# Patient Record
Sex: Male | Born: 1950 | Race: White | Hispanic: No | Marital: Married | Smoking: Former smoker
Health system: Southern US, Community
[De-identification: ages and names within clinical notes are randomized; demographics above are authoritative.]

## PROBLEM LIST (undated history)

## (undated) DIAGNOSIS — J4520 Mild intermittent asthma, uncomplicated: Secondary | ICD-10-CM

## (undated) DIAGNOSIS — Z87891 Personal history of nicotine dependence: Secondary | ICD-10-CM

## (undated) DIAGNOSIS — R0600 Dyspnea, unspecified: Secondary | ICD-10-CM

## (undated) DIAGNOSIS — M1990 Unspecified osteoarthritis, unspecified site: Secondary | ICD-10-CM

## (undated) DIAGNOSIS — R0609 Other forms of dyspnea: Secondary | ICD-10-CM

## (undated) DIAGNOSIS — E559 Vitamin D deficiency, unspecified: Secondary | ICD-10-CM

## (undated) DIAGNOSIS — I2510 Atherosclerotic heart disease of native coronary artery without angina pectoris: Secondary | ICD-10-CM

## (undated) DIAGNOSIS — E785 Hyperlipidemia, unspecified: Secondary | ICD-10-CM

## (undated) DIAGNOSIS — N138 Other obstructive and reflux uropathy: Secondary | ICD-10-CM

## (undated) DIAGNOSIS — N401 Enlarged prostate with lower urinary tract symptoms: Secondary | ICD-10-CM

## (undated) DIAGNOSIS — Z8679 Personal history of other diseases of the circulatory system: Secondary | ICD-10-CM

## (undated) DIAGNOSIS — N529 Male erectile dysfunction, unspecified: Secondary | ICD-10-CM

## (undated) DIAGNOSIS — I739 Peripheral vascular disease, unspecified: Secondary | ICD-10-CM

## (undated) DIAGNOSIS — I493 Ventricular premature depolarization: Secondary | ICD-10-CM

## (undated) DIAGNOSIS — I10 Essential (primary) hypertension: Secondary | ICD-10-CM

## (undated) DIAGNOSIS — K219 Gastro-esophageal reflux disease without esophagitis: Secondary | ICD-10-CM

## (undated) DIAGNOSIS — I255 Ischemic cardiomyopathy: Secondary | ICD-10-CM

## (undated) DIAGNOSIS — I700 Atherosclerosis of aorta: Secondary | ICD-10-CM

## (undated) DIAGNOSIS — F1011 Alcohol abuse, in remission: Secondary | ICD-10-CM

## (undated) HISTORY — DX: Essential (primary) hypertension: I10

## (undated) HISTORY — PX: TONSILLECTOMY: SUR1361

---

## 2010-04-15 DIAGNOSIS — Z7189 Other specified counseling: Secondary | ICD-10-CM | POA: Insufficient documentation

## 2012-10-04 ENCOUNTER — Ambulatory Visit: Payer: Self-pay | Admitting: Gastroenterology

## 2013-09-27 DIAGNOSIS — N529 Male erectile dysfunction, unspecified: Secondary | ICD-10-CM | POA: Insufficient documentation

## 2013-09-30 DIAGNOSIS — E559 Vitamin D deficiency, unspecified: Secondary | ICD-10-CM | POA: Insufficient documentation

## 2013-09-30 DIAGNOSIS — I10 Essential (primary) hypertension: Secondary | ICD-10-CM | POA: Insufficient documentation

## 2013-09-30 DIAGNOSIS — F1010 Alcohol abuse, uncomplicated: Secondary | ICD-10-CM | POA: Insufficient documentation

## 2013-09-30 DIAGNOSIS — E785 Hyperlipidemia, unspecified: Secondary | ICD-10-CM | POA: Insufficient documentation

## 2013-09-30 DIAGNOSIS — Z720 Tobacco use: Secondary | ICD-10-CM | POA: Insufficient documentation

## 2019-07-05 ENCOUNTER — Ambulatory Visit: Payer: Medicare HMO | Admitting: Urology

## 2019-08-02 ENCOUNTER — Encounter: Payer: Self-pay | Admitting: Urology

## 2019-08-02 ENCOUNTER — Ambulatory Visit: Payer: Medicare HMO | Admitting: Urology

## 2019-08-02 ENCOUNTER — Other Ambulatory Visit: Payer: Self-pay

## 2019-08-02 VITALS — BP 136/68 | HR 94 | Ht 72.0 in | Wt 217.0 lb

## 2019-08-02 DIAGNOSIS — R3912 Poor urinary stream: Secondary | ICD-10-CM

## 2019-08-02 DIAGNOSIS — N400 Enlarged prostate without lower urinary tract symptoms: Secondary | ICD-10-CM | POA: Diagnosis not present

## 2019-08-02 LAB — URINALYSIS, COMPLETE
Bilirubin, UA: NEGATIVE
Leukocytes,UA: NEGATIVE
Nitrite, UA: NEGATIVE
RBC, UA: NEGATIVE
Specific Gravity, UA: >1.03 — ABNORMAL HIGH (ref 1.005–1.030)
Urobilinogen, Ur: 1 mg/dL (ref 0.2–1.0)
pH, UA: 6 (ref 5.0–7.5)

## 2019-08-02 LAB — MICROSCOPIC EXAMINATION
Bacteria, UA: NONE SEEN
RBC, Urine: NONE SEEN /hpf (ref 0–2)

## 2019-08-02 LAB — BLADDER SCAN AMB NON-IMAGING: Scan Result: 0

## 2019-08-02 MED ORDER — TAMSULOSIN HCL 0.4 MG PO CAPS: 0.4000 mg | ORAL_CAPSULE | Freq: Every day | ORAL | 11 refills | Status: DC

## 2019-08-02 NOTE — Progress Notes (Signed)
08/02/2019 3:39 PM   Christian Pena [birth year] [id number]  Referring provider: Sallee Lange, NP [address] [place],  [place] [address]  Chief Complaint  Patient presents with  . Urinary Frequency    HPI: 69 year old male who presents today for further evaluation of voiding symptoms.  He reports over the past several years, he is having increasing difficulty with weak stream, slight straining with urination.  Gets up twice at night to void.  He does feel like he empties his bladder for the most part despite having to strain with weak flow.  No urinary tract infections.  No dysuria or gross hematuria.  He has discussed this with his primary care physician but never has been started on pharmacotherapy.  No history of urinary retention.  Most recent PSA 0.96 on 07/06/2018.  No PSAs in the past 12 months.  Urinalysis today is unremarkable.   IPSS    Row Name 08/02/19 1300         International Prostate Symptom Score   How often have you had the sensation of not emptying your bladder?  Less than 1 in 5     How often have you had to urinate less than every two hours?  Less than half the time     How often have you found you stopped and started again several times when you urinated?  Less than 1 in 5 times     How often have you found it difficult to postpone urination?  Not at All     How often have you had a weak urinary stream?  Almost always     How often have you had to strain to start urination?  Not at All     How many times did you typically get up at night to urinate?  2 Times     Total IPSS Score  11       Quality of Life due to urinary symptoms   If you were to spend the rest of your life with your urinary condition just the way it is now how would you feel about that?  Mostly Satisfied        Score:  1-7 Mild 8-19 Moderate 20-35 Severe   PMH: Past Medical History:  Diagnosis Date  . Hypertension     Surgical History: Past Surgical History:   Procedure Laterality Date  . NO PAST SURGERIES      Home Medications:  Allergies as of 08/02/2019   No Known Allergies     Medication List       Accurate as of August 02, 2019  3:39 PM. If you have any questions, ask your nurse or doctor.        aspirin 81 MG EC tablet Take by mouth.   lisinopril 20 MG tablet Commonly known as: ZESTRIL Take by mouth.   omeprazole 20 MG tablet Commonly known as: PRILOSEC OTC Take by mouth.   sildenafil 20 MG tablet Commonly known as: REVATIO Take by mouth.   tamsulosin 0.4 MG Caps capsule Commonly known as: Flomax Take 1 capsule (0.4 mg total) by mouth daily. Started by: Hollice Espy, MD       Allergies: No Known Allergies  Family History: History reviewed. No pertinent family history.  Social History:  reports that he has been smoking cigarettes. He has never used smokeless tobacco. He reports current alcohol use. No history on file for drug.  ROS: UROLOGY Frequent Urination?: No Hard to postpone urination?: No  Burning/pain with urination?: No Get up at night to urinate?: No Leakage of urine?: No Urine stream starts and stops?: No Trouble starting stream?: No Do you have to strain to urinate?: No Blood in urine?: No Urinary tract infection?: No Sexually transmitted disease?: No Injury to kidneys or bladder?: No Painful intercourse?: No Weak stream?: Yes Erection problems?: No Penile pain?: No  Gastrointestinal Nausea?: No Vomiting?: No Indigestion/heartburn?: No Diarrhea?: No Constipation?: No  Constitutional Fever: No Night sweats?: No Weight loss?: No Fatigue?: No  Skin Skin rash/lesions?: No Itching?: No  Eyes Blurred vision?: No Double vision?: No  Ears/Nose/Throat Sore throat?: No Sinus problems?: No  Hematologic/Lymphatic Swollen glands?: No Easy bruising?: No  Cardiovascular Leg swelling?: No Chest pain?: No  Respiratory Cough?: No Shortness of breath?:  No  Endocrine Excessive thirst?: No  Musculoskeletal Back pain?: No Joint pain?: No  Neurological Headaches?: No Dizziness?: No  Psychologic Depression?: No Anxiety?: No  Physical Exam: BP 136/68   Pulse 94   Ht 6' (1.829 m)   Wt 217 lb (98.4 kg)   BMI 29.43 kg/m   Constitutional:  Alert and oriented, No acute distress. HEENT: [place] AT, moist mucus membranes.  Trachea midline, no masses. Cardiovascular: No clubbing, cyanosis, or edema. Respiratory: Normal respiratory effort, no increased work of breathing. Rectal: Normal sphincter tone.  Relatively short prostate with large lateral lobes, 50+ cc gland.  No nodules.  No tenderness. Skin: No rashes, bruises or suspicious lesions. Neurologic: Grossly intact, no focal deficits, moving all 4 extremities. Psychiatric: Normal mood and affect.  Laboratory Data: PSA as above.  Creatinine 0.9 on 1/21.   Urinalysis UA today reviewed, unremarkable.  See epic.  Pertinent Imaging: Results for orders placed or performed in visit on 08/02/19  Bladder Scan (Post Void Residual) in office  Result Value Ref Range   Scan Result 0     Assessment & Plan:    1. Weak urinary stream AUA prostate cancer screening guidelines and recommendations reviewed, updated today with repeat PSA is pending.  Patient is adequately bothered by his weakening urinary stream consistent with bladder outlet obstruction, chronic and worsening  We discussed options including continued conservative management, pharmacotherapy including medication such as Flomax and finasteride along with outlet procedures at length.  Risk and benefits of each were discussed.  He is leaning toward surgical intervention but is hesitant to want to pursue this at this time.  He was given literature today on both TURP, UroLift, and holmium laser enucleation of the prostate is representative examples of outlet procedures today.  He will start thinking about this and received  literature.  If he wants to pursue any sort of outlet procedure, would recommend cystoscopy and transrectal sizing for more accurate counseling.  It is my time, he is interested in pursuing pharmacotherapy as a trial.  He like to try Flomax initially.  Possible side effects including orthostatic hypotension retrograde ejaculation amongst others were discussed.  Medication was sent to pharmacy.  We will plan to reassess his symptoms in 3 months.  He is agreeable this plan.  - Urinalysis, Complete - Bladder Scan (Post Void Residual) in office   2. 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  Hollice Espy, MD  [facility] [address] [place], [place] [address] [phone]

## 2019-08-02 NOTE — Patient Instructions (Signed)
Holmium Laser Enucleation of the Prostate (HoLEP)  HoLEP is a treatment for men with benign prostatic hyperplasia (BPH). The laser surgery removed blockages of urine flow, and is done without any incisions on the body.     What is HoLEP?  HoLEP is a type of laser surgery used to treat obstruction (blockage) of urine flow as a result of benign prostatic hyperplasia (BPH). In men with BPH, the prostate gland is not cancerous, but has become enlarged. An enlarged prostate can result in a number of urinary tract symptoms such as weak urinary stream, difficulty in starting urination, inability to urinate, frequent urination, or getting up at night to urinate.  HoLEP was developed in the 1990's as a more effective and less expensive surgical option for BPH, compared to other surgical options such as laser vaporization(PVP/greenlight laser), transurethral resection of the prostate(TURP), and open simple prostatectomy.   What happens during a HoLEP?  HoLEP requires general anesthesia ("asleep" throughout the procedure).   An antibiotic is given to reduce the risk of infection  A surgical instrument called a resectoscope is inserted through the urethra (the tube that carries urine from the bladder). The resectoscope has a camera that allows the surgeon to view the internal structure of the prostate gland, and to see where the incisions are being made during surgery.  The laser is inserted into the resectoscope and is used to enucleate (free up) the enlarged prostate tissue from the capsule (outer shell) and then to seal up any blood vessels. The tissue that has been removed is pushed back into the bladder.  A morcellator is placed through the resectoscope, and is used to suction out the prostate tissue that has been pushed into the bladder.  When the prostate tissue has been removed, the resectoscope is removed, and a foley catheter is placed to allow healing and drain the urine from the  bladder.     What happens after a HoLEP?  More than 90% of patients go home the same day a few hours after surgery. Less than 10% will be admitted to the hospital overnight for observation to monitor the urine, or if they have other medical problems.  Fluid is flushed through the catheter for about 1 hour after surgery to clear any blood from the urine. It is normal to have some blood in the urine after surgery. The need for blood transfusion is extremely rare.  Eating and drinking are permitted after the procedure once the patient has fully awakened from anesthesia.  The catheter is usually removed 2-3 days after surgery- the patient will come to clinic to have the catheter removed and make sure they can urinate on their own.  It is very important to drink lots of fluids after surgery for one week to keep the bladder flushed.  At first, there may be some burning with urination, but this typically improved within a few hours to days. Most patients do not have a significant amount of pain, and narcotic pain medications are rarely needed.  Symptoms of urinary frequency, urgency, and even leakage are NORMAL for the first few weeks after surgery as the bladder adjusts after having to work hard against blockage from the prostate for many years. This will improve, but can sometimes take several months.  The use of pelvic floor exercises (Kegel exercises) can help improve problems with urinary incontinence.   After catheter removal, patients will be seen at 6 weeks and 6 months for symptom check  No heavy lifting for   at least 2-3 weeks after surgery, however patients can walk and do light activities the first day after surgery. Return to work time depends on occupation.    What are the advantages of HoLEP?  HoLEP has been studied in many different parts of the world and has been shown to be a safe and effective procedure. Although there are many types of BPH surgeries available, HoLEP offers a  unique advantage in being able to remove a large amount of tissue without any incisions on the body, even in very large prostates, while decreasing the risk of bleeding and providing tissue for pathology (to look for cancer). This decreases the need for blood transfusions during surgery, minimizes hospital stay, and reduces the risk of needing repeat treatment.  What are the side effects of HoLEP?  Temporary burning and bleeding during urination. Some blood may be seen in the urine for weeks after surgery and is part of the healing process.  Urinary incontinence (inability to control urine flow) is expected in all patients immediately after surgery and they should wear pads for the first few days/weeks. This typically improves over the course of several weeks. Performing Kegel exercises can help decrease leakage from stress maneuvers such as coughing, sneezing, or lifting. The rate of long term leakage is very low. Patients may also have leakage with urgency and this may be treated with medication. The risk of urge incontinence can be dependent on several factors including age, prostate size, symptoms, and other medical problems.  Retrograde ejaculation or "backwards ejaculation." In 75% of cases, the patient will not see any fluid during ejaculation after surgery.  Erectile function is generally not significantly affected.   What are the risks of HoLEP?  Injury to the urethra or development of scar tissue at a later date  Injury to the capsule of the prostate (typically treated with longer catheterization).  Injury to the bladder or ureteral orifices (where the urine from the kidney drains out)  Infection of the bladder, testes, or kidneys  Return of urinary obstruction at a later date requiring another operation (<2%)  Need for blood transfusion or re-operation due to bleeding  Failure to relieve all symptoms and/or need for prolonged catheterization after surgery  5-15% of patients are  found to have previously undiagnosed prostate cancer in their specimen. Prostate cancer can be treated after HoLEP.  Standard risks of anesthesia including blood clots, heart attacks, etc  When should I call my doctor?  Fever over 101.3 degrees  Inability to urinate, or large blood clots in the urine      Transurethral Resection of the Prostate Transurethral resection of the prostate (TURP) is the removal (resection) of part of the gland that produces semen (prostate gland). This procedure is done to treat benign prostatic hyperplasia (BPH). BPH is an abnormal, noncancerous (benign) increase in the number of cells that make up the prostate tissue. BPH causes the prostate to get bigger. The enlarged prostate can push against or block the tube that drains urine from the bladder out of the body (urethra). BPH can affect normal urine flow by causing bladder infections, difficulty controlling bladder function, and difficulty emptying the bladder.  The goal of TURP is to remove enough prostate tissue to allow for a normal flow of urine. The procedure will allow you to empty your bladder more completely when you urinate so that you can urinate less often. In a transurethral resection, a thin telescope with a light, a tiny camera, and an electric cutting   edge (resectoscope) is passed through the urethra and into the prostate. The opening of the urethra is at the end of the penis. Tell a health care provider about:  Any allergies you have.  All medicines you are taking, including vitamins, herbs, eye drops, creams, and over-the-counter medicines.  Any problems you or family members have had with anesthetic medicines.  Any blood disorders you have.  Any surgeries you have had.  Any medical conditions you have.  Any prostate infections you have had. What are the risks? Generally, this is a safe procedure. However, problems may occur, including:  Infection.  Bleeding.  Allergic reactions  to medicines.  Damage to other structures or organs, such as: ? The urethra. ? The bladder. ? Muscles that surround the prostate.  Difficulty getting an erection.  Inability to control when you urinate (incontinence).  Scarring, which may cause problems with urine flow. What happens before the procedure? Medicines Ask your health care provider about:  Changing or stopping your regular medicines. This is especially important if you are taking diabetes medicines or blood thinners.  Taking medicines such as aspirin and ibuprofen. These medicines can thin your blood. Do not take these medicines unless your health care provider tells you to take them.  Taking over-the-counter medicines, vitamins, herbs, and supplements. Eating and drinking Follow instructions from your health care provider about eating and drinking, which may include:  8 hours before the procedure - stop eating heavy meals or foods, such as meat, fried foods, or fatty foods.  6 hours before the procedure - stop eating light meals or foods, such as toast or cereal.  6 hours before the procedure - stop drinking milk or drinks that contain milk.  2 hours before the procedure - stop drinking clear liquids. Staying hydrated Follow instructions from your health care provider about hydration, which may include:  Up to 2 hours before the procedure - you may continue to drink clear liquids, such as water, clear fruit juice, black coffee, and plain tea. General instructions  You may have a physical exam.  You may have a blood or urine sample taken.  Ask your health care provider what steps will be taken to help prevent infection. These may include: ? Washing skin with a germ-killing soap. ? Taking antibiotic medicine.  Plan to have someone take you home from the hospital or clinic. You may not be able to drive for up to 10 days after your procedure.  Plan to have a responsible adult care for you for at least 24 hours  after you leave the hospital or clinic. This is important. What happens during the procedure?   An IV will be inserted into one of your veins.  You will be given one or more of the following: ? A medicine to help you relax (sedative). ? A medicine to make you fall asleep (general anesthetic). ? A medicine that is injected into your spine to numb the area below and slightly above the injection site (spinal anesthetic).  Your legs will be placed in foot rests (stirrups) so that your legs are apart and your knees are bent.  The resectoscope will be passed through your urethra to your prostate.  Parts of your prostate will be resected using the cutting edge of the resectoscope.  The resectoscope will be removed.  A small, thin tube (catheter) will be passed through your urethra and into your bladder. The catheter will drain urine into a bag outside of your body. ? Fluid   may be passed through the catheter to keep the catheter open. The procedure may vary among health care providers and hospitals. What happens after the procedure?  Your blood pressure, heart rate, breathing rate, and blood oxygen level will be monitored until you leave the hospital or clinic.  You may continue to receive fluids and medicines through an IV.  You may have some pain. Pain medicine will be available to help you.  You will have a catheter draining your urine. ? You may have blood in your urine. Your catheter may be kept in until your urine is clear. ? Your urinary drainage will be monitored. If necessary, your bladder may be rinsed out (irrigated) through your catheter.  You will be encouraged to walk around as soon as possible.  You may have to wear compression stockings. These stockings help prevent blood clots and reduce swelling in your legs.  Do not drive for 24 hours if you were given a sedative during your procedure. Summary  Transurethral resection of the prostate (TURP) is the removal  (resection) of part of the gland that produces semen (prostate gland).  The goal of this procedure is to remove enough prostate tissue to allow for a normal flow of urine.  Follow instructions from your health care provider about taking medicines and about eating and drinking before the procedure. This information is not intended to replace advice given to you by your health care provider. Make sure you discuss any questions you have with your health care provider. Document Revised: 10/20/2018 Document Reviewed: 03/31/2018 Elsevier Patient Education  2020 [employer]  

## 2019-08-03 LAB — PSA: Prostate Specific Ag, Serum: 1.1 ng/mL (ref 0.0–4.0)

## 2019-08-05 ENCOUNTER — Telehealth: Payer: Self-pay | Admitting: *Deleted

## 2019-08-05 NOTE — Telephone Encounter (Addendum)
Patient notified-voiced understanding.   ----- Message from Hollice Espy, MD sent at 08/05/2019 12:20 PM EST ----- PSA 1.1 which is excellent.

## 2019-08-18 DIAGNOSIS — I714 Abdominal aortic aneurysm, without rupture, unspecified: Secondary | ICD-10-CM

## 2019-08-18 HISTORY — DX: Abdominal aortic aneurysm, without rupture, unspecified: I71.40

## 2019-10-31 NOTE — Progress Notes (Signed)
11/01/19 10:17 AM   Christian Pena 1951/03/22 [id number]  Referring provider: Sallee Lange, NP [address] [place],  [place] [address]  Chief Complaint  Patient presents with  . Follow-up    HPI: Christian Pena is a 69 y.o. who returns today for cysto/TRUS for the evaluation and management of BPH w/o urinary obstruction however has opted out of cysto.   On last visit, he reported of having increased difficulty with weak stream, slight straining with urination and nocturia x2. He was started on Flomax at that time.   Today, he reports of doing well symptomatically on Flomax however reports of dizziness when standing up for prolonged periods. He has been taking Flomax during the day.   He would like to defer surgical intervention till later since he has another major surgery coming up.   No hx of urinary retention.   IPSS    Row Name 11/01/19 1100         International Prostate Symptom Score   How often have you had the sensation of not emptying your bladder?  Not at All     How often have you had to urinate less than every two hours?  Less than 1 in 5 times     How often have you found you stopped and started again several times when you urinated?  Less than 1 in 5 times     How often have you found it difficult to postpone urination?  Not at All     How often have you had a weak urinary stream?  Less than 1 in 5 times     How often have you had to strain to start urination?  Not at All     How many times did you typically get up at night to urinate?  None     Total IPSS Score  3       Quality of Life due to urinary symptoms   If you were to spend the rest of your life with your urinary condition just the way it is now how would you feel about that?  Mixed        Score:  1-7 Mild 8-19 Moderate 20-35 Severe  PMH: Past Medical History:  Diagnosis Date  . Hypertension     Surgical History: Past Surgical History:  Procedure Laterality Date  . NO PAST  SURGERIES      Home Medications:  Allergies as of 11/01/2019   No Known Allergies     Medication List       Accurate as of November 01, 2019 11:59 PM. If you have any questions, ask your nurse or doctor.        aspirin 81 MG EC tablet Take by mouth.   lisinopril 20 MG tablet Commonly known as: ZESTRIL Take by mouth.   omeprazole 20 MG tablet Commonly known as: PRILOSEC OTC Take by mouth.   sildenafil 20 MG tablet Commonly known as: REVATIO Take by mouth.   tamsulosin 0.4 MG Caps capsule Commonly known as: Flomax Take 1 capsule (0.4 mg total) by mouth daily.       Allergies: No Known Allergies  Family History: No family history on file.  Social History:  reports that he has been smoking cigarettes. He has never used smokeless tobacco. He reports current alcohol use. No history on file for drug.   Physical Exam: BP 124/70   Pulse 90   Ht 6' (1.829 m)   Wt 217  lb (98.4 kg)   BMI 29.43 kg/m   Constitutional:  Alert and oriented, No acute distress. HEENT: [place] AT, moist mucus membranes.  Trachea midline, no masses. Cardiovascular: No clubbing, cyanosis, or edema. Respiratory: Normal respiratory effort, no increased work of breathing. Skin: No rashes, bruises or suspicious lesions. Neurologic: Grossly intact, no focal deficits, moving all 4 extremities. Psychiatric: Normal mood and affect.  Assessment & Plan:    1. Weak urinary stream  Symptoms well controlled on Flomax Continue Flomax. Possible side effects discussed including orthostatic hypotension retrograde ejaculation amongst others were discussed. Advised to take medication at night. He deferred outlet procedure given another major surgery. Would like to consider UroLift in future.  Return for cysto/TRUS during winter.   2. BPH w/o urinary obstruction  As above   Return for Cysto/trus.  [facility] [address], [place], [place] [address] [phone]  I,  Christian Pena, am acting as a scribe for Dr. Hollice Espy,  I have reviewed the above documentation for accuracy and completeness, and I agree with the above.   Hollice Espy, MD

## 2019-11-01 ENCOUNTER — Encounter: Payer: Self-pay | Admitting: Urology

## 2019-11-01 ENCOUNTER — Other Ambulatory Visit: Payer: Self-pay

## 2019-11-01 ENCOUNTER — Ambulatory Visit (INDEPENDENT_AMBULATORY_CARE_PROVIDER_SITE_OTHER): Payer: Medicare HMO | Admitting: Urology

## 2019-11-01 VITALS — BP 124/70 | HR 90 | Ht 72.0 in | Wt 217.0 lb

## 2019-11-01 DIAGNOSIS — N400 Enlarged prostate without lower urinary tract symptoms: Secondary | ICD-10-CM | POA: Diagnosis not present

## 2020-02-02 ENCOUNTER — Encounter: Payer: Self-pay | Admitting: Emergency Medicine

## 2020-02-02 ENCOUNTER — Emergency Department
Admission: EM | Admit: 2020-02-02 | Discharge: 2020-02-02 | Disposition: A | Discharge status: Home / Self Care | Payer: Medicare HMO | Attending: Emergency Medicine | Admitting: Emergency Medicine

## 2020-02-02 ENCOUNTER — Other Ambulatory Visit: Payer: Self-pay

## 2020-02-02 DIAGNOSIS — W312XXA Contact with powered woodworking and forming machines, initial encounter: Secondary | ICD-10-CM | POA: Diagnosis not present

## 2020-02-02 DIAGNOSIS — Z79899 Other long term (current) drug therapy: Secondary | ICD-10-CM | POA: Insufficient documentation

## 2020-02-02 DIAGNOSIS — F17210 Nicotine dependence, cigarettes, uncomplicated: Secondary | ICD-10-CM | POA: Insufficient documentation

## 2020-02-02 DIAGNOSIS — Y929 Unspecified place or not applicable: Secondary | ICD-10-CM | POA: Diagnosis not present

## 2020-02-02 DIAGNOSIS — Y939 Activity, unspecified: Secondary | ICD-10-CM | POA: Insufficient documentation

## 2020-02-02 DIAGNOSIS — S61315A Laceration without foreign body of left ring finger with damage to nail, initial encounter: Secondary | ICD-10-CM

## 2020-02-02 DIAGNOSIS — Y999 Unspecified external cause status: Secondary | ICD-10-CM | POA: Insufficient documentation

## 2020-02-02 DIAGNOSIS — S61313A Laceration without foreign body of left middle finger with damage to nail, initial encounter: Secondary | ICD-10-CM

## 2020-02-02 DIAGNOSIS — S61213A Laceration without foreign body of left middle finger without damage to nail, initial encounter: Secondary | ICD-10-CM | POA: Diagnosis present

## 2020-02-02 DIAGNOSIS — S61215A Laceration without foreign body of left ring finger without damage to nail, initial encounter: Secondary | ICD-10-CM | POA: Diagnosis not present

## 2020-02-02 DIAGNOSIS — I10 Essential (primary) hypertension: Secondary | ICD-10-CM | POA: Diagnosis not present

## 2020-02-02 MED ORDER — OXYCODONE-ACETAMINOPHEN 5-325 MG PO TABS
1.0000 | ORAL_TABLET | Freq: Once | ORAL | Status: AC
  Administered 2020-02-02: 1 via ORAL
  Filled 2020-02-02: qty 1

## 2020-02-02 MED ORDER — BACITRACIN-NEOMYCIN-POLYMYXIN 400-5-5000 EX OINT
TOPICAL_OINTMENT | Freq: Once | CUTANEOUS | Status: AC
  Administered 2020-02-02: 1 via TOPICAL
  Filled 2020-02-02: qty 1

## 2020-02-02 MED ORDER — LIDOCAINE HCL (PF) 1 % IJ SOLN
10.0000 mL | Freq: Once | INTRAMUSCULAR | Status: AC
  Administered 2020-02-02: 10 mL
  Filled 2020-02-02: qty 10

## 2020-02-02 NOTE — ED Provider Notes (Signed)
[facility] Emergency Department Provider Note   ____________________________________________   First MD Initiated Contact with Patient 02/02/20 1222     (approximate)  I have reviewed the triage vital signs and the nursing notes.   HISTORY  Chief Complaint Laceration    HPI Christian Pena is a 69 y.o. 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  Nail involvement of the fourth digit.  Patient denies loss sensation or loss of function.  Patient tetanus shot is up-to-date.  Patient rates pain as a 5/10.  Patient described the pain as "sore".  Pressure dressing applied in triage.         Past Medical History:  Diagnosis Date  . Hypertension     There are no problems to display for this patient.   Past Surgical History:  Procedure Laterality Date  . NO PAST SURGERIES      Prior to Admission medications   Medication Sig Start Date End Date Taking? Authorizing Provider  aspirin 81 MG EC tablet Take by mouth.    [provider]  lisinopril (ZESTRIL) 20 MG tablet Take by mouth. 02/07/19   [provider]  omeprazole (PRILOSEC OTC) 20 MG tablet Take by mouth.    [provider]  sildenafil (REVATIO) 20 MG tablet Take by mouth. 10/21/18   [provider]  tamsulosin (FLOMAX) 0.4 MG CAPS capsule Take 1 capsule (0.4 mg total) by mouth daily. 08/02/19   Hollice Espy, MD    Allergies Patient has no known allergies.  No family history on file.  Social History Social History   Tobacco Use  . Smoking status: Current Some Day Smoker    Types: Cigarettes  . Smokeless tobacco: Never Used  Substance Use Topics  . Alcohol use: Yes  . Drug use: Not on file    Review of Systems Constitutional: No fever/chills Eyes: No visual changes. ENT: No sore throat. Cardiovascular: Denies chest pain. Respiratory: Denies shortness of breath. Gastrointestinal: No  abdominal pain.  No nausea, no vomiting.  No diarrhea.  No constipation. Genitourinary: Negative for dysuria. Musculoskeletal: Negative for back pain. Skin: Negative for rash.  Laceration distal third and fourth digit left hand. Neurological: Negative for headaches, focal weakness or numbness. Endocrine:  Hypertension Hematological/Lymphatic:  Allergic/Immunilogical: **}  ____________________________________________   PHYSICAL EXAM:  VITAL SIGNS: ED Triage Vitals  Enc Vitals Group     BP 02/02/20 1210 (!) 150/93     Pulse Rate 02/02/20 1210 95     Resp 02/02/20 1210 19     Temp 02/02/20 1210 98.5 F (36.9 C)     Temp Source 02/02/20 1210 Oral     SpO2 02/02/20 1210 97 %     Weight 02/02/20 1206 (!) 216 lb 14.9 oz (98.4 kg)     Height 02/02/20 1206 6' (1.829 m)     Head Circumference --      Peak Flow --      Pain Score 02/02/20 1205 5     Pain Loc --      Pain Edu? --      Excl. in [place]? --    Constitutional: Alert and oriented. Well appearing and in no acute distress. Cardiovascular: Normal rate, regular rhythm. Grossly normal heart sounds.  Good peripheral circulation.  Elevated blood pressure. Respiratory: Normal respiratory effort.  No retractions. Lungs CTAB. Skin: Laceration to the distal third and fourth digit left hand.   Psychiatric: Mood and  affect are normal. Speech and behavior are normal.  ____________________________________________   LABS (all labs ordered are listed, but only abnormal results are displayed)  Labs Reviewed - No data to display ____________________________________________  EKG   ____________________________________________  RADIOLOGY  ED MD interpretation:    Official radiology report(s): No results found.  ____________________________________________   PROCEDURES  Procedure(s) performed (including Critical Care):  Marland KitchenMarland KitchenLaceration Repair  Date/Time: 02/02/2020 1:30 PM Performed by: Sable Feil, PA-C Authorized by:  Sable Feil, PA-C   Consent:    Consent obtained:  Verbal   Consent given by:  Patient   Risks discussed:  Infection, pain, poor cosmetic result and need for additional repair Anesthesia (see MAR for exact dosages):    Anesthesia method:  Nerve block   Block needle gauge:  25 G   Block anesthetic:  Lidocaine 1% w/o epi   Block injection procedure:  Anatomic landmarks identified, introduced needle and incremental injection   Block outcome:  Anesthesia achieved Laceration details:    Location:  Finger   Finger location:  L long finger   Length (cm):  0.3   Depth (mm):  2 Repair type:    Repair type:  Simple Pre-procedure details:    Preparation:  Patient was prepped and draped in usual sterile fashion Exploration:    Contaminated: no   Treatment:    Area cleansed with:  Betadine and saline   Amount of cleaning:  Standard Skin repair:    Repair method:  Sutures   Suture size:  4-0   Suture material:  Prolene   Suture technique:  Simple interrupted Approximation:    Approximation:  Close Post-procedure details:    Dressing:  Antibiotic ointment, splint for protection and sterile dressing   Patient tolerance of procedure:  Tolerated well, no immediate complications .Marland KitchenLaceration Repair  Date/Time: 02/02/2020 1:33 PM Performed by: Sable Feil, PA-C Authorized by: Sable Feil, PA-C   Consent:    Consent obtained:  Verbal   Consent given by:  Patient   Risks discussed:  Infection, pain and poor cosmetic result Anesthesia (see MAR for exact dosages):    Anesthesia method:  Nerve block   Block needle gauge:  25 G   Block anesthetic:  Lidocaine 1% w/o epi   Block injection procedure:  Anatomic landmarks identified and incremental injection Laceration details:    Location:  Finger   Finger location:  L ring finger   Length (cm):  0.3   Depth (mm):  2 Repair type:    Repair type:  Simple Pre-procedure details:    Preparation:  Patient was prepped and draped in  usual sterile fashion Treatment:    Area cleansed with:  Betadine and saline   Amount of cleaning:  Standard Skin repair:    Repair method:  Sutures   Suture size:  4-0   Suture technique:  Simple interrupted   Number of sutures:  4 Approximation:    Approximation:  Close Post-procedure details:    Dressing:  Antibiotic ointment, splint for protection and sterile dressing   Patient tolerance of procedure:  Tolerated well, no immediate complications     ____________________________________________   INITIAL IMPRESSION / ASSESSMENT AND PLAN / ED COURSE  As part of my medical decision making, I reviewed the following data within the [place]     Patient presents with laceration to the third and fourth digit left hand.  See procedure note for wound closure.  Patient given discharge care instruction advised  return back in 10 days for suture removal.    Christian Pena was evaluated in Emergency Department on 02/02/2020 for the symptoms described in the history of present illness. He was evaluated in the context of the global COVID-19 pandemic, which necessitated consideration that the patient might be at risk for infection with the SARS-CoV-2 virus that causes COVID-19. Institutional protocols and algorithms that pertain to the evaluation of patients at risk for COVID-19 are in a state of rapid change based on information released by regulatory bodies including the CDC and federal and state organizations. These policies and algorithms were followed during the patient's care in the ED.       ____________________________________________   FINAL CLINICAL IMPRESSION(S) / ED DIAGNOSES  Final diagnoses:  Laceration of left middle finger without foreign body with damage to nail, initial encounter  Laceration of left ring finger without foreign body with damage to nail, initial encounter     ED Discharge Orders    None       Note:  This document was prepared  using Dragon voice recognition software and may include unintentional dictation errors.    Sable Feil, PA-C 02/02/20 1339    Carrie Mew, MD 02/02/20 [phone]

## 2020-02-02 NOTE — ED Notes (Signed)
Wounds dressed with non adhesive bandage and wrap with finger splint to protect wounds.

## 2020-02-02 NOTE — ED Notes (Signed)
Pt states he accidentally cut the tips of his left 3rd and 4th fingers with a table saw today. Bleeding is controlled. Pt arrives to the room with clean bandages intact.

## 2020-02-02 NOTE — Discharge Instructions (Addendum)
Follow discharge care instruction and have sutures removed in 10 days.

## 2020-02-02 NOTE — ED Triage Notes (Addendum)
Left hand laceration.  Cut middle and fourth with table saw.  Bleeding controlled.

## 2020-02-13 ENCOUNTER — Encounter: Payer: Self-pay | Admitting: Emergency Medicine

## 2020-02-13 ENCOUNTER — Other Ambulatory Visit: Payer: Self-pay

## 2020-02-13 ENCOUNTER — Emergency Department
Admission: EM | Admit: 2020-02-13 | Discharge: 2020-02-13 | Disposition: A | Discharge status: Home / Self Care | Payer: Medicare HMO | Attending: Emergency Medicine | Admitting: Emergency Medicine

## 2020-02-13 DIAGNOSIS — Z7982 Long term (current) use of aspirin: Secondary | ICD-10-CM | POA: Diagnosis not present

## 2020-02-13 DIAGNOSIS — I10 Essential (primary) hypertension: Secondary | ICD-10-CM | POA: Diagnosis not present

## 2020-02-13 DIAGNOSIS — Z4802 Encounter for removal of sutures: Secondary | ICD-10-CM | POA: Diagnosis not present

## 2020-02-13 DIAGNOSIS — Z79899 Other long term (current) drug therapy: Secondary | ICD-10-CM | POA: Diagnosis not present

## 2020-02-13 DIAGNOSIS — F17210 Nicotine dependence, cigarettes, uncomplicated: Secondary | ICD-10-CM | POA: Insufficient documentation

## 2020-02-13 MED ORDER — BACITRACIN-NEOMYCIN-POLYMYXIN 400-5-5000 EX OINT: TOPICAL_OINTMENT | Freq: Once | CUTANEOUS | Status: DC

## 2020-02-13 NOTE — ED Triage Notes (Signed)
Pt reports here to have sutures removed. No concern for infection.

## 2020-02-13 NOTE — ED Notes (Signed)
See triage note  Presents for suture removal to fingers on left

## 2020-02-13 NOTE — ED Provider Notes (Signed)
[facility] Emergency Department Provider Note   ____________________________________________   First MD Initiated Contact with Patient 02/13/20 [phone]     (approximate)  I have reviewed the triage vital signs and the nursing notes.   HISTORY  Chief Complaint Suture / Staple Removal    HPI Christian Pena is a 69 y.o. male patient presents for suture removal status post laceration to finger 10 days ago.  Voices no complaints.         Past Medical History:  Diagnosis Date  . Hypertension     There are no problems to display for this patient.   Past Surgical History:  Procedure Laterality Date  . NO PAST SURGERIES      Prior to Admission medications   Medication Sig Start Date End Date Taking? Authorizing Provider  aspirin 81 MG EC tablet Take by mouth.    [provider]  lisinopril (ZESTRIL) 20 MG tablet Take by mouth. 02/07/19   [provider]  omeprazole (PRILOSEC OTC) 20 MG tablet Take by mouth.    [provider]  sildenafil (REVATIO) 20 MG tablet Take by mouth. 10/21/18   [provider]  tamsulosin (FLOMAX) 0.4 MG CAPS capsule Take 1 capsule (0.4 mg total) by mouth daily. 08/02/19   Hollice Espy, MD    Allergies Patient has no known allergies.  No family history on file.  Social History Social History   Tobacco Use  . Smoking status: Current Some Day Smoker    Types: Cigarettes  . Smokeless tobacco: Never Used  Substance Use Topics  . Alcohol use: Yes  . Drug use: Not on file    Review of Systems Constitutional: No fever/chills Eyes: No visual changes. ENT: No sore throat. Cardiovascular: Denies chest pain. Respiratory: Denies shortness of breath. Gastrointestinal: No abdominal pain.  No nausea, no vomiting.  No diarrhea.  No constipation. Genitourinary: Negative for dysuria. Musculoskeletal: Negative for back pain. Skin: Laceration to the second third digit left  hand. Neurological: Negative for headaches, focal weakness or numbness. Endocrine:  Hypertension  ____________________________________________   PHYSICAL EXAM:  VITAL SIGNS: ED Triage Vitals  Enc Vitals Group     BP 02/13/20 0739 (!) 171/102     Pulse Rate 02/13/20 0739 73     Resp 02/13/20 0739 20     Temp 02/13/20 0739 97.9 F (36.6 C)     Temp Source 02/13/20 0739 Oral     SpO2 02/13/20 0739 98 %     Weight 02/13/20 0734 (!) 215 lb (97.5 kg)     Height 02/13/20 0734 6' (1.829 m)     Head Circumference --      Peak Flow --      Pain Score 02/13/20 0734 0     Pain Loc --      Pain Edu? --      Excl. in [place]? --     Constitutional: Alert and oriented. Well appearing and in no acute distress. Cardiovascular: Normal rate, regular rhythm. Grossly normal heart sounds.  Good peripheral circulation.  Elevated blood pressure. Respiratory: Normal respiratory effort.  No retractions. Lungs CTAB. Skin:  Skin is warm, dry and intact. No rash noted. Psychiatric: Mood and affect are normal. Speech and behavior are normal.  ____________________________________________   LABS (all labs ordered are listed, but only abnormal results are displayed)  Labs Reviewed - No data to display ____________________________________________  EKG   ____________________________________________  RADIOLOGY  ED MD interpretation:    Official  radiology report(s): No results found.  ____________________________________________   PROCEDURES  Procedure(s) performed (including Critical Care):  .Suture Removal  Date/Time: 02/13/2020 8:16 AM Performed by: Sable Feil, PA-C Authorized by: Sable Feil, PA-C   Consent:    Consent obtained:  Verbal   Consent given by:  Patient   Risks discussed:  Bleeding, pain and wound separation Location:    Location:  Upper extremity   Upper extremity location:  Hand   Hand location:  L index finger and L long finger Procedure details:    Wound  appearance:  No signs of infection, good wound healing and clean   Number of sutures removed:  7 Post-procedure details:    Post-removal:  Antibiotic ointment applied and dressing applied   Patient tolerance of procedure:  Tolerated well, no immediate complications     ____________________________________________   INITIAL IMPRESSION / ASSESSMENT AND PLAN / ED COURSE  As part of my medical decision making, I reviewed the following data within the [place]     Patient presents for suture removal second and third digit left hand status post 10 days.  Patient was no concerns.  See procedure note.  Patient given discharge care instruction advised follow-up as needed.    Christian Pena was evaluated in Emergency Department on 02/13/2020 for the symptoms described in the history of present illness. He was evaluated in the context of the global COVID-19 pandemic, which necessitated consideration that the patient might be at risk for infection with the SARS-CoV-2 virus that causes COVID-19. Institutional protocols and algorithms that pertain to the evaluation of patients at risk for COVID-19 are in a state of rapid change based on information released by regulatory bodies including the CDC and federal and state organizations. These policies and algorithms were followed during the patient's care in the ED.       ____________________________________________   FINAL CLINICAL IMPRESSION(S) / ED DIAGNOSES  Final diagnoses:  Visit for suture removal     ED Discharge Orders    None       Note:  This document was prepared using Dragon voice recognition software and may include unintentional dictation errors.    Sable Feil, PA-C 02/13/20 0818    Lucrezia Starch, MD 02/13/20 [phone]

## 2020-04-05 ENCOUNTER — Encounter: Payer: Self-pay | Admitting: Urology

## 2020-06-12 ENCOUNTER — Other Ambulatory Visit: Payer: Self-pay

## 2020-06-12 ENCOUNTER — Encounter: Payer: Self-pay | Admitting: Urology

## 2020-06-12 ENCOUNTER — Ambulatory Visit (INDEPENDENT_AMBULATORY_CARE_PROVIDER_SITE_OTHER): Payer: Medicare HMO | Admitting: Urology

## 2020-06-12 VITALS — BP 89/52 | HR 97 | Ht 72.0 in | Wt 215.0 lb

## 2020-06-12 DIAGNOSIS — D494 Neoplasm of unspecified behavior of bladder: Secondary | ICD-10-CM

## 2020-06-12 DIAGNOSIS — N400 Enlarged prostate without lower urinary tract symptoms: Secondary | ICD-10-CM

## 2020-06-12 NOTE — Progress Notes (Signed)
06/12/20  Chief Complaint  Patient presents with  . Cysto    TRUS     HPI: 69 year old male with refractory urinary symptoms related to BPH who presents today to the office for cystoscopy and prostate sizing.  He is currently on Flomax with improvement in his urinary symptoms although he occasionally has orthostatic hypotension and dizziness of Flomax and prefer to be off of this medication.   Please see previous notes for details.     There were no vitals taken for this visit. NED. A&Ox3.   No respiratory distress   Abd soft, NT, ND Normal phallus with bilateral descended testicles    Cystoscopy Procedure Note  Patient identification was confirmed, informed consent was obtained, and patient was prepped using Betadine solution.  Lidocaine jelly was administered per urethral meatus.    Preoperative abx where received prior to procedure.     Pre-Procedure: - Inspection reveals a normal caliber ureteral meatus.  Procedure: The flexible cystoscope was introduced without difficulty - No urethral strictures/lesions are present. - Enlarged prostate with speckled calcification along the length of mucosa -Irregular bladder neck which is elevated at the 7 o'clock position with a pedunculated mass initially felt to be a median lobe but then on retroflexion, appears to be 1 cm pedunculated bladder tumor either arising from the bladder neck itself or mucosal overlying the prostate with ball-valve effect into the bladder - Bilateral ureteral orifices identified - Bladder mucosa normal other than bladder tumor as above - No bladder stones - No trabeculation   Post-Procedure: - Patient tolerated the procedure well    Prostate transrectal ultrasound sizing   Informed consent was obtained after discussing risks/benefits of the procedure.  A time out was performed to ensure correct patient identity.   Pre-Procedure: -Transrectal probe was placed without difficulty -Transrectal  Ultrasound performed revealing a 54.98 gm prostate measuring 5.61 x 3.93 x 4.76 cm (length) -No significant hypoechoic or median lobe noted, intravesical bladder neck mass appreciated on TRUS     Assessment/ Plan:  1. BPH with urinary obstruction Symptoms fairly reasonably well controlled on Flomax although has occasional breakthrough symptoms based on discussion today  He still interested in UroLift but ball-valve effect from bladder mass may be source of his urinary symptoms.  Will hold off on outlet procedure until we have adjusted below. 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  Recommend TURBT with bilateral retrograde given lack of upper tract imaging and instillation of intravesical gemcitabine.  Risk of bleeding infection damage surrounding structures amongst others were discussed.  We will also plan for renal ultrasound for upper tract imaging.    Preoperative UA/urine culture.  All questions answered. - US RENAL; Future   Hollice Espy, MD

## 2020-06-13 DIAGNOSIS — C675 Malignant neoplasm of bladder neck: Secondary | ICD-10-CM | POA: Insufficient documentation

## 2020-06-15 LAB — URINALYSIS, COMPLETE
Bilirubin, UA: NEGATIVE
Glucose, UA: NEGATIVE
Leukocytes,UA: NEGATIVE
Nitrite, UA: NEGATIVE
RBC, UA: NEGATIVE
Specific Gravity, UA: 1.025 (ref 1.005–1.030)
Urobilinogen, Ur: 1 mg/dL (ref 0.2–1.0)
pH, UA: 6 (ref 5.0–7.5)

## 2020-06-15 LAB — CULTURE, URINE COMPREHENSIVE

## 2020-06-15 LAB — MICROSCOPIC EXAMINATION: Bacteria, UA: NONE SEEN

## 2020-06-25 ENCOUNTER — Other Ambulatory Visit: Payer: Self-pay | Admitting: Radiology

## 2020-06-26 ENCOUNTER — Other Ambulatory Visit: Payer: Self-pay | Admitting: Radiology

## 2020-06-26 DIAGNOSIS — D494 Neoplasm of unspecified behavior of bladder: Secondary | ICD-10-CM

## 2020-07-02 ENCOUNTER — Other Ambulatory Visit: Payer: Self-pay

## 2020-07-02 ENCOUNTER — Ambulatory Visit
Admission: RE | Admit: 2020-07-02 | Discharge: 2020-07-02 | Disposition: A | Discharge status: Home / Self Care | Payer: Medicare HMO | Source: Ambulatory Visit | Attending: Urology | Admitting: Urology

## 2020-07-02 DIAGNOSIS — N400 Enlarged prostate without lower urinary tract symptoms: Secondary | ICD-10-CM | POA: Diagnosis not present

## 2020-07-02 DIAGNOSIS — D494 Neoplasm of unspecified behavior of bladder: Secondary | ICD-10-CM | POA: Diagnosis present

## 2020-07-02 DIAGNOSIS — R161 Splenomegaly, not elsewhere classified: Secondary | ICD-10-CM | POA: Diagnosis not present

## 2020-07-09 ENCOUNTER — Other Ambulatory Visit: Payer: Self-pay | Admitting: Family Medicine

## 2020-07-09 DIAGNOSIS — D494 Neoplasm of unspecified behavior of bladder: Secondary | ICD-10-CM

## 2020-07-10 ENCOUNTER — Encounter
Admission: RE | Admit: 2020-07-10 | Discharge: 2020-07-10 | Disposition: A | Discharge status: Home / Self Care | Payer: Medicare HMO | Source: Ambulatory Visit | Attending: Urology | Admitting: Urology

## 2020-07-10 ENCOUNTER — Other Ambulatory Visit: Payer: Self-pay

## 2020-07-10 HISTORY — DX: Gastro-esophageal reflux disease without esophagitis: K21.9

## 2020-07-10 NOTE — Patient Instructions (Signed)
Your procedure is scheduled on: Thursday 07/19/20.  Report to THE FIRST FLOOR REGISTRATION DESK IN THE MEDICAL MALL ON THE MORNING OF SURGERY FIRST, THEN YOU WILL CHECK IN AT THE SURGERY INFORMATION DESK LOCATED OUTSIDE THE SAME DAY SURGERY DEPARTMENT LOCATED ON 2ND FLOOR MEDICAL MALL ENTRANCE.  To find out your arrival time please call [phone] between 1PM - 3PM on Wednesday 07/18/20.   Remember: Instructions that are not followed completely may result in serious medical risk, up to and including death, or upon the discretion of your surgeon and anesthesiologist your surgery may need to be rescheduled.     __X__ 1. Do not eat or drink anything after midnight the night before your procedure except for a sip of water with your morning medications 2 hours before your arrival time.                  __X__2.  On the morning of surgery brush your teeth with toothpaste and water, you may rinse your mouth with mouthwash if you wish.  Do not swallow any toothpaste or mouthwash.    __X__ 3.  No Alcohol for 24 hours before or after surgery.  __X__ 4.  Do Not Smoke or use e-cigarettes For 24 Hours Prior to Your Surgery.                 Do not use any chewable tobacco products for at least 6 hours prior to                 surgery.  __X__5.  Notify your doctor if there is any change in your medical condition      (cold, fever, infections).      Do NOT wear jewelry, make-up, hairpins, clips or nail polish. Do NOT wear lotions, powders, or perfumes.  Do NOT shave 48 hours prior to surgery. Men may shave face and neck. Do NOT bring valuables to the hospital.     [facility] is not responsible for any belongings or valuables.   Contacts, dentures/partials or body piercings may not be worn into surgery. Bring a case for your contacts, glasses or hearing aids, a denture cup will be supplied.    Patients discharged the day of surgery will not be allowed to drive home.     __X__ Take these  medicines the morning of surgery with A SIP OF WATER:     1. omeprazole (PRILOSEC OTC)     __X__ Stop Blood Thinners: Aspirin. You were directed to stop this medication on 07/12/20.  __X__ Stop Anti-inflammatories 7 days before surgery such as Advil, Ibuprofen, Motrin, BC or Goodies Powder, Naprosyn, Naproxen, Aleve, Aspirin, Meloxicam. May take Tylenol if needed for pain or discomfort.   __X__Do not start taking any new herbal supplements or vitamins prior to your procedure.     Wear comfortable clothing (specific to your surgery type) to the hospital.  Plan for stool softeners for home use; pain medications have a tendency to cause constipation. You can also help prevent constipation by eating foods high in fiber such as fruits and vegetables and drinking plenty of fluids as your diet allows.  After surgery, you can prevent lung complications by doing breathing exercises.Take deep breaths and cough every 1-2 hours. Your doctor may order a device called an Incentive Spirometer to help you take deep breaths.  Please call the Pre-Admissions Testing Department at [phone] if you have any questions about these instructions.

## 2020-07-11 ENCOUNTER — Other Ambulatory Visit: Payer: Medicare HMO

## 2020-07-11 ENCOUNTER — Encounter
Admission: RE | Admit: 2020-07-11 | Discharge: 2020-07-11 | Disposition: A | Discharge status: Home / Self Care | Payer: Medicare HMO | Source: Ambulatory Visit | Attending: Urology | Admitting: Urology

## 2020-07-11 DIAGNOSIS — D494 Neoplasm of unspecified behavior of bladder: Secondary | ICD-10-CM

## 2020-07-11 DIAGNOSIS — I10 Essential (primary) hypertension: Secondary | ICD-10-CM | POA: Insufficient documentation

## 2020-07-11 DIAGNOSIS — Z01818 Encounter for other preprocedural examination: Secondary | ICD-10-CM | POA: Insufficient documentation

## 2020-07-11 LAB — BASIC METABOLIC PANEL
Anion gap: 9 (ref 5–15)
BUN: 15 mg/dL (ref 8–23)
CO2: 25 mmol/L (ref 22–32)
Calcium: 8.8 mg/dL — ABNORMAL LOW (ref 8.9–10.3)
Chloride: 105 mmol/L (ref 98–111)
Creatinine, Ser: 0.82 mg/dL (ref 0.61–1.24)
GFR, Estimated: >60 mL/min (ref >60)
Glucose, Bld: 113 mg/dL — ABNORMAL HIGH (ref 70–99)
Potassium: 3.6 mmol/L (ref 3.5–5.1)
Sodium: 139 mmol/L (ref 135–145)

## 2020-07-11 LAB — URINALYSIS, COMPLETE
Bilirubin, UA: NEGATIVE
Glucose, UA: NEGATIVE
Leukocytes,UA: NEGATIVE
Nitrite, UA: NEGATIVE
Protein,UA: NEGATIVE
RBC, UA: NEGATIVE
Specific Gravity, UA: 1.02 (ref 1.005–1.030)
Urobilinogen, Ur: 1 mg/dL (ref 0.2–1.0)
pH, UA: 6 (ref 5.0–7.5)

## 2020-07-11 LAB — MICROSCOPIC EXAMINATION: Bacteria, UA: NONE SEEN

## 2020-07-17 ENCOUNTER — Other Ambulatory Visit: Payer: Self-pay

## 2020-07-17 ENCOUNTER — Other Ambulatory Visit
Admission: RE | Admit: 2020-07-17 | Discharge: 2020-07-17 | Disposition: A | Discharge status: Home / Self Care | Payer: Medicare HMO | Source: Ambulatory Visit | Attending: Urology | Admitting: Urology

## 2020-07-17 DIAGNOSIS — Z20822 Contact with and (suspected) exposure to covid-19: Secondary | ICD-10-CM | POA: Diagnosis not present

## 2020-07-17 DIAGNOSIS — Z01812 Encounter for preprocedural laboratory examination: Secondary | ICD-10-CM | POA: Insufficient documentation

## 2020-07-17 LAB — CULTURE, URINE COMPREHENSIVE

## 2020-07-18 LAB — SARS CORONAVIRUS 2 (TAT 6-24 HRS): SARS Coronavirus 2: NEGATIVE

## 2020-07-19 ENCOUNTER — Encounter: Payer: Self-pay | Admitting: Urology

## 2020-07-19 ENCOUNTER — Ambulatory Visit: Payer: Medicare HMO

## 2020-07-19 ENCOUNTER — Ambulatory Visit
Admission: RE | Admit: 2020-07-19 | Discharge: 2020-07-19 | Disposition: A | Discharge status: Home / Self Care | Payer: Medicare HMO | Attending: Urology | Admitting: Urology

## 2020-07-19 ENCOUNTER — Ambulatory Visit: Payer: Medicare HMO | Admitting: Registered Nurse

## 2020-07-19 ENCOUNTER — Encounter
Admission: RE | Disposition: A | Discharge status: Home / Self Care | Payer: Self-pay | Source: Home / Self Care | Attending: Urology

## 2020-07-19 ENCOUNTER — Telehealth: Payer: Self-pay | Admitting: *Deleted

## 2020-07-19 ENCOUNTER — Other Ambulatory Visit: Payer: Self-pay

## 2020-07-19 DIAGNOSIS — C675 Malignant neoplasm of bladder neck: Secondary | ICD-10-CM | POA: Diagnosis not present

## 2020-07-19 DIAGNOSIS — C679 Malignant neoplasm of bladder, unspecified: Secondary | ICD-10-CM

## 2020-07-19 DIAGNOSIS — N401 Enlarged prostate with lower urinary tract symptoms: Secondary | ICD-10-CM | POA: Insufficient documentation

## 2020-07-19 DIAGNOSIS — F17210 Nicotine dependence, cigarettes, uncomplicated: Secondary | ICD-10-CM | POA: Diagnosis not present

## 2020-07-19 DIAGNOSIS — N138 Other obstructive and reflux uropathy: Secondary | ICD-10-CM | POA: Diagnosis not present

## 2020-07-19 DIAGNOSIS — Z79899 Other long term (current) drug therapy: Secondary | ICD-10-CM | POA: Insufficient documentation

## 2020-07-19 DIAGNOSIS — D494 Neoplasm of unspecified behavior of bladder: Secondary | ICD-10-CM

## 2020-07-19 HISTORY — PX: TRANSURETHRAL RESECTION OF BLADDER TUMOR WITH MITOMYCIN-C: SHX6459

## 2020-07-19 HISTORY — DX: Malignant neoplasm of bladder, unspecified: C67.9

## 2020-07-19 HISTORY — PX: CYSTOSCOPY W/ RETROGRADES: SHX1426

## 2020-07-19 SURGERY — TRANSURETHRAL RESECTION OF BLADDER TUMOR WITH MITOMYCIN-C
Anesthesia: General

## 2020-07-19 MED ORDER — LIDOCAINE HCL (PF) 2 % IJ SOLN
INTRAMUSCULAR | Status: AC
  Filled 2020-07-19: qty 5

## 2020-07-19 MED ORDER — CEFAZOLIN SODIUM-DEXTROSE 2-4 GM/100ML-% IV SOLN
2.0000 g | INTRAVENOUS | Status: AC
  Administered 2020-07-19: 2 g via INTRAVENOUS

## 2020-07-19 MED ORDER — PROPOFOL 500 MG/50ML IV EMUL
INTRAVENOUS | Status: AC
  Filled 2020-07-19: qty 50

## 2020-07-19 MED ORDER — DEXAMETHASONE SODIUM PHOSPHATE 10 MG/ML IJ SOLN
INTRAMUSCULAR | Status: AC
  Filled 2020-07-19: qty 1

## 2020-07-19 MED ORDER — PHENYLEPHRINE HCL (PRESSORS) 10 MG/ML IV SOLN
INTRAVENOUS | Status: DC | PRN
  Administered 2020-07-19 (×2): 100 ug via INTRAVENOUS

## 2020-07-19 MED ORDER — DEXAMETHASONE SODIUM PHOSPHATE 10 MG/ML IJ SOLN
INTRAMUSCULAR | Status: DC | PRN
  Administered 2020-07-19: 6 mg via INTRAVENOUS

## 2020-07-19 MED ORDER — ACETAMINOPHEN 325 MG PO TABS: 325.0000 mg | ORAL_TABLET | ORAL | Status: DC | PRN

## 2020-07-19 MED ORDER — PROMETHAZINE HCL 25 MG/ML IJ SOLN: 6.2500 mg | INTRAMUSCULAR | Status: DC | PRN

## 2020-07-19 MED ORDER — ORAL CARE MOUTH RINSE: 15.0000 mL | Freq: Once | OROMUCOSAL | Status: AC

## 2020-07-19 MED ORDER — IOHEXOL 180 MG/ML  SOLN
INTRAMUSCULAR | Status: DC | PRN
  Administered 2020-07-19: 20 mL

## 2020-07-19 MED ORDER — ONDANSETRON HCL 4 MG/2ML IJ SOLN
INTRAMUSCULAR | Status: AC
  Filled 2020-07-19: qty 2

## 2020-07-19 MED ORDER — SEVOFLURANE IN SOLN
RESPIRATORY_TRACT | Status: AC
  Filled 2020-07-19: qty 250

## 2020-07-19 MED ORDER — KETAMINE HCL 50 MG/5ML IJ SOSY
PREFILLED_SYRINGE | INTRAMUSCULAR | Status: AC
  Filled 2020-07-19: qty 5

## 2020-07-19 MED ORDER — ACETAMINOPHEN 160 MG/5ML PO SOLN
325.0000 mg | ORAL | Status: DC | PRN
  Filled 2020-07-19: qty 20.3

## 2020-07-19 MED ORDER — ROCURONIUM BROMIDE 10 MG/ML (PF) SYRINGE
PREFILLED_SYRINGE | INTRAVENOUS | Status: AC
  Filled 2020-07-19: qty 10

## 2020-07-19 MED ORDER — LIDOCAINE HCL (CARDIAC) PF 100 MG/5ML IV SOSY
PREFILLED_SYRINGE | INTRAVENOUS | Status: DC | PRN
  Administered 2020-07-19: 100 mg via INTRAVENOUS

## 2020-07-19 MED ORDER — HYDROCODONE-ACETAMINOPHEN 7.5-325 MG PO TABS
1.0000 | ORAL_TABLET | Freq: Once | ORAL | Status: DC | PRN
  Filled 2020-07-19: qty 1

## 2020-07-19 MED ORDER — CHLORHEXIDINE GLUCONATE 0.12 % MT SOLN
OROMUCOSAL | Status: AC
  Administered 2020-07-19: 15 mL via OROMUCOSAL
  Filled 2020-07-19: qty 15

## 2020-07-19 MED ORDER — PROPOFOL 10 MG/ML IV BOLUS
INTRAVENOUS | Status: DC | PRN
  Administered 2020-07-19: 150 mg via INTRAVENOUS

## 2020-07-19 MED ORDER — LACTATED RINGERS IV SOLN: INTRAVENOUS | Status: DC

## 2020-07-19 MED ORDER — ONDANSETRON HCL 4 MG/2ML IJ SOLN
INTRAMUSCULAR | Status: DC | PRN
  Administered 2020-07-19: 4 mg via INTRAVENOUS

## 2020-07-19 MED ORDER — FENTANYL CITRATE (PF) 100 MCG/2ML IJ SOLN: 25.0000 ug | INTRAMUSCULAR | Status: DC | PRN

## 2020-07-19 MED ORDER — HYDROCODONE-ACETAMINOPHEN 5-325 MG PO TABS: 1.0000 | ORAL_TABLET | Freq: Four times a day (QID) | ORAL | 0 refills | Status: DC | PRN

## 2020-07-19 MED ORDER — CEFAZOLIN SODIUM-DEXTROSE 2-4 GM/100ML-% IV SOLN
INTRAVENOUS | Status: AC
  Filled 2020-07-19: qty 100

## 2020-07-19 MED ORDER — FENTANYL CITRATE (PF) 100 MCG/2ML IJ SOLN
INTRAMUSCULAR | Status: DC | PRN
  Administered 2020-07-19 (×3): 25 ug via INTRAVENOUS

## 2020-07-19 MED ORDER — OXYBUTYNIN CHLORIDE 5 MG PO TABS: 5.0000 mg | ORAL_TABLET | Freq: Three times a day (TID) | ORAL | 0 refills | Status: DC | PRN

## 2020-07-19 MED ORDER — CHLORHEXIDINE GLUCONATE 0.12 % MT SOLN: 15.0000 mL | Freq: Once | OROMUCOSAL | Status: AC

## 2020-07-19 MED ORDER — KETAMINE HCL 10 MG/ML IJ SOLN
INTRAMUSCULAR | Status: DC | PRN
  Administered 2020-07-19: 20 mg via INTRAVENOUS

## 2020-07-19 MED ORDER — FENTANYL CITRATE (PF) 100 MCG/2ML IJ SOLN
INTRAMUSCULAR | Status: AC
  Filled 2020-07-19: qty 2

## 2020-07-19 SURGICAL SUPPLY — 38 items
BAG DRAIN CYSTO-URO LG1000N (MISCELLANEOUS) ×3 IMPLANT
BAG DRN RND TRDRP ANRFLXCHMBR (UROLOGICAL SUPPLIES)
BAG URINE DRAIN 2000ML AR STRL (UROLOGICAL SUPPLIES) IMPLANT
BRUSH SCRUB EZ  4% CHG (MISCELLANEOUS) ×1
BRUSH SCRUB EZ 1% IODOPHOR (MISCELLANEOUS) IMPLANT
BRUSH SCRUB EZ 4% CHG (MISCELLANEOUS) ×2 IMPLANT
CATH FOLEY 2WAY  5CC 16FR (CATHETERS)
CATH FOLEY 2WAY 5CC 16FR (CATHETERS)
CATH URETL 5X70 OPEN END (CATHETERS) ×3 IMPLANT
CATH URTH 16FR FL 2W BLN LF (CATHETERS) IMPLANT
DRAPE UTILITY 15X26 TOWEL STRL (DRAPES) ×3 IMPLANT
DRSG TELFA 4X3 1S NADH ST (GAUZE/BANDAGES/DRESSINGS) ×3 IMPLANT
ELECT LOOP 22F BIPOLAR SML (ELECTROSURGICAL)
ELECT REM PT RETURN 9FT ADLT (ELECTROSURGICAL)
ELECTRODE LOOP 22F BIPOLAR SML (ELECTROSURGICAL) IMPLANT
ELECTRODE REM PT RTRN 9FT ADLT (ELECTROSURGICAL) IMPLANT
GLOVE BIO SURGEON STRL SZ 6.5 (GLOVE) ×6 IMPLANT
GOWN STRL REUS W/ TWL LRG LVL3 (GOWN DISPOSABLE) ×4 IMPLANT
GOWN STRL REUS W/TWL LRG LVL3 (GOWN DISPOSABLE) ×6
GUIDEWIRE STR DUAL SENSOR (WIRE) ×3 IMPLANT
IV NS IRRIG 3000ML ARTHROMATIC (IV SOLUTION) ×6 IMPLANT
KIT TURNOVER CYSTO (KITS) ×3 IMPLANT
LOOP CUT BIPOLAR 24F LRG (ELECTROSURGICAL) IMPLANT
MANIFOLD NEPTUNE II (INSTRUMENTS) IMPLANT
NDL SAFETY ECLIPSE 18X1.5 (NEEDLE) ×2 IMPLANT
NEEDLE HYPO 18GX1.5 SHARP (NEEDLE) ×3
PACK CYSTO AR (MISCELLANEOUS) ×3 IMPLANT
PAD ARMBOARD 7.5X6 YLW CONV (MISCELLANEOUS) ×3 IMPLANT
SET CYSTO W/LG BORE CLAMP LF (SET/KITS/TRAYS/PACK) ×3 IMPLANT
SET IRRIG Y TYPE TUR BLADDER L (SET/KITS/TRAYS/PACK) ×3 IMPLANT
SOL .9 NS 3000ML IRR  AL (IV SOLUTION)
SOL .9 NS 3000ML IRR AL (IV SOLUTION)
SOL .9 NS 3000ML IRR UROMATIC (IV SOLUTION) IMPLANT
SURGILUBE 2OZ TUBE FLIPTOP (MISCELLANEOUS) ×3 IMPLANT
SYR TOOMEY IRRIG 70ML (MISCELLANEOUS)
SYRINGE TOOMEY IRRIG 70ML (MISCELLANEOUS) IMPLANT
WATER STERILE IRR 1000ML POUR (IV SOLUTION) ×3 IMPLANT
WATER STERILE IRR 3000ML UROMA (IV SOLUTION) IMPLANT

## 2020-07-19 NOTE — Anesthesia Procedure Notes (Signed)
Procedure Name: LMA Insertion Date/Time: 07/19/2020 8:02 AM Performed by: Lynden Oxford, CRNA Pre-anesthesia Checklist: Patient identified, Emergency Drugs available, Suction available and Patient being monitored Patient Re-evaluated:Patient Re-evaluated prior to induction Oxygen Delivery Method: Circle system utilized Preoxygenation: Pre-oxygenation with 100% oxygen Induction Type: IV induction Ventilation: Mask ventilation without difficulty LMA: LMA flexible inserted LMA Size: 4.5 Tube type: Oral Number of attempts: 1 Airway Equipment and Method: Oral airway and Video-laryngoscopy Placement Confirmation: ETT inserted through vocal cords under direct vision,  positive ETCO2 and breath sounds checked- equal and bilateral Tube secured with: Tape Dental Injury: Teeth and Oropharynx as per pre-operative assessment

## 2020-07-19 NOTE — Anesthesia Postprocedure Evaluation (Signed)
Anesthesia Post Note  Patient: Christian Pena  Procedure(s) Performed: TRANSURETHRAL RESECTION OF BLADDER TUMOR WITH Gemcitabine (N/A ) CYSTOSCOPY WITH RETROGRADE PYELOGRAM (Bilateral )  Patient location during evaluation: PACU Anesthesia Type: General Level of consciousness: awake and alert Pain management: pain level controlled Vital Signs Assessment: post-procedure vital signs reviewed and stable Respiratory status: spontaneous breathing, nonlabored ventilation and respiratory function stable Cardiovascular status: blood pressure returned to baseline and stable Postop Assessment: no apparent nausea or vomiting Anesthetic complications: no   No complications documented.   Last Vitals:  Vitals:   07/19/20 1007 07/19/20 1027  BP: (!) 168/93 (!) 148/91  Pulse: 65 66  Resp: 12 12  Temp: (!) 36.4 C   SpO2: 98% 97%    Last Pain:  Vitals:   07/19/20 1027  TempSrc:   PainSc: 0-No pain                 [name]

## 2020-07-19 NOTE — Discharge Instructions (Signed)
Transurethral Resection of Bladder Tumor (TURBT) or Bladder Biopsy ° ° °Definition: ° Transurethral Resection of the Bladder Tumor is a surgical procedure used to diagnose and remove tumors within the bladder. TURBT is the most common treatment for early stage bladder cancer. ° °General instructions: °   ° Your recent bladder surgery requires very little post hospital care but some definite precautions. ° °Despite the fact that no skin incisions were used, the area around the bladder incisions are raw and covered with scabs to promote healing and prevent bleeding. Certain precautions are needed to insure that the scabs are not disturbed over the next 2-4 weeks while the healing proceeds. ° °Because the raw surface inside your bladder and the irritating effects of urine you may expect frequency of urination and/or urgency (a stronger desire to urinate) and perhaps even getting up at night more often. This will usually resolve or improve slowly over the healing period. You may see some blood in your urine over the first 6 weeks. Do not be alarmed, even if the urine was clear for a while. Get off your feet and drink lots of fluids until clearing occurs. If you start to pass clots or don't improve call us. ° °Diet: ° °You may return to your normal diet immediately. Because of the raw surface of your bladder, alcohol, spicy foods, foods high in acid and drinks with caffeine may cause irritation or frequency and should be used in moderation. To keep your urine flowing freely and avoid constipation, drink plenty of fluids during the day (8-10 glasses). Tip: Avoid cranberry juice because it is very acidic. ° °Activity: ° °Your physical activity doesn't need to be restricted. However, if you are very active, you may see some blood in the urine. We suggest that you reduce your activity under the circumstances until the bleeding has stopped. ° °Bowels: ° °It is important to keep your bowels regular during the postoperative  period. Straining with bowel movements can cause bleeding. A bowel movement every other day is reasonable. Use a mild laxative if needed, such as milk of magnesia 2-3 tablespoons, or 2 Dulcolax tablets. Call if you continue to have problems. If you had been taking narcotics for pain, before, during or after your surgery, you may be constipated. Take a laxative if necessary. ° ° ° °Medication: ° °You should resume your pre-surgery medications unless told not to. In addition you may be given an antibiotic to prevent or treat infection. Antibiotics are not always necessary. All medication should be taken as prescribed until the bottles are finished unless you are having an unusual reaction to one of the drugs. ° ° °[facility] °[place], [place] [id number] °[phone] ° ° ° °AMBULATORY SURGERY  °DISCHARGE INSTRUCTIONS ° ° °1) The drugs that you were given will stay in your system until tomorrow so for the next 24 hours you should not: ° °A) Drive an automobile °B) Make any legal decisions °C) Drink any alcoholic beverage ° ° °2) You may resume regular meals tomorrow.  Today it is better to start with liquids and gradually work up to solid foods. ° °You may eat anything you prefer, but it is better to start with liquids, then soup and crackers, and gradually work up to solid foods. ° ° °3) Please notify your doctor immediately if you have any unusual bleeding, trouble breathing, redness and pain at the surgery site, drainage, fever, or pain not relieved by medication. ° ° ° °4) Additional Instructions: ° ° ° ° ° ° ° °  Please contact your physician with any problems or Same Day Surgery at [phone], Monday through Friday 6 am to 4 pm, or [facility] Main number at [phone]. ° °

## 2020-07-19 NOTE — Op Note (Signed)
Date of procedure: 07/19/20  Preoperative diagnosis:  1.  Bladder neck tumor  Postoperative diagnosis:  1. same   Procedure: 1. TURBT, small 2. Bilateral retrograde pyelogram 3. Instillation of intravesical chemotherapy  Surgeon: Vanna Scotland, MD  Anesthesia: General  Complications: None  Intraoperative findings: 1 cm spherical bladder tumor at midline bladder neck ball valving effect.  Trabeculated bladder, moderate.  Bilateral retrogrades unremarkable.  EBL: Minimal  Specimens: Bladder neck tumor, deep base  Drains: 16 French Foley catheter  Indication: Christian Pena is a 70 y.o. patient with obstructive urinary symptoms found to have an incidental bladder neck tumor mass bowel evolving which may be the source of obstruction.  After reviewing the management options for treatment, he elected to proceed with the above surgical procedure(s). We have discussed the potential benefits and risks of the procedure, side effects of the proposed treatment, the likelihood of the patient achieving the goals of the procedure, and any potential problems that might occur during the procedure or recuperation. Informed consent has been obtained.  Description of procedure:  The patient was taken to the operating room and general anesthesia was induced.  The patient was placed in the dorsal lithotomy position, prepped and draped in the usual sterile fashion, and preoperative antibiotics were administered. A preoperative time-out was performed.   A 26 French resectoscope was advanced per urethra into the bladder using a blunt obturator.  The visual obturator was brought in and using the laser bridge, bilateral retrograde pyelograms were performed.  These were unremarkable without hydroureteronephrosis or reflux bilaterally.  The bladder is carefully inspected.  Was noted to be moderately trabeculated throughout.  There was a tumor, spherical on a relatively small stalk at the bladder neck just at  the opening we had a ball-valve effect on the bladder neck.  A bipolar resectoscope was then brought in using saline as the medium, the tumor was resected at the base en bloc.  A deeper swipe was taken of the base and sent as a separate specimen.  The bladder chips were then evacuated from the bladder.  Careful and adequate hemostasis was achieved.  No additional tumors were identified.  The scope was then removed and a 16 French Foley catheter was placed easily within the bladder.  The bladder was drained and the balloon was filled with 10 cc of sterile water.  2000 mg of intravesical gemcitabine was instilled into the Foley which was clamped off.  This was allowed to dwell in the PACU for an hour.  After an hour, was drained, the catheter was removed which was well-tolerated and the patient was ultimately discharged home.  Plan: We will tentatively plan to have him come back in 3 months for cystoscopy as I suspect this likely will be low-grade superficial tumor.  I will call him with his pathology results.  If he needs any additional treatment, we will have him come to the office next week to discuss further.  All questions answered.  Vanna Scotland, M.D.

## 2020-07-19 NOTE — Telephone Encounter (Addendum)
Patient aware, voiced understanding   ----- Message from Vanna Scotland, MD sent at 07/17/2020 12:33 PM EST ----- Other than known enlarged prostate and bladder mass, there is no other issues including normal-looking kidneys.  You do have a mildly enlarged spleen.  Vanna Scotland, MD

## 2020-07-19 NOTE — Anesthesia Preprocedure Evaluation (Addendum)
Anesthesia Evaluation  Patient identified by MRN, date of birth, ID band Patient awake    Reviewed: Allergy & Precautions, H&P , NPO status , reviewed documented beta blocker date and time   Airway Mallampati: II  TM Distance: >3 FB Neck ROM: full    Dental no notable dental hx. (+) Caps, Teeth Intact   Pulmonary Current Smoker and Patient abstained from smoking.,           Cardiovascular hypertension, Normal cardiovascular exam     Neuro/Psych    GI/Hepatic GERD  Medicated and Controlled,  Endo/Other    Renal/GU      Musculoskeletal   Abdominal   Peds  Hematology   Anesthesia Other Findings Past Medical History: No date: GERD (gastroesophageal reflux disease) No date: Hypertension Past Surgical History: No date: TONSILLECTOMY   Reproductive/Obstetrics                            Anesthesia Physical Anesthesia Plan  ASA: II  Anesthesia Plan: General   Post-op Pain Management:    Induction: Intravenous  PONV Risk Score and Plan: Ondansetron, Treatment may vary due to age or medical condition and Midazolam  Airway Management Planned: LMA  Additional Equipment:   Intra-op Plan:   Post-operative Plan: Extubation in OR  Informed Consent: I have reviewed the patients History and Physical, chart, labs and discussed the procedure including the risks, benefits and alternatives for the proposed anesthesia with the patient or authorized representative who has indicated his/her understanding and acceptance.     Dental Advisory Given  Plan Discussed with: CRNA  Anesthesia Plan Comments:         Anesthesia Quick Evaluation

## 2020-07-19 NOTE — Transfer of Care (Signed)
Immediate Anesthesia Transfer of Care Note  Patient: Christian Pena  Procedure(s) Performed: TRANSURETHRAL RESECTION OF BLADDER TUMOR WITH Gemcitabine (N/A ) CYSTOSCOPY WITH RETROGRADE PYELOGRAM (Bilateral )  Patient Location: PACU  Anesthesia Type:General  Level of Consciousness: drowsy  Airway & Oxygen Therapy: Patient Spontanous Breathing  Post-op Assessment: Report given to RN and Post -op Vital signs reviewed and stable  Post vital signs: Reviewed and stable  Last Vitals:  Vitals Value Taken Time  BP 112/78 07/19/20 0848  Temp 36.1 C 07/19/20 0848  Pulse 69 07/19/20 0851  Resp 18 07/19/20 0851  SpO2 95 % 07/19/20 0851  Vitals shown include unvalidated device data.  Last Pain:  Vitals:   07/19/20 0729  TempSrc: Oral  PainSc: 0-No pain         Complications: No complications documented.

## 2020-07-19 NOTE — H&P (Signed)
HPI: 70 year old male with refractory urinary symptoms related to BPH who presents today to the office for cystoscopy and prostate sizing.  He is currently on Flomax with improvement in his urinary symptoms although he occasionally has orthostatic hypotension and dizziness of Flomax and prefer to be off of this medication.   Cystoscopy Procedure Note  Patient identification was confirmed, informed consent was obtained, and patient was prepped using Betadine solution.  Lidocaine jelly was administered per urethral meatus.    Preoperative abx where received prior to procedure.     Pre-Procedure: - Inspection reveals a normal caliber ureteral meatus.  Procedure: The flexible cystoscope was introduced without difficulty - No urethral strictures/lesions are present. - Enlarged prostate with speckled calcification along the length of mucosa -Irregular bladder neck which is elevated at the 7 o'clock position with a pedunculated mass initially felt to be a median lobe but then on retroflexion, appears to be 1 cm pedunculated bladder tumor either arising from the bladder neck itself or mucosal overlying the prostate with ball-valve effect into the bladder - Bilateral ureteral orifices identified - Bladder mucosa normal other than bladder tumor as above - No bladder stones - No trabeculation   Post-Procedure: - Patient tolerated the procedure well    Prostatetransrectalultrasoundsizing  Informed consent was obtained after discussing risks/benefits of the procedure. A time out was performed to ensure correct patient identity.  Pre-Procedure: -Transrectal probe was placed without difficulty -Transrectal Ultrasound performed revealing a54.98gm prostatemeasuring 5.61 x 3.93 x 4.76 cm(length) -No significant hypoechoic or median lobe noted, intravesical bladder neck mass appreciated on TRUS    Past Medical History:  Diagnosis Date  . GERD (gastroesophageal  reflux disease)   . Hypertension     Past Surgical History:  Procedure Laterality Date  . TONSILLECTOMY      Home Medications:  Current Meds  Medication Sig  . lisinopril (ZESTRIL) 10 MG tablet Take 10 mg by mouth daily.  . Multiple Vitamins-Minerals (MULTIVITAMIN WITH MINERALS) tablet Take 1 tablet by mouth daily.  Marland Kitchen omeprazole (PRILOSEC OTC) 20 MG tablet Take 20 mg by mouth daily.  . rosuvastatin (CRESTOR) 5 MG tablet Take 5 mg by mouth 3 (three) times a week.  . sildenafil (REVATIO) 20 MG tablet Take 20 mg by mouth daily as needed (ED).  . tamsulosin (FLOMAX) 0.4 MG CAPS capsule Take 1 capsule (0.4 mg total) by mouth daily.    Allergies: No Known Allergies  History reviewed. No pertinent family history.  Social History:  reports that he has been smoking cigarettes. He has never used smokeless tobacco. He reports current alcohol use. He reports that he does not use drugs.  ROS: A complete review of systems was performed.  All systems are negative except for pertinent findings as noted.  Physical Exam:  Vital signs in last 24 hours: Temp:  [97.9 F (36.6 C)] 97.9 F (36.6 C) (01/06 0729) Pulse Rate:  [75] 75 (01/06 0729) Resp:  [16] 16 (01/06 0729) BP: (148)/(84) 148/84 (01/06 0729) SpO2:  [99 %] 99 % (01/06 0729) Weight:  [98 kg] 98 kg (01/06 0729) Constitutional:  Alert and oriented, No acute distress HEENT: [place] AT, moist mucus membranes.  Trachea midline, no masses Cardiovascular: Regular rate and rhythm, no clubbing, cyanosis, or edema. Respiratory: Normal respiratory effort, lungs clear bilaterally Neurologic: Grossly intact, no focal deficits, moving all 4 extremities Psychiatric: Normal mood and affect   Impression/ Plan:   1. BPH with urinary obstruction Symptoms fairly reasonably well controlled on  Flomax although has occasional breakthrough symptoms based on discussion today  He still interested in UroLift but ball-valve effect from bladder mass may be  source of his urinary symptoms.  Will hold off on outlet procedure until we have adjusted below. 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  Recommend TURBT with bilateral retrograde given lack of upper tract imaging and instillation of intravesical gemcitabine.  Risk of bleeding infection damage surrounding structures amongst others were discussed.  We will also plan for renal ultrasound for upper tract imaging.    Preoperative UA/urine culture.  All questions answered. - US RENAL; Future   07/19/2020, 7:46 AM  Hollice Espy,  MD

## 2020-07-20 LAB — SURGICAL PATHOLOGY

## 2020-07-26 ENCOUNTER — Encounter: Payer: Self-pay | Admitting: Urology

## 2020-07-27 NOTE — Telephone Encounter (Signed)
-----   Message from Hollice Espy, MD sent at 07/27/2020 11:09 AM EST ----- Surgical pathology reviewed.  I would like to discuss these with the patient sooner than his 58-month cystoscopy to discuss observation versus treatment with BCG.  Please arrange for in person or virtual visit.  Hollice Espy, MD

## 2020-07-27 NOTE — Telephone Encounter (Signed)
Called patient to discuss further and answer questions. Patient would like clarification on his biopsy results. Offered to schedule appointment, declined. Voiced understanding on follow up cysto.

## 2020-07-27 NOTE — Telephone Encounter (Signed)
Scheduled follow up next week to discuss further. Patient voiced understanding.

## 2020-07-31 ENCOUNTER — Encounter: Payer: Self-pay | Admitting: Urology

## 2020-07-31 ENCOUNTER — Ambulatory Visit: Payer: Medicare HMO | Admitting: Urology

## 2020-07-31 ENCOUNTER — Other Ambulatory Visit: Payer: Self-pay

## 2020-07-31 VITALS — BP 149/79 | HR 86

## 2020-07-31 DIAGNOSIS — N138 Other obstructive and reflux uropathy: Secondary | ICD-10-CM

## 2020-07-31 DIAGNOSIS — C675 Malignant neoplasm of bladder neck: Secondary | ICD-10-CM

## 2020-07-31 DIAGNOSIS — N401 Enlarged prostate with lower urinary tract symptoms: Secondary | ICD-10-CM

## 2020-07-31 NOTE — Patient Instructions (Signed)

## 2020-07-31 NOTE — Progress Notes (Signed)
07/31/2020 12:58 PM   ELAINE MIDDLETON [birth year] [id number]  Referring provider: Sallee Lange, NP [address] [place],  [place] [address]  Chief Complaint  Patient presents with  . Results    HPI: 70 year old male with new diagnosis of bladder cancer presents today to discuss options.  He initially presented with 6 symptoms of BPH and urinary obstruction.  As part of this investigation, he underwent cystoscopy which abated with a spherical pedunculated tumor on his bladder neck which was ball valving  He underwent TURBT on 07/19/2020 which revealed high-grade noninvasive urothelial carcinoma.  Muscularis propria was present and not involved.  No other bladder lesions were identified.  No personal history of gross or microscopic hematuria prior to this incidental finding.  He does report some mild improvement in his obstructive urinary symptoms including improved stream.  He is still taking Flomax would like to try to get off this medication.  He wonders how much of his urinary symptoms are related to this tumor and how much are prostate related.  He continues to have some bladder irritation related to surgery.    PMH: Past Medical History:  Diagnosis Date  . GERD (gastroesophageal reflux disease)   . Hypertension     Surgical History: Past Surgical History:  Procedure Laterality Date  . CYSTOSCOPY W/ RETROGRADES Bilateral 07/19/2020   Procedure: CYSTOSCOPY WITH RETROGRADE PYELOGRAM;  Surgeon: Hollice Espy, MD;  Location: [facility] ORS;  Service: Urology;  Laterality: Bilateral;  . TONSILLECTOMY    . TRANSURETHRAL RESECTION OF BLADDER TUMOR WITH MITOMYCIN-C N/A 07/19/2020   Procedure: TRANSURETHRAL RESECTION OF BLADDER TUMOR WITH Gemcitabine;  Surgeon: Hollice Espy, MD;  Location: [facility] ORS;  Service: Urology;  Laterality: N/A;    Home Medications:  Allergies as of 07/31/2020   No Known Allergies     Medication List       Accurate as of July 31, 2020 12:58  PM. If you have any questions, ask your nurse or doctor.        STOP taking these medications   HYDROcodone-acetaminophen 5-325 MG tablet Commonly known as: NORCO/VICODIN Stopped by: Hollice Espy, MD   oxybutynin 5 MG tablet Commonly known as: DITROPAN Stopped by: Hollice Espy, MD     TAKE these medications   aspirin 81 MG EC tablet Take 81 mg by mouth daily.   lisinopril 10 MG tablet Commonly known as: ZESTRIL Take 10 mg by mouth daily.   multivitamin with minerals tablet Take 1 tablet by mouth daily.   omeprazole 20 MG capsule Commonly known as: PRILOSEC Take by mouth.   omeprazole 20 MG tablet Commonly known as: PRILOSEC OTC Take 20 mg by mouth daily.   rosuvastatin 5 MG tablet Commonly known as: CRESTOR Take 5 mg by mouth 3 (three) times a week.   sildenafil 20 MG tablet Commonly known as: REVATIO Take 20 mg by mouth daily as needed (ED).   sildenafil 25 MG tablet Commonly known as: VIAGRA Take by mouth.   tamsulosin 0.4 MG Caps capsule Commonly known as: Flomax Take 1 capsule (0.4 mg total) by mouth daily.       Allergies: No Known Allergies  Family History: No family history on file.  Social History:  reports that he has been smoking cigarettes. He has never used smokeless tobacco. He reports current alcohol use. He reports that he does not use drugs.   Physical Exam: BP (!) 149/79   Pulse 86   Constitutional:  Alert and oriented, No acute distress.  Wife present on speaker phone today HEENT: [place] AT, moist mucus membranes.  Trachea midline, no masses. Cardiovascular: No clubbing, cyanosis, or edema. Respiratory: Normal respiratory effort, no increased work of breathing. Skin: No rashes, bruises or suspicious lesions. Neurologic: Grossly intact, no focal deficits, moving all 4 extremities. Psychiatric: Normal mood and affect.  US RENAL  Narrative CLINICAL DATA:  Urinary bladder mass  EXAM: RENAL / URINARY TRACT ULTRASOUND  COMPLETE  COMPARISON:  None.  FINDINGS: Right Kidney:  Renal measurements: 11.1 x 5.3 x 5.8 cm = volume: 178.2 mL. Echogenicity and renal cortical thickness are within normal limits. No mass, perinephric fluid, or hydronephrosis visualized. No sonographically demonstrable calculus or ureterectasis.  Left Kidney:  Renal measurements: 12.3 x 5.4 x 5.0 cm = volume: 192.4 mL. Echogenicity and renal cortical thickness are within normal limits. No mass, perinephric fluid, or hydronephrosis visualized. No sonographically demonstrable calculus or ureterectasis.  Bladder:  There is an apparent mass arising along the posterior aspect of the bladder measuring 1.1 x 1.0 x 1.2 cm. No appreciable urinary bladder wall thickening.  Other:  Prostate measures 4.7 x 4.2 x 5.6 cm. Echotexture mildly inhomogeneous.  Spleen measures 14.5 x 6.4 x 13.2 cm with a measured splenic volume of 612 mL. No splenic lesions evident.  IMPRESSION: 1. Apparent mass arising from the posterior aspect of the bladder measuring 1.1 x 1.0 x 1.2 cm. Urinary bladder otherwise appears unremarkable.  2.  Prostate prominent and mildly inhomogeneous in echotexture.  3.  Normal appearing kidneys bilaterally.  4.  Enlarged spleen.   Electronically Signed By: Lowella Grip III M.D. On: 07/03/2020 09:02   Assessment & Plan:    1. Malignant neoplasm of urinary bladder neck [facility]) Surgical pathology reviewed, consistent with high-grade TA TCC  We discussed surveillance protocol based on restratification guidelines  In addition to this, we did discuss the option of BCG at length today.  Risk and benefits were reviewed in detail.  Specifically, risk of UTI, bleeding, urinary issues, BCG sepsis along with no constitutional side effects were discussed.  Ultimately, he elected to pursue an induction course.  We will likely forego maintenance given that this is his first tumor recurrence and the tumor is relatively  small.  He is agreeable this plan.  Will plan for surveillance cystoscopy 3 months after the completion of induction BCG.  2. BPH with urinary obstruction Obstructive urinary symptoms improved somewhat following resection of this tumor obstructing the bladder neck  Continues to have underlying BPH which may also be contributing to his symptoms.  He like to try to wean off Flomax.  I like him to continue this medication for at least another week postop and then see how his symptoms are after he discontinues.  We will reassess at next visit.  He originally was being evaluated for UroLift candidacy.  May consider this in the future if his symptoms fail to improve or worsen.  BCG x6 starting in about 4 weeks then cystoscopy in 3 months thereafter  Hollice Espy, MD  [facility] [address], [place], [place] [address] [phone]  I spent 30 total minutes on the day of the encounter including pre-visit review of the medical record, face-to-face time with the patient, and post visit ordering of labs/imaging/tests.

## 2020-08-10 ENCOUNTER — Telehealth: Payer: Self-pay

## 2020-08-10 NOTE — Telephone Encounter (Signed)
Patient notified of detailed BCG instructions and was scheduled for treatments. Printed instructions were given to patient at his recent follow up. Patient verbalized understanding.

## 2020-09-04 ENCOUNTER — Ambulatory Visit (INDEPENDENT_AMBULATORY_CARE_PROVIDER_SITE_OTHER): Payer: Medicare HMO | Admitting: Physician Assistant

## 2020-09-04 ENCOUNTER — Other Ambulatory Visit: Payer: Self-pay

## 2020-09-04 DIAGNOSIS — C675 Malignant neoplasm of bladder neck: Secondary | ICD-10-CM | POA: Diagnosis not present

## 2020-09-04 LAB — MICROSCOPIC EXAMINATION
Bacteria, UA: NONE SEEN
Epithelial Cells (non renal): NONE SEEN /hpf (ref 0–10)

## 2020-09-04 LAB — URINALYSIS, COMPLETE
Bilirubin, UA: NEGATIVE
Glucose, UA: NEGATIVE
Ketones, UA: NEGATIVE
Leukocytes,UA: NEGATIVE
Nitrite, UA: NEGATIVE
Protein,UA: NEGATIVE
Specific Gravity, UA: 1.025 (ref 1.005–1.030)
Urobilinogen, Ur: 0.2 mg/dL (ref 0.2–1.0)
pH, UA: 5.5 (ref 5.0–7.5)

## 2020-09-04 MED ORDER — BCG LIVE 50 MG IS SUSR
3.2400 mL | Freq: Once | INTRAVESICAL | Status: AC
  Administered 2020-09-04: 81 mg via INTRAVESICAL

## 2020-09-04 NOTE — Patient Instructions (Signed)
Right now through 11:25am: Hold your urine and do your quarter turns every 15 minutes. 11:25am-5:25pm today: Every time you urinate, pour 1/2 cup of bleach into the toilet and let it sit for 15 minutes prior to flushing. 5:25pm onward: Resume your normal routine.  Patient Education: (BCG) Into the Bladder (Intravesical Chemotherapy)  BCG is a vaccine which is used to prevent tuberculosis (TB).  But it's also a helpful treatment for some early bladder cancers.  When BCG goes directly into the bladder the treatment is described as intravesical.  BCG is a type of immunotherapy.  Immunotherapy stimulates the body's immune system to destroy cancer cells.  How it's given BCG treatment is given to you in an outpatient setting.  It takes a few minutes to administer and you can go home as soon as it's finished.  It might be a good idea to ask someone to bring you, particularly the fist time.  Unlike chemotherapy into the bladder, BCG treatment is never given immediately after surgery to remove bladder tumors.  There needs to be a delay usually of at least two weeks after surgery, before you can have it.  You won't be given treatment with BCG if you are unwell or have an infection in your urine.  You're usually asked to limit the amount you drink before your treatment.  This will help to increase the concentration of BCG in your bladder.  Drinking too much before your treatment may make your bladder feel uncomfortably full.  If you normally take water tablets (diuretics) take them later in the day after your treatment.  Your nurse or doctor will give you more advise about preparing for your treatment.  You will have a small tube (catheter) placed into your bladder.  Your doctor will then put the liquid vaccine directly into your bladder through the catheter and remove the catheter.  You will need to hold your urine for two hours afterwards.  This can be difficult but it's to give the treatment time to work.   You can walk around during this time.  When the treatment is over you can go to the toilet.  After your treatment there are some precautions you'll need to take.  This is because BCG is a live vaccine and other people shouldn't be exposed to it.  For the next six hours, you'll need to avoid your urine splashing on the toilet seat and getting any urine on your hands.  It might be easer for men to sit down when they're using an ordinary toilet although using a stand up urinal should be alright.  The main this is to avoid splashing urine and spreading the vaccine.  You will also be asked to put 1/2 cup undiluted bleach into the toilet to destroy any live vaccine and leave it for 15 minutes until you flush.  Side Effects Because BCG goes directly into the bladder most of the side effects are linked with the bladder.  They usually go away within one to two days after your treatment.  The most common ones are: -needing to pass urine often -pain when you pass urine -blood in urine -flu-like symptoms (tiredness, general aching and raised temperature)  Theses side effects should settle down within a day or two.  If they don't get better contact your doctor.  Drinking lots of fluids can help flush the drug out of your bladder and reduce some of these effects.  Taking Ibuprofen or Aleve is encouraged unless you have a condition  that would make these medications unsafe to take (renal failure, diabetes, gerd)  Rare side effects can include a continuing high temperature (fever), pain in your joints and a cough.  If you have any of these symptoms, or if you feel generally unwell, contact your doctor.  These symptoms could be a sign of a more serious infection (due to BCG) that needs to be treated immediately.  If this happens you'll be treated with the same drugs (antibiotics) that are used to treat TB.  Contraception Men should use a condom during sex for the first 48 hours after their treatment.  If you are a  women who has had BCG treatment then your partner should use a condom.  Using a condom will protect your partner from any vaccine present in your semen or vaginal fluid.  We don't know how BCG may affect a developing fetus so it's not advisable to become pregnant or father a child while having it.  It is important to use effective contraception during your treatment and for six weeks afterwards.  You can discuss this with your doctor or specialist nurse.

## 2020-09-04 NOTE — Progress Notes (Signed)
BCG Bladder Instillation  BCG # 1 of 6  Due to Bladder Cancer patient is present today for a BCG treatment. Patient was cleaned and prepped in a sterile fashion with betadine. A 14FR coude catheter was inserted, urine return was noted 24ml, urine was yellow in color.  33ml of reconstituted BCG was instilled into the bladder. The catheter was then removed. Patient tolerated well, no complications were noted  Performed by: Debroah Loop, PA-C   Additional notes: Patient remained in clinic for 30 minutes following instillation today for monitoring of hypersensitivity reaction; none noted.  We reviewed post-instillation instructions including holding the urine for 2 hours with quarter turns every 15 minutes and pouring bleach into the toilet with subsequent voids for 6 hours. Written instructions also provided today. He expressed understanding.  Follow up: 1 week for BCG #2 of 6

## 2020-09-11 ENCOUNTER — Other Ambulatory Visit: Payer: Self-pay

## 2020-09-11 ENCOUNTER — Ambulatory Visit (INDEPENDENT_AMBULATORY_CARE_PROVIDER_SITE_OTHER): Payer: Medicare HMO | Admitting: Physician Assistant

## 2020-09-11 ENCOUNTER — Encounter: Payer: Self-pay | Admitting: Physician Assistant

## 2020-09-11 VITALS — BP 107/64 | HR 89 | Ht 73.0 in | Wt 217.0 lb

## 2020-09-11 DIAGNOSIS — D494 Neoplasm of unspecified behavior of bladder: Secondary | ICD-10-CM | POA: Diagnosis not present

## 2020-09-11 DIAGNOSIS — J4520 Mild intermittent asthma, uncomplicated: Secondary | ICD-10-CM | POA: Insufficient documentation

## 2020-09-11 DIAGNOSIS — J302 Other seasonal allergic rhinitis: Secondary | ICD-10-CM | POA: Insufficient documentation

## 2020-09-11 LAB — MICROSCOPIC EXAMINATION
Bacteria, UA: NONE SEEN
Cast Type: NONE SEEN
Casts: NONE SEEN /lpf
Crystal Type: NONE SEEN
Crystals: NONE SEEN
Epithelial Cells (non renal): NONE SEEN /hpf (ref 0–10)
Mucus, UA: NONE SEEN
Renal Epithel, UA: NONE SEEN /hpf
Trichomonas, UA: NONE SEEN
Yeast, UA: NONE SEEN

## 2020-09-11 LAB — URINALYSIS, COMPLETE
Bilirubin, UA: NEGATIVE
Glucose, UA: NEGATIVE
Leukocytes,UA: NEGATIVE
Nitrite, UA: NEGATIVE
Protein,UA: NEGATIVE
Specific Gravity, UA: 1.02 (ref 1.005–1.030)
Urobilinogen, Ur: 1 mg/dL (ref 0.2–1.0)
pH, UA: 5.5 (ref 5.0–7.5)

## 2020-09-11 MED ORDER — BCG LIVE 50 MG IS SUSR
3.2400 mL | Freq: Once | INTRAVESICAL | Status: AC
  Administered 2020-09-11: 81 mg via INTRAVESICAL

## 2020-09-11 NOTE — Progress Notes (Signed)
BCG Bladder Instillation  BCG # 2 of 6  Due to Bladder Cancer patient is present today for a BCG treatment. Patient was cleaned and prepped in a sterile fashion with betadine. A 14FR catheter was inserted, urine return was noted 52ml, urine was yellow in color.  74ml of reconstituted BCG was instilled into the bladder. The catheter was then removed. Patient tolerated well, no complications were noted  Performed by: Debroah Loop, PA-C and Gordy Clement, [place]  Follow up/ Additional notes: 1 week for BCG #3 of 6

## 2020-09-18 ENCOUNTER — Ambulatory Visit (INDEPENDENT_AMBULATORY_CARE_PROVIDER_SITE_OTHER): Payer: Medicare HMO | Admitting: Physician Assistant

## 2020-09-18 ENCOUNTER — Other Ambulatory Visit: Payer: Self-pay

## 2020-09-18 DIAGNOSIS — D494 Neoplasm of unspecified behavior of bladder: Secondary | ICD-10-CM | POA: Diagnosis not present

## 2020-09-18 MED ORDER — BCG LIVE 50 MG IS SUSR
3.2400 mL | Freq: Once | INTRAVESICAL | Status: AC
  Administered 2020-09-18: 81 mg via INTRAVESICAL

## 2020-09-18 NOTE — Progress Notes (Signed)
BCG Bladder Instillation ° °BCG # 3 of 6 ° °Due to Bladder Cancer patient is present today for a BCG treatment. Patient was cleaned and prepped in a sterile fashion with betadine. A 14FR catheter was inserted, urine return was noted 25ml, urine was yellow in color.  50ml of reconstituted BCG was instilled into the bladder. The catheter was then removed. Patient tolerated well, no complications were noted ° °Performed by: Korion Cuevas, PA-C and Crysta Albright, CMA ° °Follow up/ Additional notes: 1 week for BCG #4 of 6 °  °

## 2020-09-19 ENCOUNTER — Other Ambulatory Visit: Payer: Self-pay | Admitting: Physician Assistant

## 2020-09-19 DIAGNOSIS — C675 Malignant neoplasm of bladder neck: Secondary | ICD-10-CM

## 2020-09-19 LAB — URINALYSIS, COMPLETE
Bilirubin, UA: NEGATIVE
Glucose, UA: NEGATIVE
Leukocytes,UA: NEGATIVE
Nitrite, UA: NEGATIVE
Protein,UA: NEGATIVE
Specific Gravity, UA: 1.02 (ref 1.005–1.030)
Urobilinogen, Ur: 1 mg/dL (ref 0.2–1.0)
pH, UA: 7 (ref 5.0–7.5)

## 2020-09-19 LAB — MICROSCOPIC EXAMINATION: Bacteria, UA: NONE SEEN

## 2020-09-19 MED ORDER — OXYBUTYNIN CHLORIDE 5 MG PO TABS: 5.0000 mg | ORAL_TABLET | Freq: Three times a day (TID) | ORAL | 1 refills | Status: DC | PRN

## 2020-09-25 ENCOUNTER — Other Ambulatory Visit: Payer: Self-pay

## 2020-09-25 ENCOUNTER — Ambulatory Visit (INDEPENDENT_AMBULATORY_CARE_PROVIDER_SITE_OTHER): Payer: Medicare HMO

## 2020-09-25 DIAGNOSIS — D494 Neoplasm of unspecified behavior of bladder: Secondary | ICD-10-CM

## 2020-09-25 LAB — URINALYSIS, COMPLETE
Bilirubin, UA: NEGATIVE
Glucose, UA: NEGATIVE
Ketones, UA: NEGATIVE
Leukocytes,UA: NEGATIVE
Nitrite, UA: NEGATIVE
Protein,UA: NEGATIVE
RBC, UA: NEGATIVE
Specific Gravity, UA: 1.015 (ref 1.005–1.030)
Urobilinogen, Ur: 0.2 mg/dL (ref 0.2–1.0)
pH, UA: 5.5 (ref 5.0–7.5)

## 2020-09-25 LAB — MICROSCOPIC EXAMINATION
Bacteria, UA: NONE SEEN
Epithelial Cells (non renal): NONE SEEN /hpf (ref 0–10)

## 2020-09-25 MED ORDER — BCG LIVE 50 MG IS SUSR
3.2400 mL | Freq: Once | INTRAVESICAL | Status: AC
  Administered 2020-09-25: 81 mg via INTRAVESICAL

## 2020-09-25 NOTE — Progress Notes (Signed)
BCG Bladder Instillation  BCG # 4 of 6  Due to Bladder Cancer patient is present today for a BCG treatment. Patient was cleaned and prepped in a sterile fashion with betadine. A 14FR catheter was inserted, urine return was noted 27ml, urine was dark yellow in color. 66ml of reconstituted BCG was instilled into the bladder. The catheter was then removed. Patient tolerated well, no complications were noted.  Preformed by: Gordy Clement, [place], CMA  Follow up/ Additional notes: RTC in 1 week for BCG

## 2020-10-02 ENCOUNTER — Other Ambulatory Visit: Payer: Self-pay

## 2020-10-02 ENCOUNTER — Ambulatory Visit (INDEPENDENT_AMBULATORY_CARE_PROVIDER_SITE_OTHER): Payer: Medicare HMO | Admitting: Physician Assistant

## 2020-10-02 DIAGNOSIS — D494 Neoplasm of unspecified behavior of bladder: Secondary | ICD-10-CM | POA: Diagnosis not present

## 2020-10-02 LAB — URINALYSIS, COMPLETE
Bilirubin, UA: NEGATIVE
Glucose, UA: NEGATIVE
Leukocytes,UA: NEGATIVE
Nitrite, UA: NEGATIVE
Protein,UA: NEGATIVE
Specific Gravity, UA: 1.02 (ref 1.005–1.030)
Urobilinogen, Ur: 1 mg/dL (ref 0.2–1.0)
pH, UA: 5.5 (ref 5.0–7.5)

## 2020-10-02 LAB — MICROSCOPIC EXAMINATION
Bacteria, UA: NONE SEEN
Epithelial Cells (non renal): NONE SEEN /hpf (ref 0–10)

## 2020-10-02 MED ORDER — BCG LIVE 50 MG IS SUSR
3.2400 mL | Freq: Once | INTRAVESICAL | Status: AC
  Administered 2020-10-02: 81 mg via INTRAVESICAL

## 2020-10-02 NOTE — Progress Notes (Signed)
BCG Bladder Instillation ° °BCG # 5 of 6 ° °Due to Bladder Cancer patient is present today for a BCG treatment. Patient was cleaned and prepped in a sterile fashion with betadine. A 14FR catheter was inserted, urine return was noted 25ml, urine was yellow in color.  50ml of reconstituted BCG was instilled into the bladder. The catheter was then removed. Patient tolerated well, no complications were noted ° °Performed by: Esmirna Ravan, PA-C and Crysta Albright, CMA ° °Follow up/ Additional notes: 1 week for BCG #6 of 6 °  °

## 2020-10-09 ENCOUNTER — Other Ambulatory Visit: Payer: Self-pay

## 2020-10-09 ENCOUNTER — Ambulatory Visit (INDEPENDENT_AMBULATORY_CARE_PROVIDER_SITE_OTHER): Payer: Medicare HMO | Admitting: Physician Assistant

## 2020-10-09 DIAGNOSIS — D494 Neoplasm of unspecified behavior of bladder: Secondary | ICD-10-CM | POA: Diagnosis not present

## 2020-10-09 LAB — URINALYSIS, COMPLETE
Bilirubin, UA: NEGATIVE
Glucose, UA: NEGATIVE
Ketones, UA: NEGATIVE
Leukocytes,UA: NEGATIVE
Nitrite, UA: NEGATIVE
Protein,UA: NEGATIVE
RBC, UA: NEGATIVE
Specific Gravity, UA: 1.015 (ref 1.005–1.030)
Urobilinogen, Ur: 0.2 mg/dL (ref 0.2–1.0)
pH, UA: 5.5 (ref 5.0–7.5)

## 2020-10-09 LAB — MICROSCOPIC EXAMINATION
Bacteria, UA: NONE SEEN
Epithelial Cells (non renal): NONE SEEN /hpf (ref 0–10)

## 2020-10-09 MED ORDER — BCG LIVE 50 MG IS SUSR
3.2400 mL | Freq: Once | INTRAVESICAL | Status: AC
  Administered 2020-10-09: 81 mg via INTRAVESICAL

## 2020-10-09 NOTE — Progress Notes (Signed)
BCG Bladder Instillation  BCG # 6 of 6  Due to Bladder Cancer patient is present today for a BCG treatment. Patient was cleaned and prepped in a sterile fashion with betadine. A 14FR catheter was inserted, urine return was noted 158ml, urine was yellow in color.  55ml of reconstituted BCG was instilled into the bladder. The catheter was then removed. Patient tolerated well, no complications were noted  Performed by: Debroah Loop, PA-C and Bradly Bienenstock, CMA  Follow up/ Additional notes: 3 months for cysto

## 2020-10-16 ENCOUNTER — Other Ambulatory Visit: Payer: Medicare HMO | Admitting: Urology

## 2020-12-04 ENCOUNTER — Other Ambulatory Visit: Payer: Medicare HMO | Admitting: Urology

## 2021-01-02 ENCOUNTER — Encounter: Payer: Self-pay | Admitting: Urology

## 2021-01-02 ENCOUNTER — Ambulatory Visit (INDEPENDENT_AMBULATORY_CARE_PROVIDER_SITE_OTHER): Payer: Medicare HMO | Admitting: Urology

## 2021-01-02 ENCOUNTER — Other Ambulatory Visit: Payer: Self-pay

## 2021-01-02 VITALS — BP 146/85 | HR 74 | Ht 72.0 in | Wt 215.0 lb

## 2021-01-02 DIAGNOSIS — Z8551 Personal history of malignant neoplasm of bladder: Secondary | ICD-10-CM

## 2021-01-02 DIAGNOSIS — N138 Other obstructive and reflux uropathy: Secondary | ICD-10-CM

## 2021-01-02 DIAGNOSIS — N401 Enlarged prostate with lower urinary tract symptoms: Secondary | ICD-10-CM

## 2021-01-02 MED ORDER — TAMSULOSIN HCL 0.4 MG PO CAPS: 0.4000 mg | ORAL_CAPSULE | Freq: Every day | ORAL | 11 refills | Status: DC

## 2021-01-02 NOTE — Progress Notes (Signed)
   01/02/21  CC:  Chief Complaint  Patient presents with   Cysto    HPI: 70 year old male with a personal history of bladder cancer presents today for surveillance cystoscopy  He underwent TURBT on 07/19/2020 which revealed high-grade noninvasive urothelial carcinoma (1 cm).  Muscularis propria was present and not involved.  No other bladder lesions were identified.  Upper tract imaging was performed in the form of renal ultrasound with bilateral retrogrades which were unremarkable at the time of surgery.  Underwent induction BCG x 6 completed in March/2022.  He mentions today after completing BCG, he he had some leftover Flomax and start taking this again.  He thinks he might need a prescription for this.    Today's Vitals   01/02/21 0857  BP: (!) 146/85  Pulse: 74  Weight: 215 lb (97.5 kg)  Height: 6' (1.829 m)   Body mass index is 29.16 kg/m.  NED. A&Ox3.   No respiratory distress   Abd soft, NT, ND Normal phallus with bilateral descended testicles  Cystoscopy Procedure Note  Patient identification was confirmed, informed consent was obtained, and patient was prepped using Betadine solution.  Lidocaine jelly was administered per urethral meatus.     Pre-Procedure: - Inspection reveals a normal caliber ureteral meatus.  Procedure: The flexible cystoscope was introduced without difficulty - No urethral strictures/lesions are present. - Enlarged prostate bilobar coaptation, greater than 5 cm prostatic length - Elevated bladder neck - Bilateral ureteral orifices identified - Bladder mucosa  reveals no ulcers, tumors, or lesions - No bladder stones - Mild trabeculation  Retroflexion shows intravesical prostate without any papillary lesions   Post-Procedure: - Patient tolerated the procedure well  Assessment/ Plan:   1. History of bladder cancer NED today  He falls into the intermediate risk category and thus surveillance should include cystoscopy +/-  cytology every 3-6 months for 2 years, then 6-12 months for years 3 and 4, and then annually thereafter  Post induction BCG, will hold off on maintenance BCG unless he has recurrence, he is agreeable this plan. - Urinalysis, Complete  2. BPH with urinary obstruction Aggravation of BPH symptoms following BCG, will resume Flomax and continue to monitor symptoms   Cysto in 3 months  Hollice Espy, MD

## 2021-01-02 NOTE — Patient Instructions (Signed)
A 

## 2021-01-03 LAB — URINALYSIS, COMPLETE
Bilirubin, UA: NEGATIVE
Glucose, UA: NEGATIVE
Ketones, UA: NEGATIVE
Leukocytes,UA: NEGATIVE
Nitrite, UA: NEGATIVE
Protein,UA: NEGATIVE
RBC, UA: NEGATIVE
Specific Gravity, UA: 1.015 (ref 1.005–1.030)
Urobilinogen, Ur: 0.2 mg/dL (ref 0.2–1.0)
pH, UA: 5.5 (ref 5.0–7.5)

## 2021-03-14 DIAGNOSIS — Z8616 Personal history of COVID-19: Secondary | ICD-10-CM

## 2021-03-14 HISTORY — DX: Personal history of COVID-19: Z86.16

## 2021-04-01 ENCOUNTER — Encounter: Payer: Self-pay | Admitting: Urology

## 2021-04-01 NOTE — Telephone Encounter (Signed)
Spoke with patient-rescheduled cysto appt

## 2021-04-03 ENCOUNTER — Other Ambulatory Visit: Payer: Self-pay | Admitting: Urology

## 2021-04-23 NOTE — Progress Notes (Addendum)
   04/24/2021 CC:  Chief Complaint  Patient presents with   Cysto    HPI: Christian Pena is a 70 y.o. male with a personal history of bladder cancer, who presents today for routine cystoscopy.   He is s/p TURBT on 07/19/2020 that revealed high-grade noninvasive urothelial carcinoma; 1 cm. There were no other bladder lesion identified and the muscularis propria was appreciated and not involved.   RUS at the time of TURBT was unremarkable.   In March 2022 he completed BCG x6.    Vitals:   04/24/21 0829  BP: (!) 147/87  Pulse: 88   NED. A&Ox3.   No respiratory distress   Abd soft, NT, ND Normal phallus with bilateral descended testicles  Cystoscopy Procedure Note  Patient identification was confirmed, informed consent was obtained, and patient was prepped using Betadine solution.  Lidocaine jelly was administered per urethral meatus.     Pre-Procedure: - Inspection reveals a normal caliber ureteral meatus.  Procedure: The flexible cystoscope was introduced without difficulty - No urethral strictures/lesions are present. - Enlarged prostate  - Elevated bladder neck - Bilateral ureteral orifices identified - Bladder mucosa  reveals no ulcers, tumors, or lesions - No bladder stones - No trabeculation  Retroflexion shows unremarkable    Post-Procedure: - Patient tolerated the procedure well  Assessment/ Plan:  History of bladder cancer  - NED  - Cystoscopy +/- cytology every 3 months for 2 years, then 6-12 months for years 3 and 4, and then annually considering he falls into the intermediate risk category - He was given the option for maintenance BCG; will hold off unless he has recurrence, he is agreeable with this plan - Urinalysis; complete   BPH with urinary obstruction  - Managed on Flomax   Follow-up in 3 months for cystoscopy   I,Kailey Littlejohn,acting as a scribe for Hollice Espy, MD.,have documented all relevant documentation on the behalf of  Hollice Espy, MD,as directed by  Hollice Espy, MD while in the presence of Hollice Espy, MD.  I have reviewed the above documentation for accuracy and completeness, and I agree with the above.   Hollice Espy, MD

## 2021-04-24 ENCOUNTER — Encounter: Payer: Self-pay | Admitting: Urology

## 2021-04-24 ENCOUNTER — Other Ambulatory Visit: Payer: Self-pay

## 2021-04-24 ENCOUNTER — Ambulatory Visit: Payer: Medicare HMO | Admitting: Urology

## 2021-04-24 VITALS — BP 147/87 | HR 88 | Ht 72.0 in | Wt 215.0 lb

## 2021-04-24 DIAGNOSIS — Z8551 Personal history of malignant neoplasm of bladder: Secondary | ICD-10-CM

## 2021-05-01 LAB — URINALYSIS, COMPLETE
Bilirubin, UA: NEGATIVE
Glucose, UA: NEGATIVE
Ketones, UA: NEGATIVE
Leukocytes,UA: NEGATIVE
Nitrite, UA: NEGATIVE
Protein,UA: NEGATIVE
RBC, UA: NEGATIVE
Specific Gravity, UA: 1.015 (ref 1.005–1.030)
Urobilinogen, Ur: 1 mg/dL (ref 0.2–1.0)
pH, UA: 6.5 (ref 5.0–7.5)

## 2021-05-01 LAB — MICROSCOPIC EXAMINATION
Bacteria, UA: NONE SEEN
Epithelial Cells (non renal): NONE SEEN /hpf (ref 0–10)

## 2021-05-07 DIAGNOSIS — I209 Angina pectoris, unspecified: Secondary | ICD-10-CM | POA: Diagnosis present

## 2021-05-08 ENCOUNTER — Ambulatory Visit
Admission: RE | Admit: 2021-05-08 | Discharge: 2021-05-08 | Disposition: A | Discharge status: Home / Self Care | Payer: Medicare HMO | Attending: Internal Medicine | Admitting: Internal Medicine

## 2021-05-08 ENCOUNTER — Encounter: Payer: Self-pay | Admitting: Internal Medicine

## 2021-05-08 ENCOUNTER — Other Ambulatory Visit: Payer: Self-pay

## 2021-05-08 ENCOUNTER — Encounter
Admission: RE | Disposition: A | Discharge status: Home / Self Care | Payer: Self-pay | Source: Home / Self Care | Attending: Internal Medicine

## 2021-05-08 DIAGNOSIS — R9439 Abnormal result of other cardiovascular function study: Secondary | ICD-10-CM

## 2021-05-08 DIAGNOSIS — R0602 Shortness of breath: Secondary | ICD-10-CM | POA: Insufficient documentation

## 2021-05-08 DIAGNOSIS — I252 Old myocardial infarction: Secondary | ICD-10-CM | POA: Insufficient documentation

## 2021-05-08 DIAGNOSIS — I2510 Atherosclerotic heart disease of native coronary artery without angina pectoris: Secondary | ICD-10-CM

## 2021-05-08 DIAGNOSIS — I2582 Chronic total occlusion of coronary artery: Secondary | ICD-10-CM | POA: Diagnosis not present

## 2021-05-08 DIAGNOSIS — I10 Essential (primary) hypertension: Secondary | ICD-10-CM | POA: Diagnosis not present

## 2021-05-08 DIAGNOSIS — E785 Hyperlipidemia, unspecified: Secondary | ICD-10-CM | POA: Insufficient documentation

## 2021-05-08 DIAGNOSIS — I209 Angina pectoris, unspecified: Secondary | ICD-10-CM | POA: Diagnosis present

## 2021-05-08 HISTORY — PX: LEFT HEART CATH AND CORONARY ANGIOGRAPHY: CATH118249

## 2021-05-08 HISTORY — DX: Atherosclerotic heart disease of native coronary artery without angina pectoris: I25.10

## 2021-05-08 SURGERY — LEFT HEART CATH AND CORONARY ANGIOGRAPHY
Anesthesia: Moderate Sedation | Laterality: Left

## 2021-05-08 MED ORDER — LIDOCAINE HCL 1 % IJ SOLN
INTRAMUSCULAR | Status: AC
  Filled 2021-05-08: qty 20

## 2021-05-08 MED ORDER — SODIUM CHLORIDE 0.9 % IV SOLN: 250.0000 mL | INTRAVENOUS | Status: DC | PRN

## 2021-05-08 MED ORDER — VERAPAMIL HCL 2.5 MG/ML IV SOLN
INTRAVENOUS | Status: AC
  Filled 2021-05-08: qty 2

## 2021-05-08 MED ORDER — SODIUM CHLORIDE 0.9 % WEIGHT BASED INFUSION: 1.0000 mL/kg/h | INTRAVENOUS | Status: DC

## 2021-05-08 MED ORDER — SODIUM CHLORIDE 0.9 % WEIGHT BASED INFUSION
3.0000 mL/kg/h | INTRAVENOUS | Status: AC
  Administered 2021-05-08: 3 mL/kg/h via INTRAVENOUS

## 2021-05-08 MED ORDER — ASPIRIN 81 MG PO CHEW: 81.0000 mg | CHEWABLE_TABLET | ORAL | Status: DC

## 2021-05-08 MED ORDER — HEPARIN SODIUM (PORCINE) 1000 UNIT/ML IJ SOLN
INTRAMUSCULAR | Status: AC
  Filled 2021-05-08: qty 1

## 2021-05-08 MED ORDER — HEPARIN SODIUM (PORCINE) 1000 UNIT/ML IJ SOLN
INTRAMUSCULAR | Status: DC | PRN
  Administered 2021-05-08: 5000 [IU] via INTRAVENOUS

## 2021-05-08 MED ORDER — ACETAMINOPHEN 325 MG PO TABS: 650.0000 mg | ORAL_TABLET | ORAL | Status: DC | PRN

## 2021-05-08 MED ORDER — ONDANSETRON HCL 4 MG/2ML IJ SOLN: 4.0000 mg | Freq: Four times a day (QID) | INTRAMUSCULAR | Status: DC | PRN

## 2021-05-08 MED ORDER — HEPARIN (PORCINE) IN NACL 1000-0.9 UT/500ML-% IV SOLN
INTRAVENOUS | Status: AC
  Filled 2021-05-08: qty 1000

## 2021-05-08 MED ORDER — FENTANYL CITRATE (PF) 100 MCG/2ML IJ SOLN
INTRAMUSCULAR | Status: AC
  Filled 2021-05-08: qty 2

## 2021-05-08 MED ORDER — SODIUM CHLORIDE 0.9% FLUSH: 3.0000 mL | INTRAVENOUS | Status: DC | PRN

## 2021-05-08 MED ORDER — LIDOCAINE HCL (PF) 1 % IJ SOLN
INTRAMUSCULAR | Status: DC | PRN
  Administered 2021-05-08: 2 mL

## 2021-05-08 MED ORDER — MIDAZOLAM HCL 2 MG/2ML IJ SOLN
INTRAMUSCULAR | Status: AC
  Filled 2021-05-08: qty 2

## 2021-05-08 MED ORDER — VERAPAMIL HCL 2.5 MG/ML IV SOLN
INTRAVENOUS | Status: DC | PRN
  Administered 2021-05-08: 2.5 mg via INTRAVENOUS

## 2021-05-08 MED ORDER — SODIUM CHLORIDE 0.9% FLUSH: 3.0000 mL | Freq: Two times a day (BID) | INTRAVENOUS | Status: DC

## 2021-05-08 MED ORDER — SODIUM CHLORIDE 0.9 % WEIGHT BASED INFUSION
1.0000 mL/kg/h | INTRAVENOUS | Status: DC
  Administered 2021-05-08: 1 mL/kg/h via INTRAVENOUS

## 2021-05-08 MED ORDER — HEPARIN (PORCINE) IN NACL 1000-0.9 UT/500ML-% IV SOLN
INTRAVENOUS | Status: DC | PRN
  Administered 2021-05-08 (×2): 500 mL

## 2021-05-08 MED ORDER — IOHEXOL 350 MG/ML SOLN
INTRAVENOUS | Status: DC | PRN
  Administered 2021-05-08: 57 mL

## 2021-05-08 MED ORDER — MIDAZOLAM HCL 2 MG/2ML IJ SOLN
INTRAMUSCULAR | Status: DC | PRN
  Administered 2021-05-08: 1 mg via INTRAVENOUS

## 2021-05-08 MED ORDER — FENTANYL CITRATE (PF) 100 MCG/2ML IJ SOLN
INTRAMUSCULAR | Status: DC | PRN
  Administered 2021-05-08: 25 ug via INTRAVENOUS

## 2021-05-08 MED ORDER — HYDRALAZINE HCL 20 MG/ML IJ SOLN: 10.0000 mg | INTRAMUSCULAR | Status: DC | PRN

## 2021-05-08 MED ORDER — LABETALOL HCL 5 MG/ML IV SOLN: 10.0000 mg | INTRAVENOUS | Status: DC | PRN

## 2021-05-08 SURGICAL SUPPLY — 10 items
CATH INFINITI 5 FR JL3.5 (CATHETERS) ×2 IMPLANT
CATH INFINITI JR4 5F (CATHETERS) ×2 IMPLANT
DEVICE RAD TR BAND REGULAR (VASCULAR PRODUCTS) ×2 IMPLANT
DRAPE BRACHIAL (DRAPES) ×2 IMPLANT
GLIDESHEATH SLEND SS 6F .021 (SHEATH) ×2 IMPLANT
PACK CARDIAC CATH (CUSTOM PROCEDURE TRAY) ×2 IMPLANT
PROTECTION STATION PRESSURIZED (MISCELLANEOUS) ×2
SET ATX SIMPLICITY (MISCELLANEOUS) ×2 IMPLANT
STATION PROTECTION PRESSURIZED (MISCELLANEOUS) ×1 IMPLANT
WIRE HI TORQ VERSACORE J 260CM (WIRE) ×2 IMPLANT

## 2021-05-09 ENCOUNTER — Encounter: Payer: Self-pay | Admitting: Internal Medicine

## 2021-05-14 HISTORY — PX: CORONARY ARTERY BYPASS GRAFT: SHX141

## 2021-05-29 DIAGNOSIS — Z951 Presence of aortocoronary bypass graft: Secondary | ICD-10-CM

## 2021-05-29 HISTORY — DX: Presence of aortocoronary bypass graft: Z95.1

## 2021-05-29 HISTORY — PX: CORONARY ARTERY BYPASS GRAFT: SHX141

## 2021-05-30 DIAGNOSIS — Z951 Presence of aortocoronary bypass graft: Secondary | ICD-10-CM | POA: Insufficient documentation

## 2021-05-30 DIAGNOSIS — I4891 Unspecified atrial fibrillation: Secondary | ICD-10-CM | POA: Insufficient documentation

## 2021-05-31 DIAGNOSIS — Z8679 Personal history of other diseases of the circulatory system: Secondary | ICD-10-CM | POA: Insufficient documentation

## 2021-06-19 ENCOUNTER — Other Ambulatory Visit: Payer: Self-pay

## 2021-06-19 ENCOUNTER — Encounter: Payer: Medicare HMO | Attending: Internal Medicine | Admitting: *Deleted

## 2021-06-19 DIAGNOSIS — Z951 Presence of aortocoronary bypass graft: Secondary | ICD-10-CM

## 2021-06-19 DIAGNOSIS — Z48812 Encounter for surgical aftercare following surgery on the circulatory system: Secondary | ICD-10-CM | POA: Insufficient documentation

## 2021-06-19 NOTE — Progress Notes (Signed)
Initial telephone orientation completed. Diagnosis can be found in [facility] 11/15. EP orientation scheduled for Wednesday 12/28 at 8am.

## 2021-07-10 ENCOUNTER — Other Ambulatory Visit: Payer: Self-pay

## 2021-07-10 VITALS — Ht 72.0 in | Wt 216.0 lb

## 2021-07-10 DIAGNOSIS — Z951 Presence of aortocoronary bypass graft: Secondary | ICD-10-CM

## 2021-07-10 DIAGNOSIS — Z48812 Encounter for surgical aftercare following surgery on the circulatory system: Secondary | ICD-10-CM | POA: Diagnosis present

## 2021-07-10 NOTE — Patient Instructions (Signed)
Patient Instructions  Patient Details  Name: Christian Pena MRN: [id number] Date of Birth: Feb 20, 1951 Referring Provider:  Corey Skains, MD  Below are your personal goals for exercise, nutrition, and risk factors. Our goal is to help you stay on track towards obtaining and maintaining these goals. We will be discussing your progress on these goals with you throughout the program.  Initial Exercise Prescription:  Initial Exercise Prescription - 07/10/21 0900       Date of Initial Exercise RX and Referring Provider   Date 07/10/21    Referring Provider [name]      Oxygen   Maintain Oxygen Saturation 88% or higher      Treadmill   MPH 2.3    Grade 0.5    Minutes 15    METs 2.9      Recumbant Bike   Level 3    RPM 60    Minutes 15    METs 2.9      REL-XR   Level 3    Speed 50    Minutes 15    METs 2.9      T5 Nustep   Level 2    SPM 80    Minutes 15    METs 2.9      Prescription Details   Frequency (times per week) 3    Duration Progress to 30 minutes of continuous aerobic without signs/symptoms of physical distress      Intensity   THRR 40-80% of Max Heartrate 96-133    Ratings of Perceived Exertion 11-13    Perceived Dyspnea 0-4      Resistance Training   Training Prescription Yes    Weight 3 lb    Reps 10-15             Exercise Goals: Frequency: Be able to perform aerobic exercise two to three times per week in program working toward 2-5 days per week of home exercise.  Intensity: Work with a perceived exertion of 11 (fairly light) - 15 (hard) while following your exercise prescription.  We will make changes to your prescription with you as you progress through the program.   Duration: Be able to do 30 to 45 minutes of continuous aerobic exercise in addition to a 5 minute warm-up and a 5 minute cool-down routine.   Nutrition Goals: Your personal nutrition goals will be established when you do your nutrition analysis with the  dietician.  The following are general nutrition guidelines to follow: Cholesterol < 200mg /day Sodium < 1500mg /day Fiber: Men over 50 yrs - 30 grams per day  Personal Goals:  Personal Goals and Risk Factors at Admission - 07/10/21 0941       Core Components/Risk Factors/Patient Goals on Admission   Number of packs per day 0   quit 05/29/21 - vaping quit cigarettes 6/22   Intervention Assist the participant in steps to quit. Provide individualized education and counseling about committing to Tobacco Cessation, relapse prevention, and pharmacological support that can be provided by physician.;Advice worker, assist with locating and accessing local/national Quit Smoking programs, and support quit date choice.    Expected Outcomes Short Term: Will quit all tobacco product use, adhering to prevention of relapse plan.;Long Term: Complete abstinence from all tobacco products for at least 12 months from quit date.    Hypertension Yes    Intervention Provide education on lifestyle modifcations including regular physical activity/exercise, weight management, moderate sodium restriction and increased consumption of fresh fruit, vegetables, and low fat  dairy, alcohol moderation, and smoking cessation.;Monitor prescription use compliance.    Expected Outcomes Short Term: Continued assessment and intervention until BP is < 140/27mm HG in hypertensive participants. < 130/42mm HG in hypertensive participants with diabetes, heart failure or chronic kidney disease.;Long Term: Maintenance of blood pressure at goal levels.    Lipids Yes    Intervention Provide education and support for participant on nutrition & aerobic/resistive exercise along with prescribed medications to achieve LDL 70mg , HDL >40mg .    Expected Outcomes Short Term: Participant states understanding of desired cholesterol values and is compliant with medications prescribed. Participant is following exercise prescription and  nutrition guidelines.;Long Term: Cholesterol controlled with medications as prescribed, with individualized exercise RX and with personalized nutrition plan. Value goals: LDL < 70mg , HDL > 40 mg.             Tobacco Use Initial Evaluation: Social History   Tobacco Use  Smoking Status Former   Types: Cigarettes  Smokeless Tobacco Never    Exercise Goals and Review:  Exercise Goals     Row Name 07/10/21 0930             Exercise Goals   Increase Physical Activity Yes       Intervention Provide advice, education, support and counseling about physical activity/exercise needs.;Develop an individualized exercise prescription for aerobic and resistive training based on initial evaluation findings, risk stratification, comorbidities and participant's personal goals.       Expected Outcomes Short Term: Attend rehab on a regular basis to increase amount of physical activity.;Long Term: Add in home exercise to make exercise part of routine and to increase amount of physical activity.;Long Term: Exercising regularly at least 3-5 days a week.       Increase Strength and Stamina Yes       Intervention Provide advice, education, support and counseling about physical activity/exercise needs.;Develop an individualized exercise prescription for aerobic and resistive training based on initial evaluation findings, risk stratification, comorbidities and participant's personal goals.       Expected Outcomes Short Term: Increase workloads from initial exercise prescription for resistance, speed, and METs.;Short Term: Perform resistance training exercises routinely during rehab and add in resistance training at home;Long Term: Improve cardiorespiratory fitness, muscular endurance and strength as measured by increased METs and functional capacity (6MWT)       Able to understand and use rate of perceived exertion (RPE) scale Yes       Intervention Provide education and explanation on how to use RPE scale        Expected Outcomes Short Term: Able to use RPE daily in rehab to express subjective intensity level;Long Term:  Able to use RPE to guide intensity level when exercising independently       Able to understand and use Dyspnea scale Yes       Intervention Provide education and explanation on how to use Dyspnea scale       Expected Outcomes Short Term: Able to use Dyspnea scale daily in rehab to express subjective sense of shortness of breath during exertion;Long Term: Able to use Dyspnea scale to guide intensity level when exercising independently       Knowledge and understanding of Target Heart Rate Range (THRR) Yes       Intervention Provide education and explanation of THRR including how the numbers were predicted and where they are located for reference       Expected Outcomes Short Term: Able to state/look up THRR;Short Term: Able to use  daily as guideline for intensity in rehab;Long Term: Able to use THRR to govern intensity when exercising independently       Able to check pulse independently Yes       Intervention Provide education and demonstration on how to check pulse in carotid and radial arteries.;Review the importance of being able to check your own pulse for safety during independent exercise       Expected Outcomes Short Term: Able to explain why pulse checking is important during independent exercise;Long Term: Able to check pulse independently and accurately       Understanding of Exercise Prescription Yes       Intervention Provide education, explanation, and written materials on patient's individual exercise prescription       Expected Outcomes Short Term: Able to explain program exercise prescription;Long Term: Able to explain home exercise prescription to exercise independently                Copy of goals given to participant.

## 2021-07-10 NOTE — Progress Notes (Signed)
Cardiac Individual Treatment Plan  Patient Details  Name: Christian Pena MRN: [id number] Date of Birth: Apr 05, 1951 Referring Provider:   Flowsheet Row Cardiac Rehab from 07/10/2021 in [facility] Cardiac and Pulmonary Rehab  Referring Provider [name]       Initial Encounter Date:  Flowsheet Row Cardiac Rehab from 07/10/2021 in [facility] Cardiac and Pulmonary Rehab  Date 07/10/21       Visit Diagnosis: S/P CABG x 3  Patient's Home Medications on Admission:  Current Outpatient Medications:    aspirin 81 MG EC tablet, Take 81 mg by mouth daily., Disp: , Rfl:    gabapentin (NEURONTIN) 100 MG capsule, Take 1 capsule by mouth 3 (three) times daily., Disp: , Rfl:    isosorbide mononitrate (IMDUR) 30 MG 24 hr tablet, Take 30 mg by mouth daily. (Patient not taking: Reported on 06/19/2021), Disp: , Rfl:    lisinopril (ZESTRIL) 10 MG tablet, Take 10 mg by mouth daily., Disp: , Rfl:    metoprolol tartrate (LOPRESSOR) 25 MG tablet, Take 12.5 mg by mouth 2 (two) times daily., Disp: , Rfl:    Multiple Vitamins-Minerals (MULTIVITAMIN WITH MINERALS) tablet, Take 1 tablet by mouth daily. Adult 50 +, Disp: , Rfl:    omeprazole (PRILOSEC) 20 MG capsule, Take 20 mg by mouth daily., Disp: , Rfl:    rosuvastatin (CRESTOR) 40 MG tablet, Take 40 mg by mouth daily., Disp: , Rfl:    tamsulosin (FLOMAX) 0.4 MG CAPS capsule, Take 1 capsule (0.4 mg total) by mouth daily., Disp: 30 capsule, Rfl: 11  Past Medical History: Past Medical History:  Diagnosis Date   GERD (gastroesophageal reflux disease)    Hypertension     Tobacco Use: Social History   Tobacco Use  Smoking Status Former   Types: Cigarettes  Smokeless Tobacco Never    Labs: Recent Review Flowsheet Data   There is no flowsheet data to display.      Exercise Target Goals: Exercise Program Goal: Individual exercise prescription set using results from initial 6 min walk test and THRR while considering  patients activity barriers and safety.    Exercise Prescription Goal: Initial exercise prescription builds to 30-45 minutes a day of aerobic activity, 2-3 days per week.  Home exercise guidelines will be given to patient during program as part of exercise prescription that the participant will acknowledge.   Education: Aerobic Exercise: - Group verbal and visual presentation on the components of exercise prescription. Introduces F.I.T.T principle from ACSM for exercise prescriptions.  Reviews F.I.T.T. principles of aerobic exercise including progression. Written material given at graduation.   Education: Resistance Exercise: - Group verbal and visual presentation on the components of exercise prescription. Introduces F.I.T.T principle from ACSM for exercise prescriptions  Reviews F.I.T.T. principles of resistance exercise including progression. Written material given at graduation.    Education: Exercise & Equipment Safety: - Individual verbal instruction and demonstration of equipment use and safety with use of the equipment. Flowsheet Row Cardiac Rehab from 07/10/2021 in [facility] Cardiac and Pulmonary Rehab  Date 07/10/21  Educator AS  Instruction Review Code 1- Verbalizes Understanding       Education: Exercise Physiology & General Exercise Guidelines: - Group verbal and written instruction with models to review the exercise physiology of the cardiovascular system and associated critical values. Provides general exercise guidelines with specific guidelines to those with heart or lung disease.  Flowsheet Row Cardiac Rehab from 07/10/2021 in [facility] Cardiac and Pulmonary Rehab  Education need identified 07/10/21  Education: Flexibility, Balance, Mind/Body Relaxation: - Group verbal and visual presentation with interactive activity on the components of exercise prescription. Introduces F.I.T.T principle from ACSM for exercise prescriptions. Reviews F.I.T.T. principles of flexibility and balance exercise training including  progression. Also discusses the mind body connection.  Reviews various relaxation techniques to help reduce and manage stress (i.e. Deep breathing, progressive muscle relaxation, and visualization). Balance handout provided to take home. Written material given at graduation.   Activity Barriers & Risk Stratification:  Activity Barriers & Cardiac Risk Stratification - 06/19/21 1402       Activity Barriers & Cardiac Risk Stratification   Activity Barriers Balance Concerns    Cardiac Risk Stratification High             6 Minute Walk:  6 Minute Walk     Row Name 07/10/21 0915         6 Minute Walk   Phase Initial     Distance 1365 feet     Walk Time 6 minutes     # of Rest Breaks 0     MPH 2.6     METS 2.9     RPE 7     Perceived Dyspnea  0     VO2 Peak 10.13     Symptoms Yes (comment)     Comments little dizziness at beginning that went away     Resting HR 59 bpm     Resting BP 114/60     Resting Oxygen Saturation  100 %     Exercise Oxygen Saturation  during 6 min walk 98 %     Max Ex. HR 90 bpm     Max Ex. BP 118/72     2 Minute Post BP 108/68              Oxygen Initial Assessment:   Oxygen Re-Evaluation:   Oxygen Discharge (Final Oxygen Re-Evaluation):   Initial Exercise Prescription:  Initial Exercise Prescription - 07/10/21 0900       Date of Initial Exercise RX and Referring Provider   Date 07/10/21    Referring Provider [name]      Oxygen   Maintain Oxygen Saturation 88% or higher      Treadmill   MPH 2.3    Grade 0.5    Minutes 15    METs 2.9      Recumbant Bike   Level 3    RPM 60    Minutes 15    METs 2.9      REL-XR   Level 3    Speed 50    Minutes 15    METs 2.9      T5 Nustep   Level 2    SPM 80    Minutes 15    METs 2.9      Prescription Details   Frequency (times per week) 3    Duration Progress to 30 minutes of continuous aerobic without signs/symptoms of physical distress      Intensity   THRR 40-80%  of Max Heartrate 96-133    Ratings of Perceived Exertion 11-13    Perceived Dyspnea 0-4      Resistance Training   Training Prescription Yes    Weight 3 lb    Reps 10-15             Perform Capillary Blood Glucose checks as needed.  Exercise Prescription Changes:   Exercise Prescription Changes     Row Name 07/10/21 0900  Response to Exercise   Blood Pressure (Admit) 114/60       Blood Pressure (Exercise) 118/72       Blood Pressure (Exit) 108/68       Heart Rate (Admit) 59 bpm       Heart Rate (Exercise) 90 bpm       Heart Rate (Exit) 77 bpm       Oxygen Saturation (Admit) 100 %       Oxygen Saturation (Exercise) 98 %       Rating of Perceived Exertion (Exercise) 7       Perceived Dyspnea (Exercise) 0       Symptoms little dizzy  dizziness resolved by end of walk                Exercise Comments:   Exercise Goals and Review:   Exercise Goals     Row Name 07/10/21 0930             Exercise Goals   Increase Physical Activity Yes       Intervention Provide advice, education, support and counseling about physical activity/exercise needs.;Develop an individualized exercise prescription for aerobic and resistive training based on initial evaluation findings, risk stratification, comorbidities and participant's personal goals.       Expected Outcomes Short Term: Attend rehab on a regular basis to increase amount of physical activity.;Long Term: Add in home exercise to make exercise part of routine and to increase amount of physical activity.;Long Term: Exercising regularly at least 3-5 days a week.       Increase Strength and Stamina Yes       Intervention Provide advice, education, support and counseling about physical activity/exercise needs.;Develop an individualized exercise prescription for aerobic and resistive training based on initial evaluation findings, risk stratification, comorbidities and participant's personal goals.       Expected  Outcomes Short Term: Increase workloads from initial exercise prescription for resistance, speed, and METs.;Short Term: Perform resistance training exercises routinely during rehab and add in resistance training at home;Long Term: Improve cardiorespiratory fitness, muscular endurance and strength as measured by increased METs and functional capacity (6MWT)       Able to understand and use rate of perceived exertion (RPE) scale Yes       Intervention Provide education and explanation on how to use RPE scale       Expected Outcomes Short Term: Able to use RPE daily in rehab to express subjective intensity level;Long Term:  Able to use RPE to guide intensity level when exercising independently       Able to understand and use Dyspnea scale Yes       Intervention Provide education and explanation on how to use Dyspnea scale       Expected Outcomes Short Term: Able to use Dyspnea scale daily in rehab to express subjective sense of shortness of breath during exertion;Long Term: Able to use Dyspnea scale to guide intensity level when exercising independently       Knowledge and understanding of Target Heart Rate Range (THRR) Yes       Intervention Provide education and explanation of THRR including how the numbers were predicted and where they are located for reference       Expected Outcomes Short Term: Able to state/look up THRR;Short Term: Able to use daily as guideline for intensity in rehab;Long Term: Able to use THRR to govern intensity when exercising independently       Able to check pulse independently Yes  Intervention Provide education and demonstration on how to check pulse in carotid and radial arteries.;Review the importance of being able to check your own pulse for safety during independent exercise       Expected Outcomes Short Term: Able to explain why pulse checking is important during independent exercise;Long Term: Able to check pulse independently and accurately       Understanding of  Exercise Prescription Yes       Intervention Provide education, explanation, and written materials on patient's individual exercise prescription       Expected Outcomes Short Term: Able to explain program exercise prescription;Long Term: Able to explain home exercise prescription to exercise independently                Exercise Goals Re-Evaluation :   Discharge Exercise Prescription (Final Exercise Prescription Changes):  Exercise Prescription Changes - 07/10/21 0900       Response to Exercise   Blood Pressure (Admit) 114/60    Blood Pressure (Exercise) 118/72    Blood Pressure (Exit) 108/68    Heart Rate (Admit) 59 bpm    Heart Rate (Exercise) 90 bpm    Heart Rate (Exit) 77 bpm    Oxygen Saturation (Admit) 100 %    Oxygen Saturation (Exercise) 98 %    Rating of Perceived Exertion (Exercise) 7    Perceived Dyspnea (Exercise) 0    Symptoms little dizzy   dizziness resolved by end of walk            Nutrition:  Target Goals: Understanding of nutrition guidelines, daily intake of sodium 1500mg , cholesterol 200mg , calories 30% from fat and 7% or less from saturated fats, daily to have 5 or more servings of fruits and vegetables.  Education: All About Nutrition: -Group instruction provided by verbal, written material, interactive activities, discussions, models, and posters to present general guidelines for heart healthy nutrition including fat, fiber, MyPlate, the role of sodium in heart healthy nutrition, utilization of the nutrition label, and utilization of this knowledge for meal planning. Follow up email sent as well. Written material given at graduation. Flowsheet Row Cardiac Rehab from 07/10/2021 in [facility] Cardiac and Pulmonary Rehab  Education need identified 07/10/21       Biometrics:  Pre Biometrics - 07/10/21 0931       Pre Biometrics   Height 6' (1.829 m)    Weight 216 lb (98 kg)    BMI (Calculated) 29.29    Single Leg Stand 11.25 seconds               Nutrition Therapy Plan and Nutrition Goals:   Nutrition Assessments:  MEDIFICTS Score Key: ?70 Need to make dietary changes  40-70 Heart Healthy Diet ? 40 Therapeutic Level Cholesterol Diet  Flowsheet Row Cardiac Rehab from 07/10/2021 in [facility] Cardiac and Pulmonary Rehab  Picture Your Plate Total Score on Admission 49      Picture Your Plate Scores: <45 Unhealthy dietary pattern with much room for improvement. 41-50 Dietary pattern unlikely to meet recommendations for good health and room for improvement. 51-60 More healthful dietary pattern, with some room for improvement.  >60 Healthy dietary pattern, although there may be some specific behaviors that could be improved.    Nutrition Goals Re-Evaluation:   Nutrition Goals Discharge (Final Nutrition Goals Re-Evaluation):   Psychosocial: Target Goals: Acknowledge presence or absence of significant depression and/or stress, maximize coping skills, provide positive support system. Participant is able to verbalize types and ability to use techniques and skills needed  for reducing stress and depression.   Education: Stress, Anxiety, and Depression - Group verbal and visual presentation to define topics covered.  Reviews how body is impacted by stress, anxiety, and depression.  Also discusses healthy ways to reduce stress and to treat/manage anxiety and depression.  Written material given at graduation.   Education: Sleep Hygiene -Provides group verbal and written instruction about how sleep can affect your health.  Define sleep hygiene, discuss sleep cycles and impact of sleep habits. Review good sleep hygiene tips.    Initial Review & Psychosocial Screening:  Initial Psych Review & Screening - 06/19/21 1405       Initial Review   Current issues with Current Sleep Concerns      Family Dynamics   Good Support System? Yes   wife     Barriers   Psychosocial barriers to participate in program There are no identifiable  barriers or psychosocial needs.;The patient should benefit from training in stress management and relaxation.      Screening Interventions   Interventions Encouraged to exercise;Provide feedback about the scores to participant;To provide support and resources with identified psychosocial needs    Expected Outcomes Short Term goal: Utilizing psychosocial counselor, staff and physician to assist with identification of specific Stressors or current issues interfering with healing process. Setting desired goal for each stressor or current issue identified.;Long Term Goal: Stressors or current issues are controlled or eliminated.;Short Term goal: Identification and review with participant of any Quality of Life or Depression concerns found by scoring the questionnaire.;Long Term goal: The participant improves quality of Life and PHQ9 Scores as seen by post scores and/or verbalization of changes             Quality of Life Scores:   Scores of 19 and below usually indicate a poorer quality of life in these areas.  A difference of  2-3 points is a clinically meaningful difference.  A difference of 2-3 points in the total score of the Quality of Life Index has been associated with significant improvement in overall quality of life, self-image, physical symptoms, and general health in studies assessing change in quality of life.  PHQ-9: Recent Review Flowsheet Data     Depression screen [facility] 2/9 07/10/2021   Decreased Interest 1   Down, Depressed, Hopeless 0   PHQ - 2 Score 1   Altered sleeping 1   Tired, decreased energy 1   Change in appetite 1   Feeling bad or failure about yourself  0   Trouble concentrating 0   Moving slowly or fidgety/restless 0   Suicidal thoughts 0   PHQ-9 Score 4   Difficult doing work/chores Not difficult at all      Interpretation of Total Score  Total Score Depression Severity:  1-4 = Minimal depression, 5-9 = Mild depression, 10-14 = Moderate depression, 15-19  = Moderately severe depression, 20-27 = Severe depression   Psychosocial Evaluation and Intervention:  Psychosocial Evaluation - 06/19/21 1419       Psychosocial Evaluation & Interventions   Interventions Encouraged to exercise with the program and follow exercise prescription    Comments Mr. Patel reports healing well from his CABG x 3. His biggest concern is some nerve damage during surgery that is causing numbness in his left ring and pinky fever. This is causing sleep issues. The MD gave him gabapentin to try but unfortunately that medicine is causing some balance concerns as well. He is giving it a try as he is tired  of the dull ache. His wife is very supportive of him and has been a great help during his recovery. He is still sleeping in the guest bedroom since it is a lower bed and easier to get out of. He is happily retired. He is ready to get back into his normal routine and is ready to get started in the program    Expected Outcomes Short: attend cardiac rehab for education and exercise. Long; Develop and maintain positive self care habits    Continue Psychosocial Services  Follow up required by staff             Psychosocial Re-Evaluation:   Psychosocial Discharge (Final Psychosocial Re-Evaluation):   Vocational Rehabilitation: Provide vocational rehab assistance to qualifying candidates.   Vocational Rehab Evaluation & Intervention:  Vocational Rehab - 06/19/21 1408       Initial Vocational Rehab Evaluation & Intervention   Assessment shows need for Vocational Rehabilitation No             Education: Education Goals: Education classes will be provided on a variety of topics geared toward better understanding of heart health and risk factor modification. Participant will state understanding/return demonstration of topics presented as noted by education test scores.  Learning Barriers/Preferences:  Learning Barriers/Preferences - 06/19/21 1408       Learning  Barriers/Preferences   Learning Barriers None    Learning Preferences None             General Cardiac Education Topics:  AED/CPR: - Group verbal and written instruction with the use of models to demonstrate the basic use of the AED with the basic ABC's of resuscitation.   Anatomy and Cardiac Procedures: - Group verbal and visual presentation and models provide information about basic cardiac anatomy and function. Reviews the testing methods done to diagnose heart disease and the outcomes of the test results. Describes the treatment choices: Medical Management, Angioplasty, or Coronary Bypass Surgery for treating various heart conditions including Myocardial Infarction, Angina, Valve Disease, and Cardiac Arrhythmias.  Written material given at graduation.   Medication Safety: - Group verbal and visual instruction to review commonly prescribed medications for heart and lung disease. Reviews the medication, class of the drug, and side effects. Includes the steps to properly store meds and maintain the prescription regimen.  Written material given at graduation.   Intimacy: - Group verbal instruction through game format to discuss how heart and lung disease can affect sexual intimacy. Written material given at graduation..   Know Your Numbers and Heart Failure: - Group verbal and visual instruction to discuss disease risk factors for cardiac and pulmonary disease and treatment options.  Reviews associated critical values for Overweight/Obesity, Hypertension, Cholesterol, and Diabetes.  Discusses basics of heart failure: signs/symptoms and treatments.  Introduces Heart Failure Zone chart for action plan for heart failure.  Written material given at graduation.   Infection Prevention: - Provides verbal and written material to individual with discussion of infection control including proper hand washing and proper equipment cleaning during exercise session. Flowsheet Row Cardiac Rehab  from 07/10/2021 in [facility] Cardiac and Pulmonary Rehab  Date 07/10/21  Educator AS  Instruction Review Code 1- Verbalizes Understanding       Falls Prevention: - Provides verbal and written material to individual with discussion of falls prevention and safety. Flowsheet Row Cardiac Rehab from 07/10/2021 in [facility] Cardiac and Pulmonary Rehab  Date 07/10/21  Educator AS  Instruction Review Code 1- Verbalizes Understanding  Other: -Provides group and verbal instruction on various topics (see comments)   Knowledge Questionnaire Score:  Knowledge Questionnaire Score - 07/10/21 0933       Knowledge Questionnaire Score   Pre Score 21/26             Core Components/Risk Factors/Patient Goals at Admission:  Personal Goals and Risk Factors at Admission - 06/19/21 1408       Core Components/Risk Factors/Patient Goals on Admission   Hypertension Yes    Intervention Provide education on lifestyle modifcations including regular physical activity/exercise, weight management, moderate sodium restriction and increased consumption of fresh fruit, vegetables, and low fat dairy, alcohol moderation, and smoking cessation.;Monitor prescription use compliance.    Expected Outcomes Short Term: Continued assessment and intervention until BP is < 140/64mm HG in hypertensive participants. < 130/5mm HG in hypertensive participants with diabetes, heart failure or chronic kidney disease.;Long Term: Maintenance of blood pressure at goal levels.    Lipids Yes    Intervention Provide education and support for participant on nutrition & aerobic/resistive exercise along with prescribed medications to achieve LDL 70mg , HDL >40mg .    Expected Outcomes Short Term: Participant states understanding of desired cholesterol values and is compliant with medications prescribed. Participant is following exercise prescription and nutrition guidelines.;Long Term: Cholesterol controlled with medications as prescribed,  with individualized exercise RX and with personalized nutrition plan. Value goals: LDL < 70mg , HDL > 40 mg.             Education:Diabetes - Individual verbal and written instruction to review signs/symptoms of diabetes, desired ranges of glucose level fasting, after meals and with exercise. Acknowledge that pre and post exercise glucose checks will be done for 3 sessions at entry of program.   Core Components/Risk Factors/Patient Goals Review:    Core Components/Risk Factors/Patient Goals at Discharge (Final Review):    ITP Comments:  ITP Comments     Row Name 06/19/21 1415           ITP Comments Initial telephone orientation completed. Diagnosis can be found in [facility] 11/15. EP orientation scheduled for Wednesday 12/28 at 8am.                Comments: initial ITP

## 2021-07-17 ENCOUNTER — Other Ambulatory Visit: Payer: Self-pay

## 2021-07-17 ENCOUNTER — Encounter: Payer: Medicare HMO | Attending: Internal Medicine

## 2021-07-17 DIAGNOSIS — Z951 Presence of aortocoronary bypass graft: Secondary | ICD-10-CM | POA: Insufficient documentation

## 2021-07-17 NOTE — Progress Notes (Signed)
Daily Session Note  Patient Details  Name: Christian Pena MRN: [id number] Date of Birth: 10/07/1950 Referring Provider:   Flowsheet Row Cardiac Rehab from 07/10/2021 in [facility] Cardiac and Pulmonary Rehab  Referring Provider [name]       Encounter Date: 07/17/2021  Check In:  Session Check In - 07/17/21 0753       Check-In   Supervising physician immediately available to respond to emergencies See telemetry face sheet for immediately available ER MD    Location [facility]-Cardiac & Pulmonary Rehab    Staff Present Birdie Sons, MPA, RN;Amanda Sommer, BA, ACSM CEP, Exercise Physiologist;Joseph Tessie Fass, [place]    Virtual Visit No    Medication changes reported     Yes    Comments BP med reduced to 1/2 dose    Fall or balance concerns reported    No    Tobacco Cessation No Change    Warm-up and Cool-down Performed on first and last piece of equipment    Resistance Training Performed Yes    VAD Patient? No    PAD/SET Patient? No      Pain Assessment   Currently in Pain? No/denies                Social History   Tobacco Use  Smoking Status Former   Types: Cigarettes  Smokeless Tobacco Never    Goals Met:  Independence with exercise equipment Exercise tolerated well No report of concerns or symptoms today Strength training completed today  Goals Unmet:  Not Applicable  Comments: First full day of exercise!  Patient was oriented to gym and equipment including functions, settings, policies, and procedures.  Patient's individual exercise prescription and treatment plan were reviewed.  All starting workloads were established based on the results of the 6 minute walk test done at initial orientation visit.  The plan for exercise progression was also introduced and progression will be customized based on patient's performance and goals.    Dr. Emily Filbert is Medical Director for [place].  Dr. Ottie Glazier is Medical Director for [facility]  Pulmonary Rehabilitation.

## 2021-07-19 ENCOUNTER — Encounter: Payer: Medicare HMO | Admitting: *Deleted

## 2021-07-19 ENCOUNTER — Other Ambulatory Visit: Payer: Self-pay

## 2021-07-19 DIAGNOSIS — Z951 Presence of aortocoronary bypass graft: Secondary | ICD-10-CM | POA: Diagnosis not present

## 2021-07-19 NOTE — Progress Notes (Signed)
Daily Session Note  Patient Details  Name: Christian Pena MRN: [id number] Date of Birth: Apr 08, 1951 Referring Provider:   Flowsheet Row Cardiac Rehab from 07/10/2021 in [facility] Cardiac and Pulmonary Rehab  Referring Provider [name]       Encounter Date: 07/19/2021  Check In:  Session Check In - 07/19/21 0839       Check-In   Supervising physician immediately available to respond to emergencies See telemetry face sheet for immediately available ER MD    Location [facility]-Cardiac & Pulmonary Rehab    Staff Present Alberteen Sam, MA, RCEP, CCRP, CCET;Joseph [place], [place];Heath Lark, RN, BSN, CCRP    Virtual Visit No    Medication changes reported     No    Fall or balance concerns reported    No    Warm-up and Cool-down Performed on first and last piece of equipment    Resistance Training Performed Yes    VAD Patient? No    PAD/SET Patient? No      Pain Assessment   Currently in Pain? No/denies                Social History   Tobacco Use  Smoking Status Former   Types: Cigarettes  Smokeless Tobacco Never    Goals Met:  Independence with exercise equipment Exercise tolerated well No report of concerns or symptoms today  Goals Unmet:  Not Applicable  Comments: Pt able to follow exercise prescription today without complaint.  Will continue to monitor for progression.    Dr. Emily Filbert is Medical Director for [place].  Dr. Ottie Glazier is Medical Director for [facility] Pulmonary Rehabilitation.

## 2021-07-22 ENCOUNTER — Other Ambulatory Visit: Payer: Self-pay

## 2021-07-22 ENCOUNTER — Encounter: Payer: Medicare HMO | Admitting: *Deleted

## 2021-07-22 DIAGNOSIS — Z951 Presence of aortocoronary bypass graft: Secondary | ICD-10-CM | POA: Diagnosis not present

## 2021-07-22 NOTE — Progress Notes (Signed)
Completed initial RD consultation ?

## 2021-07-22 NOTE — Progress Notes (Signed)
Daily Session Note  Patient Details  Name: Christian Pena MRN: [id number] Date of Birth: 07-25-1950 Referring Provider:   Flowsheet Row Cardiac Rehab from 07/10/2021 in [facility] Cardiac and Pulmonary Rehab  Referring Provider [name]       Encounter Date: 07/22/2021  Check In:  Session Check In - 07/22/21 0801       Check-In   Supervising physician immediately available to respond to emergencies See telemetry face sheet for immediately available ER MD    Location [facility]-Cardiac & Pulmonary Rehab    Staff Present Justin Mend, Jaci Carrel, BS, ACSM CEP, Exercise Physiologist;Star Cheese, RN, BSN, CCRP    Virtual Visit No    Medication changes reported     No    Fall or balance concerns reported    No    Warm-up and Cool-down Performed on first and last piece of equipment    Resistance Training Performed Yes    VAD Patient? No    PAD/SET Patient? No      Pain Assessment   Currently in Pain? No/denies                Social History   Tobacco Use  Smoking Status Former   Types: Cigarettes  Smokeless Tobacco Never    Goals Met:  Independence with exercise equipment Exercise tolerated well Personal goals reviewed No report of concerns or symptoms today  Goals Unmet:  Not Applicable  Comments: Pt able to follow exercise prescription today without complaint.  Will continue to monitor for progression.  Reviewed home exercise with pt today.  Pt plans to use RB and walk for exercise.  Reviewed THR, pulse, RPE, sign and symptoms, pulse oximetery and when to call 911 or MD.  Also discussed weather considerations and indoor options.  Pt voiced understanding.     Dr. Emily Filbert is Medical Director for [place].  Dr. Ottie Glazier is Medical Director for [facility] Pulmonary Rehabilitation.

## 2021-07-24 ENCOUNTER — Other Ambulatory Visit: Payer: Self-pay

## 2021-07-24 DIAGNOSIS — Z951 Presence of aortocoronary bypass graft: Secondary | ICD-10-CM | POA: Diagnosis not present

## 2021-07-24 NOTE — Progress Notes (Signed)
Daily Session Note  Patient Details  Name: Christian Pena MRN: [id number] Date of Birth: [birth year] Referring Provider:   Flowsheet Row Cardiac Rehab from 07/10/2021 in [facility] Cardiac and Pulmonary Rehab  Referring Provider [name]       Encounter Date: 07/24/2021  Check In:  Session Check In - 07/24/21 0737       Check-In   Supervising physician immediately available to respond to emergencies See telemetry face sheet for immediately available ER MD    Location [facility]Cardiac & Pulmonary Rehab    Staff Present Birdie Sons, MPA, RN;Joseph [place], [place], [place], RCEP, CCRP, CCET    Virtual Visit No    Medication changes reported     No    Fall or balance concerns reported    No    Tobacco Cessation No Change    Warm-up and Cool-down Performed on first and last piece of equipment    Resistance Training Performed Yes    VAD Patient? No    PAD/SET Patient? No      Pain Assessment   Currently in Pain? No/denies                Social History   Tobacco Use  Smoking Status Former   Types: Cigarettes  Smokeless Tobacco Never    Goals Met:  Independence with exercise equipment Exercise tolerated well No report of concerns or symptoms today Strength training completed today  Goals Unmet:  Not Applicable  Comments: Pt able to follow exercise prescription today without complaint.  Will continue to monitor for progression.    Dr. Emily Filbert is Medical Director for [place].  Dr. Ottie Glazier is Medical Director for [facility] Pulmonary Rehabilitation.

## 2021-07-26 ENCOUNTER — Other Ambulatory Visit: Payer: Self-pay

## 2021-07-26 ENCOUNTER — Encounter: Payer: Medicare HMO | Admitting: *Deleted

## 2021-07-26 DIAGNOSIS — Z951 Presence of aortocoronary bypass graft: Secondary | ICD-10-CM | POA: Diagnosis not present

## 2021-07-26 NOTE — Progress Notes (Signed)
Daily Session Note  Patient Details  Name: Christian Pena MRN: [id number] Date of Birth: 03-Oct-1950 Referring Provider:   Flowsheet Row Cardiac Rehab from 07/10/2021 in [facility] Cardiac and Pulmonary Rehab  Referring Provider Christian Pena       Encounter Date: 07/26/2021  Check In:  Session Check In - 07/26/21 0834       Check-In   Supervising physician immediately available to respond to emergencies See telemetry face sheet for immediately available ER MD    Location [facility]Cardiac & Pulmonary Rehab    Staff Present Alberteen Sam, MA, RCEP, CCRP, CCET;Zamia Tyminski, RN, BSN, CCRP    Virtual Visit No    Medication changes reported     No    Fall or balance concerns reported    No    Warm-up and Cool-down Performed on first and last piece of equipment    Resistance Training Performed Yes    VAD Patient? No    PAD/SET Patient? No      Pain Assessment   Currently in Pain? No/denies                Social History   Tobacco Use  Smoking Status Former   Types: Cigarettes  Smokeless Tobacco Never    Goals Met:  Independence with exercise equipment Exercise tolerated well No report of concerns or symptoms today  Goals Unmet:  Not Applicable  Comments: Pt able to follow exercise prescription today without complaint.  Will continue to monitor for progression.    Dr. Emily Filbert is Medical Director for [place].  Dr. Ottie Glazier is Medical Director for [facility] Pulmonary Rehabilitation.

## 2021-07-29 ENCOUNTER — Encounter: Payer: Medicare HMO | Admitting: *Deleted

## 2021-07-29 ENCOUNTER — Other Ambulatory Visit: Payer: Self-pay

## 2021-07-29 DIAGNOSIS — Z951 Presence of aortocoronary bypass graft: Secondary | ICD-10-CM

## 2021-07-29 NOTE — Progress Notes (Signed)
° °  07/30/2021 CC:  Chief Complaint  Patient presents with   Cysto     HPI: Christian Pena is a 71 y.o. male with a personal history of bladder cancer and BPH with urinary obstruction, who presents today for 3 month surveillance cystoscopy.     He is s/p TURBT on 07/19/2020 that revealed high-grade noninvasive urothelial carcinoma; 1 cm. There were no other bladder lesion identified and the muscularis propria was appreciated and not involved.    RUS at the time of TURBT was unremarkable.    In March 2022 he completed BCG x6.   He is s/p CABG x3.     Vitals:   07/30/21 0834  BP: (!) 78/49  Pulse: 71  NED. A&Ox3.   No respiratory distress   Abd soft, NT, ND Normal phallus with bilateral descended testicles  Cystoscopy Procedure Note  Patient identification was confirmed, informed consent was obtained, and patient was prepped using Betadine solution.  Lidocaine jelly was administered per urethral meatus.     Pre-Procedure: - Inspection reveals a normal caliber ureteral meatus.  Procedure: The flexible cystoscope was introduced without difficulty - No urethral strictures/lesions are present. - Enlarged prostate  - Elevated bladder neck - Bilateral ureteral orifices identified - Bladder mucosa  reveals tumor <1 cm on right posterior lateral bladder wall - No bladder stones - No trabeculation  Post-Procedure: - Patient tolerated the procedure well  Assessment/ Plan:  History of bladder cancer  - Cystoscopy today revealed <1cm tumor on right bladder wall consistent with a small reoccurence - Will proceed to operating room for TURBT, bilateral retrograde, and intravesical gemcitabine  -Discussed risks and benefits -will likely consider re induction BCG followed by maintenance pending pathology post op - We will need to get clearance from cardiology especially in light of his recent CABG, he can stay on baby aspirin.  - Urine sent for pre-op culture.   Return to OR  for TURBT.  I,Kailey Littlejohn,acting as a Education administrator for Hollice Espy, MD.,have documented all relevant documentation on the behalf of Hollice Espy, MD,as directed by  Hollice Espy, MD while in the presence of Hollice Espy, MD.  I have reviewed the above documentation for accuracy and completeness, and I agree with the above.   Hollice Espy, MD

## 2021-07-29 NOTE — Progress Notes (Signed)
Daily Session Note  Patient Details  Name: Christian Pena MRN: [id number] Date of Birth: 10/09/1950 Referring Provider:   Flowsheet Row Cardiac Rehab from 07/10/2021 in [facility] Cardiac and Pulmonary Rehab  Referring Provider [name]       Encounter Date: 07/29/2021  Check In:  Session Check In - 07/29/21 0802       Check-In   Supervising physician immediately available to respond to emergencies See telemetry face sheet for immediately available ER MD    Location [facility]-Cardiac & Pulmonary Rehab    Staff Present Earlean Shawl, BS, ACSM CEP, Exercise Physiologist;Joseph [place], [place];Heath Lark, RN, BSN, CCRP    Virtual Visit No    Medication changes reported     No    Fall or balance concerns reported    No    Warm-up and Cool-down Performed on first and last piece of equipment    Resistance Training Performed Yes    VAD Patient? No    PAD/SET Patient? No      Pain Assessment   Currently in Pain? No/denies                Social History   Tobacco Use  Smoking Status Former   Types: Cigarettes  Smokeless Tobacco Never    Goals Met:  Independence with exercise equipment Exercise tolerated well No report of concerns or symptoms today  Goals Unmet:  Not Applicable  Comments: Pt able to follow exercise prescription today without complaint.  Will continue to monitor for progression.    Dr. Emily Filbert is Medical Director for [place].  Dr. Ottie Glazier is Medical Director for [facility] Pulmonary Rehabilitation.

## 2021-07-29 NOTE — H&P (View-Only) (Signed)
° °  07/30/2021 CC:  Chief Complaint  Patient presents with   Cysto     HPI: Christian Pena is a 71 y.o. male with a personal history of bladder cancer and BPH with urinary obstruction, who presents today for 3 month surveillance cystoscopy.     He is s/p TURBT on 07/19/2020 that revealed high-grade noninvasive urothelial carcinoma; 1 cm. There were no other bladder lesion identified and the muscularis propria was appreciated and not involved.    RUS at the time of TURBT was unremarkable.    In March 2022 he completed BCG x6.   He is s/p CABG x3.     Vitals:   07/30/21 0834  BP: (!) 78/49  Pulse: 71  NED. A&Ox3.   No respiratory distress   Abd soft, NT, ND Normal phallus with bilateral descended testicles  Cystoscopy Procedure Note  Patient identification was confirmed, informed consent was obtained, and patient was prepped using Betadine solution.  Lidocaine jelly was administered per urethral meatus.     Pre-Procedure: - Inspection reveals a normal caliber ureteral meatus.  Procedure: The flexible cystoscope was introduced without difficulty - No urethral strictures/lesions are present. - Enlarged prostate  - Elevated bladder neck - Bilateral ureteral orifices identified - Bladder mucosa  reveals tumor <1 cm on right posterior lateral bladder wall - No bladder stones - No trabeculation  Post-Procedure: - Patient tolerated the procedure well  Assessment/ Plan:  History of bladder cancer  - Cystoscopy today revealed <1cm tumor on right bladder wall consistent with a small reoccurence - Will proceed to operating room for TURBT, bilateral retrograde, and intravesical gemcitabine  -Discussed risks and benefits -will likely consider re induction BCG followed by maintenance pending pathology post op - We will need to get clearance from cardiology especially in light of his recent CABG, he can stay on baby aspirin.  - Urine sent for pre-op culture.   Return to OR  for TURBT.  I,Kailey Littlejohn,acting as a Education administrator for Hollice Espy, MD.,have documented all relevant documentation on the behalf of Hollice Espy, MD,as directed by  Hollice Espy, MD while in the presence of Hollice Espy, MD.  I have reviewed the above documentation for accuracy and completeness, and I agree with the above.   Hollice Espy, MD

## 2021-07-30 ENCOUNTER — Encounter: Payer: Self-pay | Admitting: Urology

## 2021-07-30 ENCOUNTER — Telehealth: Payer: Self-pay | Admitting: Urology

## 2021-07-30 ENCOUNTER — Ambulatory Visit: Payer: Medicare HMO | Admitting: Urology

## 2021-07-30 ENCOUNTER — Other Ambulatory Visit: Payer: Self-pay

## 2021-07-30 VITALS — BP 78/49 | HR 71 | Ht 72.0 in | Wt 216.0 lb

## 2021-07-30 DIAGNOSIS — Z8551 Personal history of malignant neoplasm of bladder: Secondary | ICD-10-CM | POA: Diagnosis not present

## 2021-07-30 LAB — URINALYSIS, COMPLETE
Bilirubin, UA: NEGATIVE
Glucose, UA: NEGATIVE
Leukocytes,UA: NEGATIVE
Nitrite, UA: NEGATIVE
RBC, UA: NEGATIVE
Specific Gravity, UA: 1.025 (ref 1.005–1.030)
Urobilinogen, Ur: 1 mg/dL (ref 0.2–1.0)
pH, UA: 5.5 (ref 5.0–7.5)

## 2021-07-30 LAB — MICROSCOPIC EXAMINATION: Bacteria, UA: NONE SEEN

## 2021-07-30 NOTE — Telephone Encounter (Signed)
Surgical Physician Order Form [facility] [place]  * Scheduling expectation : Next Available  *Length of Case:   *Clearance needed: yes; cardiology  *Anticoagulation Instructions: N/A  *Aspirin Instructions: Ok to continue Aspirin  *Post-op visit Date/Instructions:  1-2 week follow up  *Diagnosis: Bladder Tumor  *Procedure: bilateral RTG, TURBT <2cm (15996), intravesical chemotherapy   Additional orders: Gemcitabine 2000mg  bladder instillation  -Admit type: OUTpatient  -Anesthesia: General  -VTE Prophylaxis Standing Order SCDs       Other:   -Standing Lab Orders Per Anesthesia    Lab other: None  -Standing Test orders EKG/Chest x-ray per Anesthesia       Test other:   - Medications:  Ancef 2gm IV  -Other orders:  N/A

## 2021-07-31 DIAGNOSIS — Z951 Presence of aortocoronary bypass graft: Secondary | ICD-10-CM | POA: Diagnosis not present

## 2021-07-31 NOTE — Progress Notes (Signed)
Daily Session Note  Patient Details  Name: BLYTHE HARTSHORN MRN: [id number] Date of Birth: Aug 11, 1950 Referring Provider:   Flowsheet Row Cardiac Rehab from 07/10/2021 in [facility] Cardiac and Pulmonary Rehab  Referring Provider [name]       Encounter Date: 07/31/2021  Check In:  Session Check In - 07/31/21 0741       Check-In   Supervising physician immediately available to respond to emergencies See telemetry face sheet for immediately available ER MD    Location [facility]-Cardiac & Pulmonary Rehab    Staff Present Birdie Sons, MPA, RN;Joseph Tessie Fass, RCP,RRT,BSRT;Amanda [place], [place], ACSM CEP, Exercise Physiologist    Virtual Visit No    Medication changes reported     No    Fall or balance concerns reported    No    Tobacco Cessation No Change    Warm-up and Cool-down Performed on first and last piece of equipment    Resistance Training Performed Yes    VAD Patient? No    PAD/SET Patient? No      Pain Assessment   Currently in Pain? No/denies                Social History   Tobacco Use  Smoking Status Former   Types: Cigarettes  Smokeless Tobacco Never    Goals Met:  Independence with exercise equipment Exercise tolerated well No report of concerns or symptoms today Strength training completed today  Goals Unmet:  Not Applicable  Comments: Pt able to follow exercise prescription today without complaint.  Will continue to monitor for progression.    Dr. Emily Filbert is Medical Director for [place].  Dr. Ottie Glazier is Medical Director for [facility] Pulmonary Rehabilitation.

## 2021-08-01 ENCOUNTER — Other Ambulatory Visit: Payer: Self-pay

## 2021-08-01 DIAGNOSIS — D494 Neoplasm of unspecified behavior of bladder: Secondary | ICD-10-CM

## 2021-08-01 NOTE — Progress Notes (Signed)
°  Perioperative Services Pre-Admission/Anesthesia Testing     Date: 08/01/21  Name: Christian Pena MRN:   [id number]  Re: Request from surgery for clearance prior to scheduled procedure  Patient is scheduled to undergo a TRANSURETHRAL RESECTION OF BLADDER TUMOR (TURBT) WITH INSTILLATION OF GEMCITABINE; CYSTOSCOPY WITH RETROGRADE PYELOGRAM on 08/19/2021 with Dr. Hollice Espy, MD. Patient has not been scheduled for his PAT appointment at this point, thus has not undergone review by PAT RN and/or APP. Received communication from primary attending surgeon's office requesting that patient be submitted for clearance from cardiology.   PROVIDER SPECIALTY FAXED TO   Serafina Royals, MD  Cardiology  [phone]   Plan:  Clearance documents generated and faxed to appropriate provider(s) as noted above. Note will be updated to reflect communication with provider's office as it relates to clearance being provided and/or the need for office visit prior to clearance for surgery being issued.   Honor Loh, MSN, APRN, FNP-C, CEN [facility]  Peri-operative Services Nurse Practitioner Phone: [phone] 08/01/21 11:26 AM  NOTE: This note has been prepared using Dragon dictation software. Despite my best ability to proofread, there is always the potential that unintentional transcriptional errors may still occur from this process.

## 2021-08-01 NOTE — Progress Notes (Signed)
[facility] Posting Form   Surgery Date/Time: Date: 08/19/2021  Surgeon: Dr. Hollice Espy, MD  Surgery Location: Day Surgery  Inpt ( No  )   Outpt (Yes)   Obs ( No  )   Diagnosis: D49.4 Bladder Tumor  -CPT: [phone]  Surgery: Transurethral Resection of Bladder Tumor with instillation of Gemcitabine and bilateral retrograde pyelograms.   Stop Anticoagulations: N/A, may continue ASA  Cardiac/Medical/Pulmonary Clearance needed: Yes  Clearance needed from Dr: [name]  Clearance request sent on: Date: 08/01/21   *Orders entered into EPIC  Date: 08/01/21   *Case booked in EPIC  Date: 08/01/21  *Notified pt of Surgery: Date: 07/31/2021  *Placed into Prior Authorization Work Fabio Bering Date: 08/01/21   Assistant/laser/rep:No

## 2021-08-01 NOTE — Telephone Encounter (Signed)
I spoke with Mr. Christian Pena. We have discussed possible surgery dates and Monday February 6th, 2023 was agreed upon by all parties. Patient given information about surgery date, what to expect pre-operatively and post operatively.   We discussed that a Pre-Admission Testing office will be calling to set up the pre-op visit that will take place prior to surgery, and that these appointments are typically done over the phone with a Pre-Admissions RN.   Informed patient that our office will communicate any additional care to be provided after surgery. Patients questions or concerns were discussed during our call. Advised to call our office should there be any additional information, questions or concerns that arise. Patient verbalized understanding.

## 2021-08-02 ENCOUNTER — Encounter: Payer: Medicare HMO | Admitting: *Deleted

## 2021-08-02 ENCOUNTER — Other Ambulatory Visit: Payer: Self-pay

## 2021-08-02 DIAGNOSIS — Z951 Presence of aortocoronary bypass graft: Secondary | ICD-10-CM

## 2021-08-02 NOTE — Progress Notes (Signed)
Daily Session Note  Patient Details  Name: Christian Pena MRN: [id number] Date of Birth: [birth year] Referring Provider:   Flowsheet Row Cardiac Rehab from 07/10/2021 in [facility] Cardiac and Pulmonary Rehab  Referring Provider [name]       Encounter Date: 08/02/2021  Check In:  Session Check In - 08/02/21 0817       Check-In   Supervising physician immediately available to respond to emergencies See telemetry face sheet for immediately available ER MD    Location [facility]-Cardiac & Pulmonary Rehab    Staff Present Alberteen Sam, MA, RCEP, CCRP, CCET;Joseph [place], [place];Heath Lark, RN, BSN, CCRP    Virtual Visit No    Medication changes reported     No    Fall or balance concerns reported    No    Warm-up and Cool-down Performed on first and last piece of equipment    Resistance Training Performed Yes    VAD Patient? No    PAD/SET Patient? No      Pain Assessment   Currently in Pain? No/denies                Social History   Tobacco Use  Smoking Status Former   Types: Cigarettes  Smokeless Tobacco Never    Goals Met:  Independence with exercise equipment Exercise tolerated well No report of concerns or symptoms today  Goals Unmet:  Not Applicable  Comments: Pt able to follow exercise prescription today without complaint.  Will continue to monitor for progression.    Dr. Emily Filbert is Medical Director for [place].  Dr. Ottie Glazier is Medical Director for [facility] Pulmonary Rehabilitation.

## 2021-08-05 ENCOUNTER — Other Ambulatory Visit: Payer: Self-pay

## 2021-08-05 ENCOUNTER — Encounter: Payer: Medicare HMO | Admitting: *Deleted

## 2021-08-05 DIAGNOSIS — Z951 Presence of aortocoronary bypass graft: Secondary | ICD-10-CM

## 2021-08-05 NOTE — Progress Notes (Signed)
Daily Session Note  Patient Details  Name: Christian Pena MRN: [id number] Date of Birth: 19-Apr-1951 Referring Provider:   Flowsheet Row Cardiac Rehab from 07/10/2021 in [facility] Cardiac and Pulmonary Rehab  Referring Provider [name]       Encounter Date: 08/05/2021  Check In:  Session Check In - 08/05/21 0809       Check-In   Supervising physician immediately available to respond to emergencies See telemetry face sheet for immediately available ER MD    Location [facility]-Cardiac & Pulmonary Rehab    Staff Present Earlean Shawl, BS, ACSM CEP, Exercise Physiologist;Joseph [place], [place];Heath Lark, RN, BSN, CCRP    Virtual Visit No    Medication changes reported     No    Fall or balance concerns reported    No    Tobacco Cessation No Change    Current number of cigarettes/nicotine per day     0    Warm-up and Cool-down Performed on first and last piece of equipment    Resistance Training Performed Yes    VAD Patient? No    PAD/SET Patient? No      Pain Assessment   Currently in Pain? No/denies                Social History   Tobacco Use  Smoking Status Former   Types: Cigarettes  Smokeless Tobacco Never    Goals Met:  Independence with exercise equipment Exercise tolerated well No report of concerns or symptoms today  Goals Unmet:  Not Applicable  Comments: Pt able to follow exercise prescription today without complaint.  Will continue to monitor for progression.    Dr. Emily Filbert is Medical Director for [place].  Dr. Ottie Glazier is Medical Director for [facility] Pulmonary Rehabilitation.

## 2021-08-06 LAB — CULTURE, URINE COMPREHENSIVE

## 2021-08-07 ENCOUNTER — Encounter: Payer: Self-pay | Admitting: *Deleted

## 2021-08-07 ENCOUNTER — Other Ambulatory Visit: Payer: Self-pay

## 2021-08-07 DIAGNOSIS — Z951 Presence of aortocoronary bypass graft: Secondary | ICD-10-CM

## 2021-08-07 NOTE — Progress Notes (Signed)
Daily Session Note  Patient Details  Name: Christian Pena MRN: [id number] Date of Birth: 1950-12-08 Referring Provider:   Flowsheet Row Cardiac Rehab from 07/10/2021 in [facility] Cardiac and Pulmonary Rehab  Referring Provider [name]       Encounter Date: 08/07/2021  Check In:  Session Check In - 08/07/21 0744       Check-In   Supervising physician immediately available to respond to emergencies See telemetry face sheet for immediately available ER MD    Location [facility]-Cardiac & Pulmonary Rehab    Staff Present Birdie Sons, MPA, RN;Jessica [place], MA, RCEP, CCRP, CCET;Joseph [place], [place]    Virtual Visit No    Medication changes reported     No    Fall or balance concerns reported    No    Tobacco Cessation No Change    Warm-up and Cool-down Performed on first and last piece of equipment    Resistance Training Performed Yes    VAD Patient? No    PAD/SET Patient? No      Pain Assessment   Currently in Pain? No/denies                Social History   Tobacco Use  Smoking Status Former   Types: Cigarettes  Smokeless Tobacco Never    Goals Met:  Independence with exercise equipment Exercise tolerated well No report of concerns or symptoms today Strength training completed today  Goals Unmet:  Not Applicable  Comments: Pt able to follow exercise prescription today without complaint.  Will continue to monitor for progression.    Dr. Emily Filbert is Medical Director for [place].  Dr. Ottie Glazier is Medical Director for [facility] Pulmonary Rehabilitation.

## 2021-08-07 NOTE — Progress Notes (Signed)
Cardiac Individual Treatment Plan  Patient Details  Name: Christian Pena MRN: [id number] Date of Birth: 1950-09-12 Referring Provider:   Flowsheet Row Cardiac Rehab from 07/10/2021 in [facility] Cardiac and Pulmonary Rehab  Referring Provider [name]       Initial Encounter Date:  Flowsheet Row Cardiac Rehab from 07/10/2021 in [facility] Cardiac and Pulmonary Rehab  Date 07/10/21       Visit Diagnosis: S/P CABG x 3  Patient's Home Medications on Admission:  Current Outpatient Medications:    aspirin 81 MG EC tablet, Take 81 mg by mouth daily., Disp: , Rfl:    metoprolol succinate (TOPROL-XL) 25 MG 24 hr tablet, Take 12.5 mg by mouth daily., Disp: , Rfl:    Multiple Vitamins-Minerals (MULTIVITAMIN WITH MINERALS) tablet, Take 1 tablet by mouth daily. Centrum silver Adult 50 +, Disp: , Rfl:    omeprazole (PRILOSEC) 20 MG capsule, Take 20 mg by mouth daily., Disp: , Rfl:    rosuvastatin (CRESTOR) 40 MG tablet, Take 40 mg by mouth daily., Disp: , Rfl:    tamsulosin (FLOMAX) 0.4 MG CAPS capsule, Take 1 capsule (0.4 mg total) by mouth daily., Disp: 30 capsule, Rfl: 11  Past Medical History: Past Medical History:  Diagnosis Date   GERD (gastroesophageal reflux disease)    Hypertension     Tobacco Use: Social History   Tobacco Use  Smoking Status Former   Types: Cigarettes  Smokeless Tobacco Never    Labs: Recent Review Flowsheet Data   There is no flowsheet data to display.      Exercise Target Goals: Exercise Program Goal: Individual exercise prescription set using results from initial 6 min walk test and THRR while considering  patients activity barriers and safety.   Exercise Prescription Goal: Initial exercise prescription builds to 30-45 minutes a day of aerobic activity, 2-3 days per week.  Home exercise guidelines will be given to patient during program as part of exercise prescription that the participant will acknowledge.   Education: Aerobic Exercise: - Group  verbal and visual presentation on the components of exercise prescription. Introduces F.I.T.T principle from ACSM for exercise prescriptions.  Reviews F.I.T.T. principles of aerobic exercise including progression. Written material given at graduation.   Education: Resistance Exercise: - Group verbal and visual presentation on the components of exercise prescription. Introduces F.I.T.T principle from ACSM for exercise prescriptions  Reviews F.I.T.T. principles of resistance exercise including progression. Written material given at graduation. Flowsheet Row Cardiac Rehab from 08/07/2021 in [facility] Cardiac and Pulmonary Rehab  Date 07/17/21  Educator [facility]  Instruction Review Code 1- Verbalizes Understanding        Education: Exercise & Equipment Safety: - Individual verbal instruction and demonstration of equipment use and safety with use of the equipment. Flowsheet Row Cardiac Rehab from 08/07/2021 in [facility] Cardiac and Pulmonary Rehab  Date 07/10/21  Educator [facility]  Instruction Review Code 1- Verbalizes Understanding       Education: Exercise Physiology & General Exercise Guidelines: - Group verbal and written instruction with models to review the exercise physiology of the cardiovascular system and associated critical values. Provides general exercise guidelines with specific guidelines to those with heart or lung disease.  Flowsheet Row Cardiac Rehab from 08/07/2021 in [facility] Cardiac and Pulmonary Rehab  Education need identified 07/10/21       Education: Flexibility, Balance, Mind/Body Relaxation: - Group verbal and visual presentation with interactive activity on the components of exercise prescription. Introduces F.I.T.T principle from ACSM for exercise prescriptions. Reviews F.I.T.T. principles of flexibility  and balance exercise training including progression. Also discusses the mind body connection.  Reviews various relaxation techniques to help reduce and manage stress (i.e. Deep breathing,  progressive muscle relaxation, and visualization). Balance handout provided to take home. Written material given at graduation.   Activity Barriers & Risk Stratification:  Activity Barriers & Cardiac Risk Stratification - 06/19/21 1402       Activity Barriers & Cardiac Risk Stratification   Activity Barriers Balance Concerns    Cardiac Risk Stratification High             6 Minute Walk:  6 Minute Walk     Row Name 07/10/21 0915         6 Minute Walk   Phase Initial     Distance 1365 feet     Walk Time 6 minutes     # of Rest Breaks 0     MPH 2.6     METS 2.9     RPE 7     Perceived Dyspnea  0     VO2 Peak 10.13     Symptoms Yes (comment)     Comments little dizziness at beginning that went away     Resting HR 59 bpm     Resting BP 114/60     Resting Oxygen Saturation  100 %     Exercise Oxygen Saturation  during 6 min walk 98 %     Max Ex. HR 90 bpm     Max Ex. BP 118/72     2 Minute Post BP 108/68              Oxygen Initial Assessment:   Oxygen Re-Evaluation:   Oxygen Discharge (Final Oxygen Re-Evaluation):   Initial Exercise Prescription:  Initial Exercise Prescription - 07/10/21 0900       Date of Initial Exercise RX and Referring Provider   Date 07/10/21    Referring Provider [name]      Oxygen   Maintain Oxygen Saturation 88% or higher      Treadmill   MPH 2.3    Grade 0.5    Minutes 15    METs 2.9      Recumbant Bike   Level 3    RPM 60    Minutes 15    METs 2.9      REL-XR   Level 3    Speed 50    Minutes 15    METs 2.9      T5 Nustep   Level 2    SPM 80    Minutes 15    METs 2.9      Prescription Details   Frequency (times per week) 3    Duration Progress to 30 minutes of continuous aerobic without signs/symptoms of physical distress      Intensity   THRR 40-80% of Max Heartrate 96-133    Ratings of Perceived Exertion 11-13    Perceived Dyspnea 0-4      Resistance Training   Training Prescription Yes     Weight 3 lb    Reps 10-15             Perform Capillary Blood Glucose checks as needed.  Exercise Prescription Changes:   Exercise Prescription Changes     Row Name 07/10/21 0900 07/22/21 0800 08/05/21 1700         Response to Exercise   Blood Pressure (Admit) 114/60 -- 118/68     Blood Pressure (Exercise) 118/72 -- 118/84  Blood Pressure (Exit) 108/68 -- 102/64     Heart Rate (Admit) 59 bpm -- 67 bpm     Heart Rate (Exercise) 90 bpm -- 111 bpm     Heart Rate (Exit) 77 bpm -- 81 bpm     Oxygen Saturation (Admit) 100 % -- --     Oxygen Saturation (Exercise) 98 % -- --     Rating of Perceived Exertion (Exercise) 7 -- 13     Perceived Dyspnea (Exercise) 0 -- --     Symptoms little dizzy  dizziness resolved by end of walk --  dizziness resolved by end of walk none     Duration -- -- Progress to 30 minutes of  aerobic without signs/symptoms of physical distress     Intensity -- -- THRR unchanged       Progression   Progression -- -- Continue to progress workloads to maintain intensity without signs/symptoms of physical distress.     Average METs -- -- 3.4       Resistance Training   Training Prescription -- Yes Yes     Weight -- 3 lb 4 lb     Reps -- 10-15 10-15       Interval Training   Interval Training -- -- No       Treadmill   MPH -- 2.3 2.6     Grade -- 0.5 1.5     Minutes -- 15 15     METs -- 2.9 3.53       Recumbant Bike   Level -- 3 --     RPM -- 60 --     Minutes -- 15 --     METs -- 2.9 --       NuStep   Level -- -- 5     Minutes -- -- 15     METs -- -- 3.3       REL-XR   Level -- 3 4     Speed -- 50 --     Minutes -- 15 15     METs -- 2.9 3.43       T5 Nustep   Level -- 2 --     SPM -- 80 --     Minutes -- 15 --     METs -- 2.9 --       Home Exercise Plan   Plans to continue exercise at -- Home (comment)  use RB and walk at home on off days of cardiac rehab. --     Frequency -- Add 2 additional days to program exercise  sessions. --     Initial Home Exercises Provided -- 07/22/21 --       Oxygen   Maintain Oxygen Saturation -- -- 88% or higher              Exercise Comments:   Exercise Comments     Row Name 07/17/21 0754           Exercise Comments First full day of exercise!  Patient was oriented to gym and equipment including functions, settings, policies, and procedures.  Patient's individual exercise prescription and treatment plan were reviewed.  All starting workloads were established based on the results of the 6 minute walk test done at initial orientation visit.  The plan for exercise progression was also introduced and progression will be customized based on patient's performance and goals.                Exercise Goals  and Review:   Exercise Goals     Row Name 07/10/21 0930             Exercise Goals   Increase Physical Activity Yes       Intervention Provide advice, education, support and counseling about physical activity/exercise needs.;Develop an individualized exercise prescription for aerobic and resistive training based on initial evaluation findings, risk stratification, comorbidities and participant's personal goals.       Expected Outcomes Short Term: Attend rehab on a regular basis to increase amount of physical activity.;Long Term: Add in home exercise to make exercise part of routine and to increase amount of physical activity.;Long Term: Exercising regularly at least 3-5 days a week.       Increase Strength and Stamina Yes       Intervention Provide advice, education, support and counseling about physical activity/exercise needs.;Develop an individualized exercise prescription for aerobic and resistive training based on initial evaluation findings, risk stratification, comorbidities and participant's personal goals.       Expected Outcomes Short Term: Increase workloads from initial exercise prescription for resistance, speed, and METs.;Short Term: Perform  resistance training exercises routinely during rehab and add in resistance training at home;Long Term: Improve cardiorespiratory fitness, muscular endurance and strength as measured by increased METs and functional capacity (6MWT)       Able to understand and use rate of perceived exertion (RPE) scale Yes       Intervention Provide education and explanation on how to use RPE scale       Expected Outcomes Short Term: Able to use RPE daily in rehab to express subjective intensity level;Long Term:  Able to use RPE to guide intensity level when exercising independently       Able to understand and use Dyspnea scale Yes       Intervention Provide education and explanation on how to use Dyspnea scale       Expected Outcomes Short Term: Able to use Dyspnea scale daily in rehab to express subjective sense of shortness of breath during exertion;Long Term: Able to use Dyspnea scale to guide intensity level when exercising independently       Knowledge and understanding of Target Heart Rate Range (THRR) Yes       Intervention Provide education and explanation of THRR including how the numbers were predicted and where they are located for reference       Expected Outcomes Short Term: Able to state/look up THRR;Short Term: Able to use daily as guideline for intensity in rehab;Long Term: Able to use THRR to govern intensity when exercising independently       Able to check pulse independently Yes       Intervention Provide education and demonstration on how to check pulse in carotid and radial arteries.;Review the importance of being able to check your own pulse for safety during independent exercise       Expected Outcomes Short Term: Able to explain why pulse checking is important during independent exercise;Long Term: Able to check pulse independently and accurately       Understanding of Exercise Prescription Yes       Intervention Provide education, explanation, and written materials on patient's individual  exercise prescription       Expected Outcomes Short Term: Able to explain program exercise prescription;Long Term: Able to explain home exercise prescription to exercise independently                Exercise Goals Re-Evaluation :  Exercise Goals  Re-Evaluation     Row Name 07/17/21 0755 07/22/21 0814 08/05/21 1721         Exercise Goal Re-Evaluation   Exercise Goals Review Increase Physical Activity;Able to understand and use rate of perceived exertion (RPE) scale;Knowledge and understanding of Target Heart Rate Range (THRR);Understanding of Exercise Prescription;Able to understand and use Dyspnea scale;Increase Strength and Stamina;Able to check pulse independently Increase Physical Activity;Able to understand and use rate of perceived exertion (RPE) scale;Knowledge and understanding of Target Heart Rate Range (THRR);Understanding of Exercise Prescription;Able to understand and use Dyspnea scale;Increase Strength and Stamina;Able to check pulse independently Increase Physical Activity;Increase Strength and Stamina     Comments Reviewed RPE and dyspnea scales, THR and program prescription with pt today.  Pt voiced understanding and was given a copy of goals to take home. Reviewed home exercise with pt today.  Pt plans to use RB at home for exercise.  Reviewed THR, pulse, RPE, sign and symptoms, pulse oximetery and when to call 911 or MD.  Also discussed weather considerations and indoor options.  Pt voiced understanding. Christian Pena is doing well in rehab. He has made significant improvement by increasing to a speed of 2.6/ 1.5% incline as well as level 5 on the T4 Nustep. He also increased to 4 lbs for his handweights. Will continue to monitor.     Expected Outcomes Short: Use RPE daily to regulate intensity. Long: Follow program prescription in THR. Short: start exercising at home 1-2 days per week on off days of class. Long: Become independent with exercise routine. Short: Continue working up loads on  the treadmill Long: Continue to increase overall MET level              Discharge Exercise Prescription (Final Exercise Prescription Changes):  Exercise Prescription Changes - 08/05/21 1700       Response to Exercise   Blood Pressure (Admit) 118/68    Blood Pressure (Exercise) 118/84    Blood Pressure (Exit) 102/64    Heart Rate (Admit) 67 bpm    Heart Rate (Exercise) 111 bpm    Heart Rate (Exit) 81 bpm    Rating of Perceived Exertion (Exercise) 13    Symptoms none    Duration Progress to 30 minutes of  aerobic without signs/symptoms of physical distress    Intensity THRR unchanged      Progression   Progression Continue to progress workloads to maintain intensity without signs/symptoms of physical distress.    Average METs 3.4      Resistance Training   Training Prescription Yes    Weight 4 lb    Reps 10-15      Interval Training   Interval Training No      Treadmill   MPH 2.6    Grade 1.5    Minutes 15    METs 3.53      NuStep   Level 5    Minutes 15    METs 3.3      REL-XR   Level 4    Minutes 15    METs 3.43      Oxygen   Maintain Oxygen Saturation 88% or higher             Nutrition:  Target Goals: Understanding of nutrition guidelines, daily intake of sodium <1524m, cholesterol <2029m calories 30% from fat and 7% or less from saturated fats, daily to have 5 or more servings of fruits and vegetables.  Education: All About Nutrition: -Group instruction provided by verbal, written material,  interactive activities, discussions, models, and posters to present general guidelines for heart healthy nutrition including fat, fiber, MyPlate, the role of sodium in heart healthy nutrition, utilization of the nutrition label, and utilization of this knowledge for meal planning. Follow up email sent as well. Written material given at graduation. Flowsheet Row Cardiac Rehab from 08/07/2021 in [facility] Cardiac and Pulmonary Rehab  Education need identified  07/10/21  Date 08/07/21  Educator [place]  Instruction Review Code 1- Verbalizes Understanding       Biometrics:  Pre Biometrics - 07/10/21 0931       Pre Biometrics   Height 6' (1.829 m)    Weight 216 lb (98 kg)    BMI (Calculated) 29.29    Single Leg Stand 11.25 seconds              Nutrition Therapy Plan and Nutrition Goals:  Nutrition Therapy & Goals - 07/22/21 1558       Nutrition Therapy   Diet Heart healthy, low Na    Drug/Food Interactions Statins/Certain Fruits    Protein (specify units) 80g    Fiber 30 grams    Whole Grain Foods 3 servings    Saturated Fats 12 max. grams    Fruits and Vegetables 8 servings/day    Sodium 2 grams      Personal Nutrition Goals   Nutrition Goal ST: practice reading food labels, limit Na with cooking, eat the rainbow of fruits and vegetables in a week LT: eat 8 fruits/vegetables per day, limit Na < 2g/day, become proficient in label reading    Comments 71 y.o. M admitted to rehab s/p CABG x3. Presenting with HTN, Afib, asthma, HLD. Relevant medications include crestor, MVI. PYP 49. Vegetables & Fruits 7/12. Breads, Grains & Cereals 6/12. Red & Processed Meat 4/12. Poultry 0/2. Fish & Shellfish 0/4. Beans, Nuts & Seeds 2/4. Milk & Dairy Foods 2/6. Toppings, Oils, Seasonings & Salt 13/20. Sweets, Snacks & Restaurant Food 7/14. Beverages 8/10. He and his wife feels like his biggest barrier is exploring. He feels he should eat more vegetables. He enjoys fruit. His wife and him have an airfryer instead of frying foods. B: cereal (special K or cheerios) L: cheese and crackers or banana D: they hamburgers monday, pork chops and green beans or mashed potatoes, spaghetti. He will eat potatoes, broccoli, green beans, green salad, green cabbage, blueberries, strawberries in the summer, tomatoes, oranges (OJ), bell peppers. Discussed heart healthy eating.      Intervention Plan   Intervention Prescribe, educate and counsel regarding individualized  specific dietary modifications aiming towards targeted core components such as weight, hypertension, lipid management, diabetes, heart failure and other comorbidities.;Nutrition handout(s) given to patient.    Expected Outcomes Short Term Goal: Understand basic principles of dietary content, such as calories, fat, sodium, cholesterol and nutrients.;Short Term Goal: A plan has been developed with personal nutrition goals set during dietitian appointment.;Long Term Goal: Adherence to prescribed nutrition plan.             Nutrition Assessments:  MEDIFICTS Score Key: ?70 Need to make dietary changes  40-70 Heart Healthy Diet ? 40 Therapeutic Level Cholesterol Diet  Flowsheet Row Cardiac Rehab from 07/10/2021 in [facility] Cardiac and Pulmonary Rehab  Picture Your Plate Total Score on Admission 49      Picture Your Plate Scores: <70 Unhealthy dietary pattern with much room for improvement. 41-50 Dietary pattern unlikely to meet recommendations for good health and room for improvement. 51-60 More healthful dietary pattern, with  some room for improvement.  >60 Healthy dietary pattern, although there may be some specific behaviors that could be improved.    Nutrition Goals Re-Evaluation:  Nutrition Goals Re-Evaluation     Christian Pena Name 07/22/21 0754             Goals   Comment Patient has not yet met with program dietician for a one on one meeting.       Expected Outcome Short: Meet with program dietician to establish speicific dietary goals. Long: Maintian heart healthy diet.                Nutrition Goals Discharge (Final Nutrition Goals Re-Evaluation):  Nutrition Goals Re-Evaluation - 07/22/21 0754       Goals   Comment Patient has not yet met with program dietician for a one on one meeting.    Expected Outcome Short: Meet with program dietician to establish speicific dietary goals. Long: Maintian heart healthy diet.             Psychosocial: Target Goals: Acknowledge  presence or absence of significant depression and/or stress, maximize coping skills, provide positive support system. Participant is able to verbalize types and ability to use techniques and skills needed for reducing stress and depression.   Education: Stress, Anxiety, and Depression - Group verbal and visual presentation to define topics covered.  Reviews how body is impacted by stress, anxiety, and depression.  Also discusses healthy ways to reduce stress and to treat/manage anxiety and depression.  Written material given at graduation.   Education: Sleep Hygiene -Provides group verbal and written instruction about how sleep can affect your health.  Define sleep hygiene, discuss sleep cycles and impact of sleep habits. Review good sleep hygiene tips.    Initial Review & Psychosocial Screening:  Initial Psych Review & Screening - 06/19/21 1405       Initial Review   Current issues with Current Sleep Concerns      Family Dynamics   Good Support System? Yes   wife     Barriers   Psychosocial barriers to participate in program There are no identifiable barriers or psychosocial needs.;The patient should benefit from training in stress management and relaxation.      Screening Interventions   Interventions Encouraged to exercise;Provide feedback about the scores to participant;To provide support and resources with identified psychosocial needs    Expected Outcomes Short Term goal: Utilizing psychosocial counselor, staff and physician to assist with identification of specific Stressors or current issues interfering with healing process. Setting desired goal for each stressor or current issue identified.;Long Term Goal: Stressors or current issues are controlled or eliminated.;Short Term goal: Identification and review with participant of any Quality of Life or Depression concerns found by scoring the questionnaire.;Long Term goal: The participant improves quality of Life and PHQ9 Scores as seen  by post scores and/or verbalization of changes             Quality of Life Scores:   Quality of Life - 07/10/21 0935       Quality of Life   Select Quality of Life      Quality of Life Scores   Health/Function Pre 23.07 %    Socioeconomic Pre 26.25 %    Psych/Spiritual Pre 25.93 %    Family Pre 28.8 %    GLOBAL Pre 25.19 %            Scores of 19 and below usually indicate a poorer quality of life in  these areas.  A difference of  2-3 points is a clinically meaningful difference.  A difference of 2-3 points in the total score of the Quality of Life Index has been associated with significant improvement in overall quality of life, self-image, physical symptoms, and general health in studies assessing change in quality of life.  PHQ-9: Recent Review Flowsheet Data     Depression screen [facility] 2/9 07/10/2021   Decreased Interest 1   Down, Depressed, Hopeless 0   PHQ - 2 Score 1   Altered sleeping 1   Tired, decreased energy 1   Change in appetite 1   Feeling bad or failure about yourself  0   Trouble concentrating 0   Moving slowly or fidgety/restless 0   Suicidal thoughts 0   PHQ-9 Score 4   Difficult doing work/chores Not difficult at all      Interpretation of Total Score  Total Score Depression Severity:  1-4 = Minimal depression, 5-9 = Mild depression, 10-14 = Moderate depression, 15-19 = Moderately severe depression, 20-27 = Severe depression   Psychosocial Evaluation and Intervention:  Psychosocial Evaluation - 06/19/21 1419       Psychosocial Evaluation & Interventions   Interventions Encouraged to exercise with the program and follow exercise prescription    Comments Christian Pena reports healing well from his CABG x 3. His biggest concern is some nerve damage during surgery that is causing numbness in his left ring and pinky fever. This is causing sleep issues. The MD gave him gabapentin to try but unfortunately that medicine is causing some balance concerns  as well. He is giving it a try as he is tired of the dull ache. His wife is very supportive of him and has been a great help during his recovery. He is still sleeping in the guest bedroom since it is a lower bed and easier to get out of. He is happily retired. He is ready to get back into his normal routine and is ready to get started in the program    Expected Outcomes Short: attend cardiac rehab for education and exercise. Long; Develop and maintain positive self care habits    Continue Psychosocial Services  Follow up required by staff             Psychosocial Re-Evaluation:  Psychosocial Re-Evaluation     White Name 07/22/21 0812             Psychosocial Re-Evaluation   Current issues with Current Sleep Concerns       Comments No new stress, sleep, or mental health concerns reported       Expected Outcomes Short: establish a healthy sleep routine. Long: maintain good mental health habits and stress managment.       Interventions Encouraged to attend Cardiac Rehabilitation for the exercise       Continue Psychosocial Services  Follow up required by staff                Psychosocial Discharge (Final Psychosocial Re-Evaluation):  Psychosocial Re-Evaluation - 07/22/21 0812       Psychosocial Re-Evaluation   Current issues with Current Sleep Concerns    Comments No new stress, sleep, or mental health concerns reported    Expected Outcomes Short: establish a healthy sleep routine. Long: maintain good mental health habits and stress managment.    Interventions Encouraged to attend Cardiac Rehabilitation for the exercise    Continue Psychosocial Services  Follow up required by staff  Vocational Rehabilitation: Provide vocational rehab assistance to qualifying candidates.   Vocational Rehab Evaluation & Intervention:  Vocational Rehab - 06/19/21 1408       Initial Vocational Rehab Evaluation & Intervention   Assessment shows need for Vocational  Rehabilitation No             Education: Education Goals: Education classes will be provided on a variety of topics geared toward better understanding of heart health and risk factor modification. Participant will state understanding/return demonstration of topics presented as noted by education test scores.  Learning Barriers/Preferences:  Learning Barriers/Preferences - 06/19/21 1408       Learning Barriers/Preferences   Learning Barriers None    Learning Preferences None             General Cardiac Education Topics:  AED/CPR: - Group verbal and written instruction with the use of models to demonstrate the basic use of the AED with the basic ABC's of resuscitation.   Anatomy and Cardiac Procedures: - Group verbal and visual presentation and models provide information about basic cardiac anatomy and function. Reviews the testing methods done to diagnose heart disease and the outcomes of the test results. Describes the treatment choices: Medical Management, Angioplasty, or Coronary Bypass Surgery for treating various heart conditions including Myocardial Infarction, Angina, Valve Disease, and Cardiac Arrhythmias.  Written material given at graduation. Flowsheet Row Cardiac Rehab from 08/07/2021 in [facility] Cardiac and Pulmonary Rehab  Date 07/17/21  Educator SB  Instruction Review Code 1- Verbalizes Understanding       Medication Safety: - Group verbal and visual instruction to review commonly prescribed medications for heart and lung disease. Reviews the medication, class of the drug, and side effects. Includes the steps to properly store meds and maintain the prescription regimen.  Written material given at graduation. Flowsheet Row Cardiac Rehab from 08/07/2021 in [facility] Cardiac and Pulmonary Rehab  Date 07/31/21  Educator SB  Instruction Review Code 1- Verbalizes Understanding       Intimacy: - Group verbal instruction through game format to discuss how heart and lung  disease can affect sexual intimacy. Written material given at graduation..   Know Your Numbers and Heart Failure: - Group verbal and visual instruction to discuss disease risk factors for cardiac and pulmonary disease and treatment options.  Reviews associated critical values for Overweight/Obesity, Hypertension, Cholesterol, and Diabetes.  Discusses basics of heart failure: signs/symptoms and treatments.  Introduces Heart Failure Zone chart for action plan for heart failure.  Written material given at graduation.   Infection Prevention: - Provides verbal and written material to individual with discussion of infection control including proper hand washing and proper equipment cleaning during exercise session. Flowsheet Row Cardiac Rehab from 08/07/2021 in [facility] Cardiac and Pulmonary Rehab  Date 07/10/21  Educator AS  Instruction Review Code 1- Verbalizes Understanding       Falls Prevention: - Provides verbal and written material to individual with discussion of falls prevention and safety. Flowsheet Row Cardiac Rehab from 08/07/2021 in [facility] Cardiac and Pulmonary Rehab  Date 07/10/21  Educator AS  Instruction Review Code 1- Verbalizes Understanding       Other: -Provides group and verbal instruction on various topics (see comments)   Knowledge Questionnaire Score:  Knowledge Questionnaire Score - 07/10/21 0933       Knowledge Questionnaire Score   Pre Score 21/26             Core Components/Risk Factors/Patient Goals at Admission:  Personal Goals and Risk Factors  at Admission - 07/10/21 0941       Core Components/Risk Factors/Patient Goals on Admission   Number of packs per day 0   quit 05/29/21 - vaping quit cigarettes 6/22   Intervention Assist the participant in steps to quit. Provide individualized education and counseling about committing to Tobacco Cessation, relapse prevention, and pharmacological support that can be provided by physician.;Comptroller, assist with locating and accessing local/national Quit Smoking programs, and support quit date choice.    Expected Outcomes Short Term: Will quit all tobacco product use, adhering to prevention of relapse plan.;Long Term: Complete abstinence from all tobacco products for at least 12 months from quit date.    Hypertension Yes    Intervention Provide education on lifestyle modifcations including regular physical activity/exercise, weight management, moderate sodium restriction and increased consumption of fresh fruit, vegetables, and low fat dairy, alcohol moderation, and smoking cessation.;Monitor prescription use compliance.    Expected Outcomes Short Term: Continued assessment and intervention until BP is < 140/30m HG in hypertensive participants. < 130/872mHG in hypertensive participants with diabetes, heart failure or chronic kidney disease.;Long Term: Maintenance of blood pressure at goal levels.    Lipids Yes    Intervention Provide education and support for participant on nutrition & aerobic/resistive exercise along with prescribed medications to achieve LDL <7075mHDL >35m70m  Expected Outcomes Short Term: Participant states understanding of desired cholesterol values and is compliant with medications prescribed. Participant is following exercise prescription and nutrition guidelines.;Long Term: Cholesterol controlled with medications as prescribed, with individualized exercise RX and with personalized nutrition plan. Value goals: LDL < 70mg5mL > 40 mg.             Education:Diabetes - Individual verbal and written instruction to review signs/symptoms of diabetes, desired ranges of glucose level fasting, after meals and with exercise. Acknowledge that pre and post exercise glucose checks will be done for 3 sessions at entry of program.   Core Components/Risk Factors/Patient Goals Review:   Goals and Risk Factor Review     Row Name 07/10/21 0941 07/22/21 0806            Core Components/Risk Factors/Patient Goals Review   Personal Goals Review Tobacco Cessation Tobacco Cessation;Lipids;Hypertension      Review Gage Christian Likensrecently quit tobacco use within the last 6 months. Intervention for relapse prevention was provided at the initial medical review. He was encouraged to continue to with tobacco cessation and was provided information on relapse prevention. Patient received information about combination therapy, tobacco cessation classes, quit line, and quit smoking apps in case of a relapse. Patient demonstrated understanding of this material.Staff will continue to provide encouragement and follow up with the patient throughout the program. Patient is taking all medicaitons a prescribed to help control risk factors. He continues to not use tobacco products.      Expected Outcomes Continued cessation Short: maintian tobacco free lifestyle. Long: become independent with exercise and continue to take medications to control cardiac risk factors.               Core Components/Risk Factors/Patient Goals at Discharge (Final Review):   Goals and Risk Factor Review - 07/22/21 0806       Core Components/Risk Factors/Patient Goals Review   Personal Goals Review Tobacco Cessation;Lipids;Hypertension    Review Patient is taking all medicaitons a prescribed to help control risk factors. He continues to not use tobacco products.    Expected Outcomes Short: maintian tobacco free lifestyle.  Long: become independent with exercise and continue to take medications to control cardiac risk factors.             ITP Comments:  ITP Comments     Row Name 06/19/21 1415 07/10/21 0938 07/17/21 0754 07/22/21 1640 08/07/21 0831   ITP Comments Initial telephone orientation completed. Diagnosis can be found in [facility] 11/15. EP orientation scheduled for Wednesday 12/28 at 8am. Completed 6MWT and gym orientation. Initial ITP created and sent for review to Dr. Emily Filbert, Medical Director.  First full day of exercise!  Patient was oriented to gym and equipment including functions, settings, policies, and procedures.  Patient's individual exercise prescription and treatment plan were reviewed.  All starting workloads were established based on the results of the 6 minute walk test done at initial orientation visit.  The plan for exercise progression was also introduced and progression will be customized based on patient's performance and goals. Completed initial RD consultation 30 Day review completed. Medical Director ITP review done, changes made as directed, and signed approval by Medical Director.            Comments:

## 2021-08-09 ENCOUNTER — Encounter: Payer: Medicare HMO | Admitting: *Deleted

## 2021-08-09 ENCOUNTER — Other Ambulatory Visit: Payer: Self-pay

## 2021-08-09 DIAGNOSIS — Z951 Presence of aortocoronary bypass graft: Secondary | ICD-10-CM

## 2021-08-09 NOTE — Progress Notes (Signed)
Daily Session Note  Patient Details  Name: Christian Pena MRN: [id number] Date of Birth: 1950-11-14 Referring Provider:   Flowsheet Row Cardiac Rehab from 07/10/2021 in [facility] Cardiac and Pulmonary Rehab  Referring Provider [name]       Encounter Date: 08/09/2021  Check In:  Session Check In - 08/09/21 1003       Check-In   Supervising physician immediately available to respond to emergencies See telemetry face sheet for immediately available ER MD    Location [facility]-Cardiac & Pulmonary Rehab    Staff Present Hope Budds, RDN, LDN;Joseph Montclair, [place];Heath Lark, RN, BSN, CCRP    Virtual Visit No    Medication changes reported     No    Fall or balance concerns reported    No    Warm-up and Cool-down Performed on first and last piece of equipment    Resistance Training Performed Yes    VAD Patient? No    PAD/SET Patient? No      Pain Assessment   Currently in Pain? No/denies                Social History   Tobacco Use  Smoking Status Former   Types: Cigarettes  Smokeless Tobacco Never    Goals Met:  Independence with exercise equipment Exercise tolerated well No report of concerns or symptoms today  Goals Unmet:  Not Applicable  Comments: Pt able to follow exercise prescription today without complaint.  Will continue to monitor for progression.    Dr. Emily Filbert is Medical Director for [place].  Dr. Ottie Glazier is Medical Director for [facility] Pulmonary Rehabilitation.

## 2021-08-12 ENCOUNTER — Other Ambulatory Visit: Payer: Self-pay

## 2021-08-12 ENCOUNTER — Encounter: Payer: Medicare HMO | Admitting: *Deleted

## 2021-08-12 ENCOUNTER — Other Ambulatory Visit
Admission: RE | Admit: 2021-08-12 | Discharge: 2021-08-12 | Disposition: A | Discharge status: Home / Self Care | Payer: Medicare HMO | Source: Ambulatory Visit | Attending: Urology | Admitting: Urology

## 2021-08-12 DIAGNOSIS — Z951 Presence of aortocoronary bypass graft: Secondary | ICD-10-CM

## 2021-08-12 HISTORY — DX: Atherosclerotic heart disease of native coronary artery without angina pectoris: I25.10

## 2021-08-12 HISTORY — DX: Peripheral vascular disease, unspecified: I73.9

## 2021-08-12 HISTORY — DX: Dyspnea, unspecified: R06.00

## 2021-08-12 NOTE — Patient Instructions (Signed)
Your procedure is scheduled on: Monday August 19, 2021. Report to Day Surgery inside [facility] 2nd floor, stop by administration desk before getting on elevator.  To find out your arrival time please call [phone] between 1PM - 3PM on Friday August 16, 2021.  Remember: Instructions that are not followed completely may result in serious medical risk,  up to and including death, or upon the discretion of your surgeon and anesthesiologist your  surgery may need to be rescheduled.     _X__ 1. Do not eat food or drink fluids after midnight the night before your procedure.                 No chewing gum or hard candies.  __X__2.  On the morning of surgery brush your teeth with toothpaste and water, you                may rinse your mouth with mouthwash if you wish.  Do not swallow any toothpaste or mouthwash.     _X__ 3.  No Alcohol for 24 hours before or after surgery.   _X__ 4.  Do Not Smoke or use e-cigarettes For 24 Hours Prior to Your Surgery.                 Do not use any chewable tobacco products for at least 6 hours prior to                 Surgery.  _X__  5.  Do not use any recreational drugs (marijuana, cocaine, heroin, ecstasy, MDMA or other)                For at least one week prior to your surgery.  Combination of these drugs with anesthesia                May have life threatening results.  ____  6.  Bring all medications with you on the day of surgery if instructed.   __X_ 7.  Notify your doctor if there is any change in your medical condition      (cold, fever, infections).     Do not wear jewelry, make-up, hairpins, clips or nail polish. Do not wear lotions, powders, or perfumes. You may wear deodorant. Do not shave 48 hours prior to surgery. Men may shave face and neck. Do not bring valuables to the hospital.    [facility] is not responsible for any belongings or valuables.  Contacts, dentures or bridgework may not be worn into  surgery. Leave your suitcase in the car. After surgery it may be brought to your room. For patients admitted to the hospital, discharge time is determined by your treatment team.   Patients discharged the day of surgery will not be allowed to drive home.   Make arrangements for someone to be with you for the first 24 hours of your Same Day Discharge.   __X__ Take these medicines the morning of surgery with A SIP OF WATER:    1. metoprolol succinate (TOPROL-XL) 25 MG  2. omeprazole (PRILOSEC) 20 MG  3. rosuvastatin (CRESTOR) 40 MG   4.  5.  6.  ____ Fleet Enema (as directed)   ____ Use CHG Soap (or wipes) as directed  ____ Use Benzoyl Peroxide Gel as instructed  ____ Use inhalers on the day of surgery  ____ Stop metformin 2 days prior to surgery    ____ Take 1/2 of usual insulin dose the night before surgery. No  insulin the morning          of surgery.   ____ Call your PCP, cardiologist, or Pulmonologist if taking Coumadin/Plavix/aspirin and ask when to stop before your surgery.   __X__ One Week prior to surgery- Stop Anti-inflammatories such as Ibuprofen, Aleve, Advil, Motrin, meloxicam (MOBIC), diclofenac, etodolac, ketorolac, Toradol, Daypro, piroxicam, Goody's or BC powders. OK TO USE TYLENOL IF NEEDED   __X__ Do not start any vitamins and or herbal supplements until after surgery.    ____ Bring C-Pap to the hospital.    If you have any questions regarding your pre-procedure instructions,  Please call Pre-admit Testing at [phone]

## 2021-08-12 NOTE — Progress Notes (Signed)
Daily Session Note ° °Patient Details  °Name: Christian Pena °MRN: [id number] °Date of Birth: 08/15/1950 °Referring Provider:   °Flowsheet Row Cardiac Rehab from 07/10/2021 in [facility] Cardiac and Pulmonary Rehab  °Referring Provider Kowalski  ° °  ° ° °Encounter Date: 08/12/2021 ° °Check In: ° Session Check In - 08/12/21 0811   ° °  ° Check-In  ° Supervising physician immediately available to respond to emergencies See telemetry face sheet for immediately available ER MD   ° Location [facility]-Cardiac & Pulmonary Rehab   ° Staff Present Laureen Brown, BS, RRT, CPFT;Kelly Hayes, BS, ACSM CEP, Exercise Physiologist;Joseph Hood, RCP,RRT,BSRT;Susanne Bice, RN, BSN, CCRP   ° Virtual Visit No   ° Medication changes reported     No   ° Fall or balance concerns reported    No   ° Warm-up and Cool-down Performed on first and last piece of equipment   ° Resistance Training Performed Yes   ° VAD Patient? No   ° PAD/SET Patient? No   °  ° Pain Assessment  ° Currently in Pain? No/denies   ° °  °  ° °  ° ° ° ° ° °Social History  ° °Tobacco Use  °Smoking Status Former  ° Types: Cigarettes  °Smokeless Tobacco Never  ° ° °Goals Met:  °Independence with exercise equipment °Exercise tolerated well °No report of concerns or symptoms today ° °Goals Unmet:  °Not Applicable ° °Comments: Pt able to follow exercise prescription today without complaint.  Will continue to monitor for progression. ° ° ° °Dr. Mark Miller is Medical Director for [facility].  °Dr. Fuad Aleskerov is Medical Director for [facility]. °

## 2021-08-13 ENCOUNTER — Encounter: Payer: Self-pay | Admitting: Urology

## 2021-08-13 NOTE — Progress Notes (Addendum)
Perioperative Services  Pre-Admission/Anesthesia Testing Clinical Review  Date: 08/14/21  Patient Demographics:  Name: Christian Pena DOB:   [birth year] MRN:   [id number]  Planned Surgical Procedure(s):    Case: [id number] Date/Time: 08/19/21 1227   Procedures:      TRANSURETHRAL RESECTION OF BLADDER TUMOR (TURBT) WITH INSTILLATION OF GEMCITABINE     CYSTOSCOPY WITH RETROGRADE PYELOGRAM (Bilateral)   Anesthesia type: General   Pre-op diagnosis: Bladder Tumor   Location: [place] OR ROOM 10 / [facility]   Surgeons: Hollice Espy, MD   NOTE: Available PAT nursing documentation and vital signs have been reviewed. Clinical nursing staff has updated patient's PMH/PSHx, current medication list, and drug allergies/intolerances to ensure comprehensive history available to assist in medical decision making as it pertains to the aforementioned surgical procedure and anticipated anesthetic course. Extensive review of available clinical information performed. Christian Pena PMH and PSHx updated with any diagnoses/procedures that  may have been inadvertently omitted during his intake with the pre-admission testing department's nursing staff.  Clinical Discussion:  Christian Pena is a 71 y.o. male who is submitted for pre-surgical anesthesia review and clearance prior to him undergoing the above procedure. Patient is a Former Smoker (quit 05/2021). Pertinent PMH includes: CAD (s/p CABG), AAA, ischemic cardiomyopathy, PVD, aortic atherosclerosis, HTN, HLD, DOE, asthma, GERD (on daily PPI), OA, urothelial carcinoma, ETOH use.   Patient is followed by cardiology [name], MD). He was last seen in the cardiology clinic on 08/13/2021; notes reviewed.  At the time of his clinic visit, patient doing well from a cardiovascular perspective.  He denied any episodes of chest pain, however was experiencing shortness of breath.  He denied any PND, orthopnea, palpitations, significant peripheral edema,  vertiginous symptoms, or presyncope/syncope.  Past medical history significant for cardiovascular diagnoses.  AAA duplex performed on 08/18/2019 revealing aneurysmal dilatation of the abdominal aorta measuring 3.1 cm.  TTE performed on 04/22/2021 revealed mild segmental left ventricular systolic dysfunction with an EF of 40%.  There was trivial pulmonary and mild mitral/tricuspid valve regurgitation.  There is no evidence of valvular stenosis.  Diastolic Doppler parameters consistent with abnormal relaxation (G1DD).  There was apical anterior, anteroseptal, inferior, and inferoseptal wall akinesis/scar.  Infrarenal abdominal aortic aneurysm with maximum diameter of 3.46 x 3.70 cm.  Myocardial perfusion imaging study performed on 04/22/2021 revealed a significantly reduced LVEF of 35%.  There was akinesis of the apical and distal anterior myocardium.  SPECT images revealed a large fixed apical myocardial perfusion defect consistent with previous infarct and/or scar.  Additionally, there was a moderate reversible anterior and septal myocardial perfusion defect consistent with myocardial ischemia.  Patient underwent diagnostic heart catheterization on 05/08/2021.  Mildly reduced left ventricular systolic function with 44-03 percent.  There was multivessel CAD with percent second marginal, 35% distal LCx, 40% mid LM, 30% ramus intermedius, 100% proximal LAD, and 100% mid LAD.  There was apical akinesis of the left ventricle and hypokinesis of the distal anterior wall.  Patient was referred to CVTS for further evaluation and consideration of CABG procedure.  Patient underwent a three-vessel CABG procedure on 05/29/2021.  LIMA-LAD and sequenced SVG-LPL-RI bypass grafts were placed.  Repeat TTE performed on 08/08/2021 revealed moderate left ventricular systolic dysfunction with mild LVH; LVEF 35%.  Left atrium mildly enlarged.  There was trivial AR/PR, mild MR, and moderate TR.  There was no evidence of  valvular stenosis.  Large left pleural effusion noted.  Blood pressure reasonably controlled at 132/86 on  currently prescribed beta-blocker monotherapy.  Patient is on a statin for his HLD.  He is not diabetic. Functional capacity, as defined by DASI, is documented as being >/= 4 METS.  Patient recovering well from CABG performed back in 05/2021.  No changes were made to his medication regimen.  Patient to follow-up with outpatient cardiology in 2 months or sooner if needed.  Christian Pena is scheduled for an TRANSURETHRAL RESECTION OF BLADDER TUMOR (TURBT) WITH INSTILLATION OF GEMCITABINE CYSTOSCOPY WITH RETROGRADE PYELOGRAM on 08/19/2021 with Dr. Hollice Espy, MD. Given patient's past medical history significant for cardiovascular diagnoses, presurgical cardiac clearance was sought by the PAT team. "The patient is at the lowest risk possible for perioperative cardiovascular complications with the planned procedure.  The overall risk his procedure is low (<1%).  Currently has no evidence active and/or significant angina and/or congestive heart failure. Patient may proceed to surgery without restriction or need for further cardiovascular testing and an overall LOW risk". This patient is on daily antiplatelet therapy. He has been instructed on recommendations for holding his daily low-dose ASA for 3 days prior to his procedure with plans to restart as soon as postoperative bleeding risk felt to be minimized by his attending surgeon. The patient has been instructed that his last dose of his anticoagulant will be on 08/15/2021.  Patient denies previous perioperative complications with anesthesia in the past. In review of the available records, it is noted that patient underwent a general anesthetic course here (ASA II) in 07/2020 without documented complications.   Vitals with BMI 08/12/2021 07/30/2021 07/10/2021  Height 6' 0"  6' 0"  6' 0"   Weight 212 lbs 216 lbs 216 lbs  BMI 28.75 55.97 41.63  Systolic -  78 -  Diastolic - 49 -  Pulse - 71 -    Providers/Specialists:   NOTE: Primary physician provider listed below. Patient may have been seen by APP or partner within same practice.   PROVIDER ROLE / SPECIALTY LAST Lu Duffel, MD Urology (Surgeon) 07/30/2021  Gauger, Victoriano Lain, NP Primary Care Provider 07/02/2021  Serafina Royals, MD Cardiology 08/13/2021   Allergies:  Patient has no known allergies.  Current Home Medications:   No current facility-administered medications for this encounter.    aspirin 81 MG EC tablet   metoprolol succinate (TOPROL-XL) 25 MG 24 hr tablet   Multiple Vitamins-Minerals (MULTIVITAMIN WITH MINERALS) tablet   omeprazole (PRILOSEC) 20 MG capsule   rosuvastatin (CRESTOR) 40 MG tablet   tamsulosin (FLOMAX) 0.4 MG CAPS capsule   History:   Past Medical History:  Diagnosis Date   AAA (abdominal aortic aneurysm) 08/18/2019   a.) AAA duplex 08/18/2019 --> measured 3.1 cm. b.) TTE 04/22/2021: interval increase in size to 3.46 cm x 3.70 cm.   Aortic atherosclerosis ([place])    Coronary artery disease 05/08/2021   a.) LHC 05/08/2021: EF 45-50%; 55% pRCA, 95% RV branch, 30% dRCA, 100% OM2, 35% dLCx, 40% mLM, 30% RI, 100% pLAD, 100% mLAD; consult CVTS. b.) 3v CABG 05/29/2021: LIMA-LAD, SVG-LPL-RI (sequenced)   Dyspnea on exertion    Erectile dysfunction    GERD (gastroesophageal reflux disease)    History of 2019 novel coronavirus disease (COVID-19) 03/2021   History of ETOH abuse    History of tobacco abuse    HLD (hyperlipidemia)    Hypertension    Ischemic cardiomyopathy    a.) TTE 04/22/2021: mild LV dysfunction with EF 40%; apical anterior, anteroseptal, inferior, and inferoseptal akinesis; triv PR, mild MR/TR; G1DD. b.) TTE  08/08/2021: moderate LV dysfunction with mild LVH; EF 35%; mild LA enlargement, triv AR/PR, mild MR, mod TR.   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   Vitamin D deficiency    Past Surgical History:  Procedure Laterality Date   CORONARY ARTERY BYPASS GRAFT N/A 05/29/2021   Procedure: 3v CORONARTY ARTERY BYPASS GRAFT (LIMA-LAD, SVG-LPL-RI); Location: [facility]; Surgeon: Sharmon Leyden, MD   CYSTOSCOPY W/ RETROGRADES Bilateral 07/19/2020   Procedure: CYSTOSCOPY WITH RETROGRADE PYELOGRAM;  Surgeon: Hollice Espy, MD;  Location: [facility];  Service: Urology;  Laterality: Bilateral;   LEFT HEART CATH AND CORONARY ANGIOGRAPHY Left 05/08/2021   Procedure: LEFT HEART CATH AND CORONARY ANGIOGRAPHY;  Surgeon: Corey Skains, MD;  Location: [facility];  Service: Cardiovascular;  Laterality: Left;   TONSILLECTOMY     TRANSURETHRAL RESECTION OF BLADDER TUMOR WITH MITOMYCIN-C N/A 07/19/2020   Procedure: TRANSURETHRAL RESECTION OF BLADDER TUMOR WITH Gemcitabine;  Surgeon: Hollice Espy, MD;  Location: [facility];  Service: Urology;  Laterality: N/A;   No family history on file. Social History   Tobacco Use   Smoking status: Former    Types: Cigarettes    Quit date: 05/2021    Years since quitting: 0.2   Smokeless tobacco: Never  Vaping Use   Vaping Use: Never used  Substance Use Topics   Alcohol use: Yes    Alcohol/week: 4.0 - 5.0 standard drinks    Types: 4 - 5 Cans of beer per week    Comment: per week   Drug use: Never    Pertinent Clinical Results:  LABS: Labs reviewed: Acceptable for surgery.  Procedure visit on 07/30/2021  Component Date Value Ref Range Status   Specific Gravity, UA 07/30/2021 1.025  1.005 - 1.030 Final   pH, UA 07/30/2021 5.5  5.0 - 7.5 Final   Color, UA 07/30/2021 Orange  Yellow Final   Appearance Ur 07/30/2021 Clear  Clear Final   Leukocytes,UA  07/30/2021 Negative  Negative Final   Protein,UA 07/30/2021 Trace (A)  Negative/Trace Final   Glucose, UA 07/30/2021 Negative  Negative Final   Ketones, UA 07/30/2021 Trace (A)  Negative Final   RBC, UA 07/30/2021 Negative  Negative Final   Bilirubin, UA 07/30/2021 Negative  Negative Final   Urobilinogen, Ur 07/30/2021 1.0  0.2 - 1.0 mg/dL Final   Nitrite, UA 07/30/2021 Negative  Negative Final   Microscopic Examination 07/30/2021 See below:   Final   Urine Culture, Comprehensive 07/30/2021 Final report   Final   Organism ID, Bacteria 07/30/2021 Comment   Final   Comment: Mixed urogenital flora 500  Colonies/mL    WBC, UA 07/30/2021 0-5  0 - 5 /hpf Final   RBC 07/30/2021 0-2  0 - 2 /hpf Final   Epithelial Cells (non renal) 07/30/2021 0-10  0 - 10 /hpf Final   Casts 07/30/2021 Present (A)  None seen /lpf Final   Cast Type 07/30/2021 Hyaline casts  N/A Final   Bacteria, UA 07/30/2021 None seen  None seen/Few Final    Ref Range & Units 06/17/2021  WBC (White Blood Cell Count) 3.2 - 9.8 x109/L 5.6   Hemoglobin 13.7 - 17.3 g/dL 11.5 Low  Hematocrit 39.0 - 49.0 % 36.0 Low    Platelets 150 - 450 x109/L 275   MCV (Mean Corpuscular Volume) 80 - 98 fL 98   MCH (Mean Corpuscular Hemoglobin) 26.5 - 34.0 pg 31.3   MCHC (Mean Corpuscular Hemoglobin Concentration) 31.5 - 36.3 % 31.9   RBC (Red Blood Cell Count) 4.37 - 5.74 x1012/L 3.68 Low    RDW-CV (Red Cell Distribution Width) 11.5 - 14.5 % 13.9   NRBC (Nucleated Red Blood Cell Count) 0 x109/L 0.00   NRBC % (Nucleated Red Blood Cell %) % 0.0   MPV (Mean Platelet Volume) 7.2 - 11.7 fL 8.9   Resulting Agency  [facility]  Specimen Collected: 06/17/21 11:46 Last Resulted: 06/17/21 11:56  Received From: [place]  Result Received: 06/19/21 13:12    Ref Range & Units 06/17/2021  Sodium 135 - 145 mmol/L 137   Potassium 3.5 - 5.0 mmol/L 4.2   Chloride 98 - 108 mmol/L 106   Carbon Dioxide  (CO2) 21 - 30 mmol/L 27   Urea Nitrogen (BUN) 7 - 20 mg/dL 18   Creatinine 0.6 - 1.3 mg/dL 1.1   Glucose 70 - 140 mg/dL 67 Low    Calcium 8.7 - 10.2 mg/dL 8.8   Anion Gap 3 - 12 mmol/L 4   BUN/CREA Ratio 6 - 27 16   Glomerular Filtration Rate (eGFR)  mL/min/1.73sq m 73   Resulting Agency  [facility]  Specimen Collected: 06/17/21 11:46 Last Resulted: 06/17/21 12:43  Received From: [facility]  Result Received: 06/19/21 13:12    ECG: Date: 06/17/2021 Rate: 62 bpm Rhythm: normal sinus; IRBBB Axis (leads I and aVF): Normal Intervals: PR 158 ms. QRS 112 ms. QTc 436 ms. ST segment and T wave changes: Nonspecific inferolateral T wave abnormalities; anterior T wave abnormality.  Comparison: Similar to previous tracing obtained on 05/29/2021 NOTE: Tracing obtained at [facility]; unable for review. Above based on cardiologist's interpretation.    IMAGING / PROCEDURES: TRANSTHORACIC ECHOCARDIOGRAM performed on 08/08/2021 LVEF 35% Moderate left ventricular systolic dysfunction with mild LVH Normal right ventricular systolic function Trivial AR and PR  Mild MR Moderate TR No valvular stenosis Large LEFT pleural effusion noted  CORONARY ARTERY BYPASS GRAFTING performed on 05/29/2021 Three-vessel CABG procedure LIMA-LAD Sequenced SVG-LPL-RI  LEFT HEART CATHETERIZATION AND CORONARY ANGIOGRAPHY performed on 05/08/2021 LVEF 45-50%  Mild left ventricular systolic dysfunction  LVEDP mildly elevated  Multivessel CAD 55% proximal RCA  95% RV branch 30% distal RCA 100% second marginal 35% distal LCx 40% mid LM 30% ramus intermedius 100% proximal LAD 100% mid LAD Recommendations: No further cardiac intervention at this time due to occluded LAD with good collateralization and previously completed myocardial infarction High intensity cholesterol therapy Beta-blocker ACE inhibitor for LV systolic dysfunction Isosorbide for collateral flow to  LAD Further intervention considered if patient has significant symptoms not relieved by above    MYOCARDIAL PERFUSION IMAGING STUDY (LEXISCAN) performed on 04/22/2021 Normal treadmill EKG without evidence of ischemia or arrhythmia  Moderate segmental LV systolic dysfunction with ejection fraction of 35%  Large fixed apical myocardial perfusion defect consistent with previous infarct and/or scar  Moderate reversible anterior and septal myocardial perfusion defect consistent with myocardial ischemia   Impression and Plan:  Christian Pena has been referred for pre-anesthesia review and clearance prior to him undergoing the planned anesthetic and procedural courses. Available labs, pertinent testing, and imaging results were personally reviewed by me. This patient  has been appropriately cleared by cardiology with an overall LOW risk of significant perioperative cardiovascular complications.  Based on clinical review performed today (08/14/21), barring any significant acute changes in the patient's overall condition, it is anticipated that he will be able to proceed with the planned surgical intervention. Any acute changes in clinical condition may necessitate his procedure being postponed and/or cancelled. Patient will meet with anesthesia team (MD and/or CRNA) on the day of his procedure for preoperative evaluation/assessment. Questions regarding anesthetic course will be fielded at that time.   Pre-surgical instructions were reviewed with the patient during his PAT appointment and questions were fielded by PAT clinical staff. Patient was advised that if any questions or concerns arise prior to his procedure then he should return a call to PAT and/or his surgeon's office to discuss.  Honor Loh, MSN, APRN, FNP-C, CEN [facility]  Peri-operative Services Nurse Practitioner Phone: [phone] Fax: [phone] 08/14/21 8:03 AM  NOTE: This note has been prepared using Dragon  dictation software. Despite my best ability to proofread, there is always the potential that unintentional transcriptional errors may still occur from this process.

## 2021-08-14 ENCOUNTER — Other Ambulatory Visit: Payer: Self-pay

## 2021-08-14 ENCOUNTER — Encounter: Payer: Self-pay | Admitting: Urology

## 2021-08-14 ENCOUNTER — Encounter: Payer: Medicare HMO | Attending: Internal Medicine

## 2021-08-14 DIAGNOSIS — Z951 Presence of aortocoronary bypass graft: Secondary | ICD-10-CM | POA: Diagnosis present

## 2021-08-14 NOTE — Progress Notes (Signed)
Daily Session Note ° °Patient Details  °Name: Christian Pena °MRN: [id number] °Date of Birth: 10/06/1950 °Referring Provider:   °Flowsheet Row Cardiac Rehab from 07/10/2021 in [facility]  °Referring Provider Kowalski  ° °  ° ° °Encounter Date: 08/14/2021 ° °Check In: ° Session Check In - 08/14/21 0748   ° °  ° Check-In  ° Supervising physician immediately available to respond to emergencies See telemetry face sheet for immediately available ER MD   ° Location [facility]   ° Staff Present Kelly Bollinger, MPA, RN;Joseph Hood, RCP,RRT,BSRT;Amanda Sommer, BA, ACSM CEP, Exercise Physiologist   ° Virtual Visit No   ° Medication changes reported     No   ° Fall or balance concerns reported    No   ° Tobacco Cessation No Change   ° Warm-up and Cool-down Performed on first and last piece of equipment   ° Resistance Training Performed Yes   ° VAD Patient? No   ° PAD/SET Patient? No   °  ° Pain Assessment  ° Currently in Pain? No/denies   ° °  °  ° °  ° ° ° ° ° °Social History  ° °Tobacco Use  °Smoking Status Former  ° Types: Cigarettes  ° Quit date: 05/2021  ° Years since quitting: 0.2  °Smokeless Tobacco Never  ° ° °Goals Met:  °Independence with exercise equipment °Exercise tolerated well °No report of concerns or symptoms today °Strength training completed today ° °Goals Unmet:  °Not Applicable ° °Comments: Pt able to follow exercise prescription today without complaint.  Will continue to monitor for progression. ° ° ° °Dr. Mark Miller is Medical Director for [facility].  °Dr. Fuad Aleskerov is Medical Director for [facility]. °

## 2021-08-14 NOTE — Progress Notes (Signed)
°  Perioperative Services Pre-Admission/Anesthesia Testing     Date: 08/14/21  Name: Christian Pena MRN:   [id number]  Re: Surgical clearance  Surgical clearance received from Dr. Derrick Ravel office (cardiology). Patient has been cleared for the planned urological procedure scheduled for 08/19/2020 with Dr. Hollice Espy, MD.  Cardiology notes that patient may proceed with an overall LOW risk stratification. He has been cleared to hold his daily low dose ASA for 3 days prior to his procedure. Copy of signed clearance form placed on patient's OR chart for review by the surgical/anesthetic team on the day of his procedure.   Honor Loh, MSN, APRN, FNP-C, CEN [facility]  Peri-operative Services Nurse Practitioner Phone: [phone] 08/14/21 07:55 AM

## 2021-08-16 ENCOUNTER — Other Ambulatory Visit: Payer: Self-pay

## 2021-08-16 ENCOUNTER — Encounter: Payer: Medicare HMO | Admitting: *Deleted

## 2021-08-16 DIAGNOSIS — Z951 Presence of aortocoronary bypass graft: Secondary | ICD-10-CM | POA: Diagnosis not present

## 2021-08-16 NOTE — Progress Notes (Signed)
Daily Session Note  Patient Details  Name: Christian Pena MRN: [id number] Date of Birth: [birth year] Referring Provider:   Flowsheet Row Cardiac Rehab from 07/10/2021 in [facility]  Referring Provider [name]       Encounter Date: 08/16/2021  Check In:  Session Check In - 08/16/21 0805       Check-In   Supervising physician immediately available to respond to emergencies See telemetry face sheet for immediately available ER MD    Location [facility]    Staff Present Alberteen Sam, MA, RCEP, CCRP, CCET;Joseph Dundee, [place];Heath Lark, RN, BSN, CCRP    Virtual Visit No    Medication changes reported     No    Fall or balance concerns reported    No    Warm-up and Cool-down Performed on first and last piece of equipment    Resistance Training Performed Yes    VAD Patient? No    PAD/SET Patient? No      Pain Assessment   Currently in Pain? No/denies                Social History   Tobacco Use  Smoking Status Former   Types: Cigarettes   Quit date: 05/2021   Years since quitting: 0.2  Smokeless Tobacco Never    Goals Met:  Independence with exercise equipment Exercise tolerated well No report of concerns or symptoms today  Goals Unmet:  Not Applicable  Comments: Pt able to follow exercise prescription today without complaint.  Will continue to monitor for progression.    Dr. Emily Filbert is Medical Director for [place].  Dr. Ottie Glazier is Medical Director for [facility].

## 2021-08-19 ENCOUNTER — Encounter: Payer: Self-pay | Admitting: Urology

## 2021-08-19 ENCOUNTER — Ambulatory Visit: Payer: Medicare HMO | Admitting: Urgent Care

## 2021-08-19 ENCOUNTER — Encounter
Admission: RE | Disposition: A | Discharge status: Home / Self Care | Payer: Self-pay | Source: Home / Self Care | Attending: Urology

## 2021-08-19 ENCOUNTER — Other Ambulatory Visit: Payer: Self-pay

## 2021-08-19 ENCOUNTER — Ambulatory Visit
Admission: RE | Admit: 2021-08-19 | Discharge: 2021-08-19 | Disposition: A | Discharge status: Home / Self Care | Payer: Medicare HMO | Attending: Urology | Admitting: Urology

## 2021-08-19 ENCOUNTER — Ambulatory Visit: Payer: Medicare HMO

## 2021-08-19 DIAGNOSIS — M1990 Unspecified osteoarthritis, unspecified site: Secondary | ICD-10-CM | POA: Diagnosis not present

## 2021-08-19 DIAGNOSIS — D414 Neoplasm of uncertain behavior of bladder: Secondary | ICD-10-CM | POA: Diagnosis not present

## 2021-08-19 DIAGNOSIS — I10 Essential (primary) hypertension: Secondary | ICD-10-CM | POA: Diagnosis not present

## 2021-08-19 DIAGNOSIS — Z87891 Personal history of nicotine dependence: Secondary | ICD-10-CM | POA: Diagnosis not present

## 2021-08-19 DIAGNOSIS — Z951 Presence of aortocoronary bypass graft: Secondary | ICD-10-CM | POA: Insufficient documentation

## 2021-08-19 DIAGNOSIS — E785 Hyperlipidemia, unspecified: Secondary | ICD-10-CM | POA: Diagnosis not present

## 2021-08-19 DIAGNOSIS — K219 Gastro-esophageal reflux disease without esophagitis: Secondary | ICD-10-CM | POA: Diagnosis not present

## 2021-08-19 DIAGNOSIS — I739 Peripheral vascular disease, unspecified: Secondary | ICD-10-CM | POA: Insufficient documentation

## 2021-08-19 DIAGNOSIS — D494 Neoplasm of unspecified behavior of bladder: Secondary | ICD-10-CM | POA: Diagnosis not present

## 2021-08-19 DIAGNOSIS — I714 Abdominal aortic aneurysm, without rupture, unspecified: Secondary | ICD-10-CM | POA: Diagnosis not present

## 2021-08-19 DIAGNOSIS — N401 Enlarged prostate with lower urinary tract symptoms: Secondary | ICD-10-CM | POA: Diagnosis not present

## 2021-08-19 DIAGNOSIS — I255 Ischemic cardiomyopathy: Secondary | ICD-10-CM | POA: Insufficient documentation

## 2021-08-19 DIAGNOSIS — I2510 Atherosclerotic heart disease of native coronary artery without angina pectoris: Secondary | ICD-10-CM | POA: Diagnosis not present

## 2021-08-19 DIAGNOSIS — J4520 Mild intermittent asthma, uncomplicated: Secondary | ICD-10-CM | POA: Insufficient documentation

## 2021-08-19 DIAGNOSIS — Z7982 Long term (current) use of aspirin: Secondary | ICD-10-CM | POA: Diagnosis not present

## 2021-08-19 DIAGNOSIS — N138 Other obstructive and reflux uropathy: Secondary | ICD-10-CM | POA: Diagnosis not present

## 2021-08-19 DIAGNOSIS — I700 Atherosclerosis of aorta: Secondary | ICD-10-CM | POA: Insufficient documentation

## 2021-08-19 DIAGNOSIS — Z8551 Personal history of malignant neoplasm of bladder: Secondary | ICD-10-CM

## 2021-08-19 HISTORY — DX: Alcohol abuse, in remission: F10.11

## 2021-08-19 HISTORY — PX: CYSTOSCOPY W/ RETROGRADES: SHX1426

## 2021-08-19 HISTORY — DX: Vitamin D deficiency, unspecified: E55.9

## 2021-08-19 HISTORY — DX: Mild intermittent asthma, uncomplicated: J45.20

## 2021-08-19 HISTORY — DX: Other forms of dyspnea: R06.09

## 2021-08-19 HISTORY — DX: Hyperlipidemia, unspecified: E78.5

## 2021-08-19 HISTORY — DX: Unspecified osteoarthritis, unspecified site: M19.90

## 2021-08-19 HISTORY — PX: TRANSURETHRAL RESECTION OF BLADDER TUMOR: SHX2575

## 2021-08-19 HISTORY — DX: Male erectile dysfunction, unspecified: N52.9

## 2021-08-19 HISTORY — DX: Atherosclerosis of aorta: I70.0

## 2021-08-19 HISTORY — DX: Ischemic cardiomyopathy: I25.5

## 2021-08-19 HISTORY — DX: Personal history of nicotine dependence: Z87.891

## 2021-08-19 SURGERY — TURBT (TRANSURETHRAL RESECTION OF BLADDER TUMOR)
Anesthesia: General

## 2021-08-19 MED ORDER — MIDAZOLAM HCL 2 MG/2ML IJ SOLN
INTRAMUSCULAR | Status: DC | PRN
  Administered 2021-08-19: 1 mg via INTRAVENOUS

## 2021-08-19 MED ORDER — CEFAZOLIN SODIUM-DEXTROSE 2-4 GM/100ML-% IV SOLN
2.0000 g | INTRAVENOUS | Status: AC
  Administered 2021-08-19: 2 g via INTRAVENOUS

## 2021-08-19 MED ORDER — EPHEDRINE 5 MG/ML INJ
INTRAVENOUS | Status: AC
  Filled 2021-08-19: qty 5

## 2021-08-19 MED ORDER — PHENYLEPHRINE 40 MCG/ML (10ML) SYRINGE FOR IV PUSH (FOR BLOOD PRESSURE SUPPORT)
PREFILLED_SYRINGE | INTRAVENOUS | Status: DC | PRN
  Administered 2021-08-19 (×2): 160 ug via INTRAVENOUS
  Administered 2021-08-19 (×2): 80 ug via INTRAVENOUS

## 2021-08-19 MED ORDER — PROPOFOL 10 MG/ML IV BOLUS
INTRAVENOUS | Status: AC
  Filled 2021-08-19: qty 20

## 2021-08-19 MED ORDER — FENTANYL CITRATE (PF) 100 MCG/2ML IJ SOLN
INTRAMUSCULAR | Status: DC | PRN
Start: 2021-08-19 — End: 2021-08-19
  Administered 2021-08-19: 50 ug via INTRAVENOUS

## 2021-08-19 MED ORDER — LIDOCAINE HCL (PF) 2 % IJ SOLN
INTRAMUSCULAR | Status: AC
  Filled 2021-08-19: qty 5

## 2021-08-19 MED ORDER — OXYCODONE HCL 5 MG/5ML PO SOLN: 5.0000 mg | Freq: Once | ORAL | Status: DC | PRN

## 2021-08-19 MED ORDER — MIDAZOLAM HCL 2 MG/2ML IJ SOLN
INTRAMUSCULAR | Status: AC
  Filled 2021-08-19: qty 2

## 2021-08-19 MED ORDER — GEMCITABINE CHEMO FOR BLADDER INSTILLATION 2000 MG
INTRAVENOUS | Status: DC | PRN
  Administered 2021-08-19: 2000 mg via INTRAVESICAL

## 2021-08-19 MED ORDER — ORAL CARE MOUTH RINSE: 15.0000 mL | Freq: Once | OROMUCOSAL | Status: AC

## 2021-08-19 MED ORDER — FENTANYL CITRATE (PF) 100 MCG/2ML IJ SOLN
INTRAMUSCULAR | Status: AC
  Filled 2021-08-19: qty 2

## 2021-08-19 MED ORDER — OXYCODONE HCL 5 MG PO TABS: 5.0000 mg | ORAL_TABLET | Freq: Once | ORAL | Status: DC | PRN

## 2021-08-19 MED ORDER — CEFAZOLIN SODIUM-DEXTROSE 2-4 GM/100ML-% IV SOLN
INTRAVENOUS | Status: AC
  Filled 2021-08-19: qty 100

## 2021-08-19 MED ORDER — PROPOFOL 10 MG/ML IV BOLUS
INTRAVENOUS | Status: DC | PRN
Start: 2021-08-19 — End: 2021-08-19
  Administered 2021-08-19: 130 mg via INTRAVENOUS

## 2021-08-19 MED ORDER — ACETAMINOPHEN 10 MG/ML IV SOLN
INTRAVENOUS | Status: AC
  Filled 2021-08-19: qty 100

## 2021-08-19 MED ORDER — ACETAMINOPHEN 10 MG/ML IV SOLN
INTRAVENOUS | Status: DC | PRN
Start: 2021-08-19 — End: 2021-08-19
  Administered 2021-08-19: 1000 mg via INTRAVENOUS

## 2021-08-19 MED ORDER — DEXAMETHASONE SODIUM PHOSPHATE 10 MG/ML IJ SOLN
INTRAMUSCULAR | Status: DC | PRN
Start: 2021-08-19 — End: 2021-08-19
  Administered 2021-08-19: 5 mg via INTRAVENOUS

## 2021-08-19 MED ORDER — LIDOCAINE HCL (CARDIAC) PF 100 MG/5ML IV SOSY
PREFILLED_SYRINGE | INTRAVENOUS | Status: DC | PRN
  Administered 2021-08-19: 100 mg via INTRAVENOUS

## 2021-08-19 MED ORDER — CHLORHEXIDINE GLUCONATE 0.12 % MT SOLN: 15.0000 mL | Freq: Once | OROMUCOSAL | Status: AC

## 2021-08-19 MED ORDER — ONDANSETRON HCL 4 MG/2ML IJ SOLN
INTRAMUSCULAR | Status: AC
  Filled 2021-08-19: qty 2

## 2021-08-19 MED ORDER — PHENYLEPHRINE HCL-NACL 20-0.9 MG/250ML-% IV SOLN
INTRAVENOUS | Status: DC | PRN
  Administered 2021-08-19: 45 ug/min via INTRAVENOUS

## 2021-08-19 MED ORDER — STERILE WATER FOR IRRIGATION IR SOLN
Status: DC | PRN
  Administered 2021-08-19: 2000 mL

## 2021-08-19 MED ORDER — FENTANYL CITRATE (PF) 100 MCG/2ML IJ SOLN: 25.0000 ug | INTRAMUSCULAR | Status: DC | PRN

## 2021-08-19 MED ORDER — GEMCITABINE CHEMO FOR BLADDER INSTILLATION 2000 MG
2000.0000 mg | Freq: Once | INTRAVENOUS | Status: DC
  Filled 2021-08-19: qty 52.6

## 2021-08-19 MED ORDER — LACTATED RINGERS IV SOLN: INTRAVENOUS | Status: DC

## 2021-08-19 MED ORDER — EPHEDRINE SULFATE (PRESSORS) 50 MG/ML IJ SOLN
INTRAMUSCULAR | Status: DC | PRN
  Administered 2021-08-19: 5 mg via INTRAVENOUS

## 2021-08-19 MED ORDER — IOHEXOL 180 MG/ML  SOLN
INTRAMUSCULAR | Status: DC | PRN
  Administered 2021-08-19: 10 mL

## 2021-08-19 MED ORDER — CHLORHEXIDINE GLUCONATE 0.12 % MT SOLN
OROMUCOSAL | Status: AC
  Administered 2021-08-19: 15 mL via OROMUCOSAL
  Filled 2021-08-19: qty 15

## 2021-08-19 MED ORDER — DEXAMETHASONE SODIUM PHOSPHATE 10 MG/ML IJ SOLN
INTRAMUSCULAR | Status: AC
  Filled 2021-08-19: qty 1

## 2021-08-19 MED ORDER — ONDANSETRON HCL 4 MG/2ML IJ SOLN
INTRAMUSCULAR | Status: DC | PRN
  Administered 2021-08-19: 4 mg via INTRAVENOUS

## 2021-08-19 SURGICAL SUPPLY — 36 items
BAG DRAIN CYSTO-URO LG1000N (MISCELLANEOUS) ×3 IMPLANT
BAG DRN RND TRDRP ANRFLXCHMBR (UROLOGICAL SUPPLIES) ×2
BAG URINE DRAIN 2000ML AR STRL (UROLOGICAL SUPPLIES) ×3 IMPLANT
BRUSH SCRUB EZ  4% CHG (MISCELLANEOUS) ×1
BRUSH SCRUB EZ 1% IODOPHOR (MISCELLANEOUS) ×3 IMPLANT
BRUSH SCRUB EZ 4% CHG (MISCELLANEOUS) ×2 IMPLANT
CATH FOLEY 2WAY  5CC 16FR (CATHETERS) ×1
CATH FOLEY 2WAY 5CC 16FR (CATHETERS) ×2
CATH URETL OPEN 5X70 (CATHETERS) ×3 IMPLANT
CATH URTH 16FR FL 2W BLN LF (CATHETERS) ×2 IMPLANT
DRAPE UTILITY 15X26 TOWEL STRL (DRAPES) ×3 IMPLANT
DRSG TELFA 4X3 1S NADH ST (GAUZE/BANDAGES/DRESSINGS) ×3 IMPLANT
ELECT LOOP 22F BIPOLAR SML (ELECTROSURGICAL)
ELECT REM PT RETURN 9FT ADLT (ELECTROSURGICAL)
ELECTRODE LOOP 22F BIPOLAR SML (ELECTROSURGICAL) IMPLANT
ELECTRODE REM PT RTRN 9FT ADLT (ELECTROSURGICAL) IMPLANT
GAUZE 4X4 16PLY ~~LOC~~+RFID DBL (SPONGE) ×6 IMPLANT
GLOVE SURG ENC MOIS LTX SZ6.5 (GLOVE) ×3 IMPLANT
GOWN STRL REUS W/ TWL LRG LVL3 (GOWN DISPOSABLE) ×4 IMPLANT
GOWN STRL REUS W/TWL LRG LVL3 (GOWN DISPOSABLE) ×6
GUIDEWIRE STR DUAL SENSOR (WIRE) ×3 IMPLANT
IV NS IRRIG 3000ML ARTHROMATIC (IV SOLUTION) ×3 IMPLANT
KIT TURNOVER CYSTO (KITS) ×3 IMPLANT
LOOP CUT BIPOLAR 24F LRG (ELECTROSURGICAL) IMPLANT
NDL SAFETY ECLIPSE 18X1.5 (NEEDLE) ×2 IMPLANT
NEEDLE HYPO 18GX1.5 SHARP (NEEDLE) ×3
PACK CYSTO AR (MISCELLANEOUS) ×3 IMPLANT
PAD ARMBOARD 7.5X6 YLW CONV (MISCELLANEOUS) ×3 IMPLANT
SET CYSTO W/LG BORE CLAMP LF (SET/KITS/TRAYS/PACK) ×3 IMPLANT
SET IRRIG Y TYPE TUR BLADDER L (SET/KITS/TRAYS/PACK) ×3 IMPLANT
SURGILUBE 2OZ TUBE FLIPTOP (MISCELLANEOUS) ×3 IMPLANT
SYR TOOMEY IRRIG 70ML (MISCELLANEOUS) ×3
SYRINGE TOOMEY IRRIG 70ML (MISCELLANEOUS) ×2 IMPLANT
WATER STERILE IRR 1000ML POUR (IV SOLUTION) ×3 IMPLANT
WATER STERILE IRR 3000ML UROMA (IV SOLUTION) IMPLANT
WATER STERILE IRR 500ML POUR (IV SOLUTION) ×3 IMPLANT

## 2021-08-19 NOTE — Discharge Instructions (Addendum)

## 2021-08-19 NOTE — Anesthesia Procedure Notes (Signed)
Procedure Name: LMA Insertion Date/Time: 08/19/2021 12:50 PM Performed by: Loletha Grayer, CRNA Pre-anesthesia Checklist: Patient identified, Patient being monitored, Timeout performed, Emergency Drugs available and Suction available Patient Re-evaluated:Patient Re-evaluated prior to induction Oxygen Delivery Method: Circle system utilized Preoxygenation: Pre-oxygenation with 100% oxygen Induction Type: IV induction Ventilation: Mask ventilation without difficulty LMA: LMA inserted LMA Size: 4.5 Number of attempts: 1 Tube secured with: Tape Dental Injury: Teeth and Oropharynx as per pre-operative assessment

## 2021-08-19 NOTE — Anesthesia Postprocedure Evaluation (Signed)
Anesthesia Post Note  Patient: Christian Pena  Procedure(s) Performed: TRANSURETHRAL RESECTION OF BLADDER TUMOR (TURBT) WITH INSTILLATION OF GEMCITABINE CYSTOSCOPY WITH RETROGRADE PYELOGRAM (Bilateral)  Patient location during evaluation: PACU Anesthesia Type: General Level of consciousness: awake and alert Pain management: pain level controlled Vital Signs Assessment: post-procedure vital signs reviewed and stable Respiratory status: spontaneous breathing, nonlabored ventilation, respiratory function stable and patient connected to nasal cannula oxygen Cardiovascular status: blood pressure returned to baseline and stable Postop Assessment: no apparent nausea or vomiting Anesthetic complications: no   No notable events documented.   Last Vitals:  Vitals:   08/19/21 1345 08/19/21 1400  BP: 127/88 140/89  Pulse: 68 65  Resp: 11 10  Temp:    SpO2: 93% 96%    Last Pain:  Vitals:   08/19/21 1400  TempSrc:   PainSc: 0-No pain                 Precious Haws Daanish Copes

## 2021-08-19 NOTE — Anesthesia Preprocedure Evaluation (Signed)
Anesthesia Evaluation  Patient identified by MRN, date of birth, ID band Patient awake    Reviewed: Allergy & Precautions, NPO status , Patient's Chart, lab work & pertinent test results  History of Anesthesia Complications Negative for: history of anesthetic complications  Airway Mallampati: III  TM Distance: <3 FB Neck ROM: full    Dental  (+) Chipped   Pulmonary neg shortness of breath, asthma , Patient abstained from smoking., former smoker,    Pulmonary exam normal        Cardiovascular Exercise Tolerance: Good hypertension, (-) angina+ CAD, + CABG and + Peripheral Vascular Disease  Normal cardiovascular exam     Neuro/Psych negative neurological ROS  negative psych ROS   GI/Hepatic Neg liver ROS, GERD  Controlled,  Endo/Other  negative endocrine ROS  Renal/GU      Musculoskeletal  (+) Arthritis ,   Abdominal   Peds  Hematology negative hematology ROS (+)   Anesthesia Other Findings Patient has cardiac clearance for this procedure.   Past Medical History: 08/18/2019: AAA (abdominal aortic aneurysm)     Comment:  a.) AAA duplex 08/18/2019 --> measured 3.1 cm. b.) TTE               04/22/2021: interval increase in size to 3.46 cm x 3.70               cm. No date: Aortic atherosclerosis ([place]) 05/08/2021: Coronary artery disease     Comment:  a.) LHC 05/08/2021: EF 45-50%; 55% pRCA, 95% RV branch,               30% dRCA, 100% OM2, 35% dLCx, 40% mLM, 30% RI, 100% pLAD,              100% mLAD; consult CVTS. b.) 3v CABG 05/29/2021:               LIMA-LAD, SVG-LPL-RI (sequenced) No date: Dyspnea on exertion No date: Erectile dysfunction No date: GERD (gastroesophageal reflux disease) 03/2021: History of 2019 novel coronavirus disease (COVID-19) No date: History of ETOH abuse No date: History of tobacco abuse No date: HLD (hyperlipidemia) No date: Hypertension No date: Ischemic cardiomyopathy     Comment:   a.) TTE 04/22/2021: mild LV dysfunction with EF 40%;               apical anterior, anteroseptal, inferior, and inferoseptal              akinesis; triv PR, mild MR/TR; G1DD. b.) TTE 08/08/2021:               moderate LV dysfunction with mild LVH; EF 35%; mild LA               enlargement, triv AR/PR, mild MR, mod TR. 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 No date: Vitamin D deficiency  Past Surgical History: 05/29/2021: CORONARY ARTERY BYPASS GRAFT; N/A     Comment:  Procedure: 3v CORONARTY ARTERY BYPASS GRAFT (LIMA-LAD,  SVG-LPL-RI); Location: [facility]; Surgeon: Sharmon Leyden, MD 87/68/1157: Raynelle Highland RETROGRADES; Bilateral     Comment:  Procedure: CYSTOSCOPY WITH RETROGRADE PYELOGRAM;                Surgeon: Hollice Espy, MD;  Location: [facility] ORS;                Service: Urology;  Laterality: Bilateral; 05/08/2021: LEFT HEART CATH AND CORONARY ANGIOGRAPHY; Left     Comment:  Procedure: LEFT HEART CATH AND CORONARY ANGIOGRAPHY;                Surgeon: Corey Skains, MD;  Location: [place]               CV LAB;  Service: Cardiovascular;  Laterality: Left; No date: TONSILLECTOMY 07/19/2020: TRANSURETHRAL RESECTION OF BLADDER TUMOR WITH MITOMYCIN- C; N/A     Comment:  Procedure: TRANSURETHRAL RESECTION OF BLADDER TUMOR WITH              Gemcitabine;  Surgeon: Hollice Espy, MD;  Location:               [facility] ORS;  Service: Urology;  Laterality: N/A;  BMI    Body Mass Index: 29.84 kg/m      Reproductive/Obstetrics negative OB ROS                              Anesthesia Physical Anesthesia Plan  ASA: 3  Anesthesia Plan: General LMA   Post-op Pain Management:    Induction: Intravenous  PONV Risk Score and Plan: Dexamethasone, Ondansetron, Midazolam and Treatment may vary due to age or medical condition  Airway Management Planned: LMA  Additional Equipment:   Intra-op Plan:   Post-operative Plan: Extubation in OR  Informed Consent: I have reviewed the patients History and Physical, chart, labs and discussed the procedure including the risks, benefits and alternatives for the proposed anesthesia with the patient or authorized representative who has indicated his/her understanding and acceptance.     Dental Advisory Given  Plan Discussed with: Anesthesiologist, CRNA and Surgeon  Anesthesia Plan Comments: (Patient consented for risks of anesthesia including but not limited to:  - adverse reactions to medications - damage to eyes, teeth, lips or other oral mucosa - nerve damage due to positioning  - sore throat or hoarseness - Damage to heart, brain, nerves, lungs, other parts of body or loss of life  Patient voiced understanding.)        Anesthesia Quick Evaluation

## 2021-08-19 NOTE — Interval H&P Note (Signed)
History and Physical Interval Note:  08/19/2021 12:19 PM  Christian Pena  has presented today for surgery, with the diagnosis of Bladder Tumor.  The various methods of treatment have been discussed with the patient and family. After consideration of risks, benefits and other options for treatment, the patient has consented to  Procedure(s): TRANSURETHRAL RESECTION OF BLADDER TUMOR (TURBT) WITH INSTILLATION OF GEMCITABINE (N/A) CYSTOSCOPY WITH RETROGRADE PYELOGRAM (Bilateral) as a surgical intervention.  The patient's history has been reviewed, patient examined, no change in status, stable for surgery.  I have reviewed the patient's chart and labs.  Questions were answered to the patient's satisfaction.    RRR CTAB  Hollice Espy

## 2021-08-19 NOTE — Op Note (Signed)
Date of procedure: 08/19/21  Preoperative diagnosis:  History of bladder cancer Right lateral bladder wall tumor  Postoperative diagnosis:  Same as above  Procedure: Cystoscopy TURBT, small less than 1 cm tumor Bilateral retrograde pyelogram Instillation of intravesical chemotherapy  Surgeon: Hollice Espy, MD  Anesthesia: General  Complications: None  Intraoperative findings: Very small approximately 5 mm superficial appearing tumor on the right posterior bladder wall, no other tumors appreciated.  Unremarkable bilateral retrogrades.  Postprocedural gemcitabine.  EBL: Minimal  Specimens: Bladder tumor  Drains: 16 French Foley  Indication: Christian Pena is a 71 y.o. patient with personal history of bladder cancer found to have very small recurrence.  After reviewing the management options for treatment, he elected to proceed with the above surgical procedure(s). We have discussed the potential benefits and risks of the procedure, side effects of the proposed treatment, the likelihood of the patient achieving the goals of the procedure, and any potential problems that might occur during the procedure or recuperation. Informed consent has been obtained.  Description of procedure:  The patient was taken to the operating room and general anesthesia was induced.  The patient was placed in the dorsal lithotomy position, prepped and draped in the usual sterile fashion, and preoperative antibiotics were administered. A preoperative time-out was performed.   21 [place] scope was advanced per urethra into the bladder.  Initially, had trouble distending the bladder due to spasm.  Ultimately, the spasm broke and was able to feel the bladder.  Bladder was carefully inspected.  There was 1 very small less than 5 mm tumor identified on the right posterior bladder wall which very subtle in appearance.  It was papillary in nature, carpeted like and had a superficial appearance.  The remainder of  the bladder was unremarkable other than for moderate to heavy trabeculation.  Attention was turned to the right ureteral orifice which was cannulated using an open-ended ureteral catheter.  A gentle retrograde pyelogram was performed which revealed no filling defects or hydroureteronephrosis.  Attention was turned to the left ureteral orifice and the same procedure was performed.  This was also unremarkable without hydronephrosis or filling defects.  Next, cold cup biopsy forceps were used to resect the tumor which only actually took 1 cold cup bite in order to do so.  The base of this and then was fulgurated using Bovie electrocautery.  Hemostasis was excellent.  A 16 French Foley catheter was then placed and the balloon filled with 10 cc of sterile water.  Patient was then cleaned and dried, repositioned supine position, reversed of anesthesia, taken to PACU in stable condition.  2000 mg of intravesical gemcitabine was instilled to the bladder.  It was allowed to dwell for 1 hour.  After 1 hour, the catheter was unclamped and the chemotherapy was drained.  This was well-tolerated.  The Foley was removed.  He was discharged home in stable condition.  Plan: I will have him follow-up next week in the office or possibly virtual visit depending on his preference to discuss surgical pathology and follow-up plan.  Both he and his wife are agreeable with this.  Hollice Espy, M.D.

## 2021-08-19 NOTE — Transfer of Care (Signed)
Immediate Anesthesia Transfer of Care Note  Patient: Christian Pena  Procedure(s) Performed: TRANSURETHRAL RESECTION OF BLADDER TUMOR (TURBT) WITH INSTILLATION OF GEMCITABINE CYSTOSCOPY WITH RETROGRADE PYELOGRAM (Bilateral)  Patient Location: PACU  Anesthesia Type:General  Level of Consciousness: awake  Airway & Oxygen Therapy: Patient Spontanous Breathing and Patient connected to face mask oxygen  Post-op Assessment: Report given to RN and Post -op Vital signs reviewed and stable  Post vital signs: Reviewed and stable  Last Vitals:  Vitals Value Taken Time  BP 127/77 08/19/21 1328  Temp    Pulse 69 08/19/21 1328  Resp 13 08/19/21 1328  SpO2 99 % 08/19/21 1328    Last Pain:  Vitals:   08/19/21 1048  TempSrc: Temporal  PainSc: 0-No pain         Complications: No notable events documented.

## 2021-08-20 ENCOUNTER — Encounter: Payer: Self-pay | Admitting: Urology

## 2021-08-21 ENCOUNTER — Other Ambulatory Visit: Payer: Self-pay

## 2021-08-21 DIAGNOSIS — Z951 Presence of aortocoronary bypass graft: Secondary | ICD-10-CM | POA: Diagnosis not present

## 2021-08-21 LAB — SURGICAL PATHOLOGY

## 2021-08-21 NOTE — Progress Notes (Signed)
Daily Session Note  Patient Details  Name: Christian Pena MRN: [id number] Date of Birth: [birth year] Referring Provider:   Flowsheet Row Cardiac Rehab from 07/10/2021 in [facility] Cardiac and Pulmonary Rehab  Referring Provider Christian Pena       Encounter Date: 08/21/2021  Check In:  Session Check In - 08/21/21 0738       Check-In   Supervising physician immediately available to respond to emergencies See telemetry face sheet for immediately available ER MD    Location [facility]-Cardiac & Pulmonary Rehab    Staff Present Birdie Sons, MPA, RN;Melissa [place], RDN, Rowe Pavy, BA, ACSM CEP, Exercise Physiologist    Virtual Visit No    Medication changes reported     No    Fall or balance concerns reported    No    Tobacco Cessation No Change    Warm-up and Cool-down Performed on first and last piece of equipment    Resistance Training Performed Yes    VAD Patient? No    PAD/SET Patient? No      Pain Assessment   Currently in Pain? No/denies                Social History   Tobacco Use  Smoking Status Former   Types: Cigarettes   Quit date: 05/2021   Years since quitting: 0.2  Smokeless Tobacco Never    Goals Met:  Independence with exercise equipment Exercise tolerated well No report of concerns or symptoms today Strength training completed today  Goals Unmet:  Not Applicable  Comments: Pt able to follow exercise prescription today without complaint.  Will continue to monitor for progression.    Dr. Emily Pena is Medical Director for [place].  Dr. Ottie Pena is Medical Director for [facility] Pulmonary Rehabilitation.

## 2021-08-23 ENCOUNTER — Other Ambulatory Visit: Payer: Self-pay

## 2021-08-23 ENCOUNTER — Encounter: Payer: Medicare HMO | Admitting: *Deleted

## 2021-08-23 DIAGNOSIS — Z951 Presence of aortocoronary bypass graft: Secondary | ICD-10-CM

## 2021-08-23 NOTE — Progress Notes (Signed)
Daily Session Note ° °Patient Details  °Name: Durk G Branca °MRN: [id number] °Date of Birth: 06/11/1951 °Referring Provider:   °Flowsheet Row Cardiac Rehab from 07/10/2021 in [facility] Cardiac and Pulmonary Rehab  °Referring Provider [place]  ° °  ° ° °Encounter Date: 08/23/2021 ° °Check In: ° Session Check In - 08/23/21 0840   ° °  ° Check-In  ° Supervising physician immediately available to respond to emergencies See telemetry face sheet for immediately available ER MD   ° Location [facility] Pul[facility]   ° Staff Present Jessica Hawkins, MA, RCEP, CCRP, CCET;Joseph Hood, RCP,RRT,BSRT;Susanne Bice, RN, BSN, CCRP   ° Virtual Visit No   ° Medication changes reported     No   ° Fall or balance concerns reported    No   ° Warm-up and Cool-down Performed on first and last piece of equipment   ° Resistance Training Performed Yes   ° VAD Patient? No   ° PAD/SET Patient? No   °  ° Pain Assessment  ° Currently in Pain? No/denies   ° °  °  ° °  ° ° ° ° ° °Social History  ° °Tobacco Use  °Smoking Status Former  ° Types: Cigarettes  ° Quit date: 05/2021  ° Years since quitting: 0.2  °Smokeless Tobacco Never  ° ° °Goals Met:  °Independence with exercise equipment °Exercise tolerated well °No report of concerns or symptoms today ° °Goals Unmet:  °Not Applicable ° °Comments: Pt able to follow exercise prescription today without complaint.  Will continue to monitor for progression. ° ° ° °Dr. Mark Miller is Medical Director for [facility].  °Dr. Fuad Aleskerov is Medical Director for [facility]. °

## 2021-08-26 ENCOUNTER — Other Ambulatory Visit: Payer: Self-pay

## 2021-08-26 ENCOUNTER — Encounter: Payer: Medicare HMO | Admitting: *Deleted

## 2021-08-26 DIAGNOSIS — Z951 Presence of aortocoronary bypass graft: Secondary | ICD-10-CM

## 2021-08-26 NOTE — Progress Notes (Signed)
Daily Session Note  Patient Details  Name: Christian Pena MRN: [id number] Date of Birth: Feb 08, 1951 Referring Provider:   Flowsheet Row Cardiac Rehab from 07/10/2021 in [facility] Cardiac and Pulmonary Rehab  Referring Provider [name]       Encounter Date: 08/26/2021  Check In:  Session Check In - 08/26/21 0840       Check-In   Supervising physician immediately available to respond to emergencies See telemetry face sheet for immediately available ER MD    Location [facility]Card[facility] & Pulmonary Rehab    Staff Present Heath Lark, RN, BSN, CCRP;Joseph Roca, RCP,RRT,BSRT;Kelly Arjay, [place], ACSM CEP, Exercise Physiologist    Virtual Visit No    Medication changes reported     No    Fall or balance concerns reported    No    Warm-up and Cool-down Performed on first and last piece of equipment    Resistance Training Performed Yes    PAD/SET Patient? No      Pain Assessment   Currently in Pain? No/denies                Social History   Tobacco Use  Smoking Status Former   Types: Cigarettes   Quit date: 05/2021   Years since quitting: 0.2  Smokeless Tobacco Never    Goals Met:  Independence with exercise equipment Exercise tolerated well No report of concerns or symptoms today  Goals Unmet:  Not Applicable  Comments: Pt able to follow exercise prescription today without complaint.  Will continue to monitor for progression.    Dr. Emily Filbert is Medical Director for [place].  Dr. Ottie Glazier is Medical Director for [facility] Pulmonary Rehabilitation.

## 2021-08-27 NOTE — Progress Notes (Signed)
09/02/21 3:24 PM   Christian Pena 12-24-1950 [id number]  Referring provider:  Sallee Lange, NP [place],  [place] [address] No chief complaint on file.   HPI: Christian Pena is a 71 y.o.male with a personal history of bladder cancer and BPH with urinary obstruction, who presents today for 1-2 week post-op follow-up.   He is s/p TURBT on 07/19/2020 that revealed high-grade noninvasive urothelial carcinoma; 1 cm. There were no other bladder lesion identified and the muscularis propria was appreciated and not involved.   Cystoscopy on 07/30/2021 showed <1cm tumor on right bladder wall consistent with a small reoccurrence.   He underwent a TURBT on 08/19/2020. Intraoperative findings showed very small approximately 5 mm superficial appearing tumor on the right posterior bladder wall, no other tumors appreciated.  Unremarkable bilateral retrogrades.  Postprocedural gemcitabine.  He did undergo a 6-week course of induction BCG completed in 09/2020.  This was well-tolerated.  Surgical pathology revealed polypoid fragment of reactive urothelial mucosa with lymphoid aggregate and negative malignancy.   PMH: Past Medical History:  Diagnosis Date   AAA (abdominal aortic aneurysm) 08/18/2019   a.) AAA duplex 08/18/2019 --> measured 3.1 cm. b.) TTE 04/22/2021: interval increase in size to 3.46 cm x 3.70 cm.   Aortic atherosclerosis ([place])    Coronary artery disease 05/08/2021   a.) LHC 05/08/2021: EF 45-50%; 55% pRCA, 95% RV branch, 30% dRCA, 100% OM2, 35% dLCx, 40% mLM, 30% RI, 100% pLAD, 100% mLAD; consult CVTS. b.) 3v CABG 05/29/2021: LIMA-LAD, SVG-LPL-RI (sequenced)   Dyspnea on exertion    Erectile dysfunction    GERD (gastroesophageal reflux disease)    History of 2019 novel coronavirus disease (COVID-19) 03/2021   History of ETOH abuse    History of tobacco abuse    HLD (hyperlipidemia)    Hypertension    Ischemic cardiomyopathy    a.) TTE 04/22/2021: mild LV  dysfunction with EF 40%; apical anterior, anteroseptal, inferior, and inferoseptal akinesis; triv PR, mild MR/TR; G1DD. b.) TTE 08/08/2021: moderate LV dysfunction with mild LVH; EF 35%; mild LA enlargement, triv AR/PR, mild MR, mod TR.   Mild intermittent asthma    Osteoarthritis    Peripheral vascular disease (HCC)    S/P CABG x 3 05/29/2021   a.) 3v CABG: LIMA-LAD, SVG-LPL-RI (sequenced)   Urothelial carcinoma of bladder without invasion of muscle ([place]) 07/19/2020   a.) Bx (+) high grade non-invasive papillary urothelial carcinoma; no muscularis propria involvement; s.p TURBT with gemcitabine.   Vitamin D deficiency     Surgical History: Past Surgical History:  Procedure Laterality Date   CORONARY ARTERY BYPASS GRAFT N/A 05/29/2021   Procedure: 3v CORONARTY ARTERY BYPASS GRAFT (LIMA-LAD, SVG-LPL-RI); Location: [facility]; Surgeon: Sharmon Leyden, MD   CYSTOSCOPY W/ RETROGRADES Bilateral 07/19/2020   Procedure: CYSTOSCOPY WITH RETROGRADE PYELOGRAM;  Surgeon: Hollice Espy, MD;  Location: [facility] ORS;  Service: Urology;  Laterality: Bilateral;   CYSTOSCOPY W/ RETROGRADES Bilateral 08/19/2021   Procedure: CYSTOSCOPY WITH RETROGRADE PYELOGRAM;  Surgeon: Hollice Espy, MD;  Location: [facility] OR[facility];  Service: Urology;  Laterality: Bilateral;   LEFT HEART CATH AND CORONARY ANGIOGRAPHY Left 05/08/2021   Procedure: LEFT HEART CATH AND CORONARY ANGIOGRAPHY;  Surgeon: Corey Skains, MD;  Location: [place] CV LAB;  Service: Cardiovascular;  Laterality: Left;   TONSILLECTOMY     TRANSURETHRAL RESECTION OF BLADDER TUMOR N/A 08/19/2021   Procedure: TRANSURETHRAL RESECTION OF BLADDER TUMOR (TURBT) WITH INSTILLATION OF GEMCITABINE;  Surgeon: Hollice Espy, MD;  Location:  [facility] ORS;  Service: Urology;  Laterality: N/A;   TRANSURETHRAL RESECTION OF BLADDER TUMOR WITH MITOMYCIN-C N/A 07/19/2020   Procedure: TRANSURETHRAL RESECTION OF BLADDER TUMOR WITH Gemcitabine;  Surgeon: Hollice Espy, MD;  Location: [facility]  ORS;  Service: Urology;  Laterality: N/A;    Home Medications:  Allergies as of 08/28/2021   No Known Allergies      Medication List        Accurate as of August 28, 2021 11:59 PM. If you have any questions, ask your nurse or doctor.          aspirin 81 MG EC tablet Take 81 mg by mouth daily.   metoprolol succinate 25 MG 24 hr tablet Commonly known as: TOPROL-XL Take 12.5 mg by mouth daily.   multivitamin with minerals tablet Take 1 tablet by mouth daily. Centrum silver Adult 50 +   omeprazole 20 MG capsule Commonly known as: PRILOSEC Take 20 mg by mouth daily.   rosuvastatin 40 MG tablet Commonly known as: CRESTOR Take 40 mg by mouth daily.   tamsulosin 0.4 MG Caps capsule Commonly known as: FLOMAX Take 1 capsule (0.4 mg total) by mouth daily.        Allergies: No Known Allergies  Family History: No family history on file.  Social History:  reports that he quit smoking about 3 months ago. His smoking use included cigarettes. He has never used smokeless tobacco. He reports current alcohol use of about 4.0 - 5.0 standard drinks per week. He reports that he does not use drugs.   Physical Exam: BP 119/75    Pulse 73    Ht 6' (1.829 m)    Wt 220 lb (99.8 kg)    BMI 29.84 kg/m   Constitutional:  Alert and oriented, No acute distress. HEENT: [place] AT, moist mucus membranes.  Trachea midline, no masses. Cardiovascular: No clubbing, cyanosis, or edema. Respiratory: Normal respiratory effort, no increased work of breathing. Skin: No rashes, bruises or suspicious lesions. Neurologic: Grossly intact, no focal deficits, moving all 4 extremities. Psychiatric: Normal mood and affect.  Laboratory Data: Lab Results  Component Value Date   CREATININE 0.82 07/11/2020   Assessment & Plan:    History of bladder cancer  - S/p TURBT , clinically biopsy specimen was consistent with recurrence however this appeared more polyp-like on pathologic analysis -Still have  fairly high clinical suspicion for recurrent bladder cancer, as such, would recommend pursuing maintenance BCG at minimum, he is agreeable this plan - Discussed the option of maintenance BCGx3. He is agreeable to this plan.  - Will cystoscopy him again in 3 months    I,Jahron Hunsinger,acting as a scribe for Hollice Espy, MD.,have documented all relevant documentation on the behalf of Hollice Espy, MD,as directed by  Hollice Espy, MD while in the presence of Hollice Espy, MD.  I have reviewed the above documentation for accuracy and completeness, and I agree with the above.   Hollice Espy, MD   [facility] [address] [place], [place] [address] [phone]

## 2021-08-28 ENCOUNTER — Encounter: Payer: Self-pay | Admitting: Urology

## 2021-08-28 ENCOUNTER — Ambulatory Visit (INDEPENDENT_AMBULATORY_CARE_PROVIDER_SITE_OTHER): Payer: Medicare HMO | Admitting: Urology

## 2021-08-28 ENCOUNTER — Other Ambulatory Visit: Payer: Self-pay

## 2021-08-28 VITALS — BP 119/75 | HR 73 | Ht 72.0 in | Wt 220.0 lb

## 2021-08-28 DIAGNOSIS — Z951 Presence of aortocoronary bypass graft: Secondary | ICD-10-CM

## 2021-08-28 DIAGNOSIS — Z8551 Personal history of malignant neoplasm of bladder: Secondary | ICD-10-CM | POA: Diagnosis not present

## 2021-08-28 NOTE — Progress Notes (Signed)
Daily Session Note  Patient Details  Name: Christian Pena MRN: [id number] Date of Birth: [birth year] Referring Provider:   Flowsheet Row Cardiac Rehab from 07/10/2021 in [facility] Cardiac and Pulmonary Rehab  Referring Provider [name]       Encounter Date: 08/28/2021  Check In:  Session Check In - 08/28/21 0750       Check-In   Supervising physician immediately available to respond to emergencies See telemetry face sheet for immediately available ER MD    Location [facility]-Cardiac & Pulmonary Rehab    Staff Present Birdie Sons, MPA, RN;Joseph Tessie Fass, RCP,RRT,BSRT;Amanda [place], [place], ACSM CEP, Exercise Physiologist    Virtual Visit No    Medication changes reported     No    Fall or balance concerns reported    No    Tobacco Cessation No Change    Warm-up and Cool-down Performed on first and last piece of equipment    Resistance Training Performed Yes    VAD Patient? No    PAD/SET Patient? No      Pain Assessment   Currently in Pain? No/denies                Social History   Tobacco Use  Smoking Status Former   Types: Cigarettes   Quit date: 05/2021   Years since quitting: 0.2  Smokeless Tobacco Never    Goals Met:  Independence with exercise equipment Exercise tolerated well No report of concerns or symptoms today Strength training completed today  Goals Unmet:  Not Applicable  Comments: Pt able to follow exercise prescription today without complaint.  Will continue to monitor for progression.    Dr. Emily Filbert is Medical Director for [facility].  Dr. Ottie Glazier is Medical Director for [facility] Pulmonary Rehabilitation.

## 2021-08-30 ENCOUNTER — Other Ambulatory Visit: Payer: Self-pay

## 2021-08-30 ENCOUNTER — Encounter: Payer: Medicare HMO | Admitting: *Deleted

## 2021-08-30 DIAGNOSIS — Z951 Presence of aortocoronary bypass graft: Secondary | ICD-10-CM

## 2021-08-30 NOTE — Progress Notes (Signed)
Daily Session Note  Patient Details  Name: Christian Pena MRN: [id number] Date of Birth: Nov 26, 1950 Referring Provider:   Flowsheet Row Cardiac Rehab from 07/10/2021 in [facility] Cardiac [facility] Pul[facility]  Referring Provider [name]       Encounter Date: 08/30/2021  Check In:  Session Check In - 08/30/21 0801       Check-In   Supervising physician immediately available to respond to emergencies See telemetry face sheet for immediately available ER MD    Location [facility]-Cardiac & Pulmonary Rehab    Staff Present Heath Lark, RN, BSN, CCRP;Joseph [place], [place], [place], [place], CCRP, CCET    Virtual Visit No    Medication changes reported     No    Fall or balance concerns reported    No    Tobacco Cessation Use Increase    Current number of cigarettes/nicotine per day     1    Warm-up and Cool-down Performed on first and last piece of equipment    Resistance Training Performed Yes    VAD Patient? No    PAD/SET Patient? No      Pain Assessment   Currently in Pain? No/denies                Social History   Tobacco Use  Smoking Status Former   Types: Cigarettes   Quit date: 05/2021   Years since quitting: 0.2  Smokeless Tobacco Never    Goals Met:  Independence with exercise equipment Exercise tolerated well No report of concerns or symptoms today  Goals Unmet:  Not Applicable  Comments: Pt able to follow exercise prescription today without complaint.  Will continue to monitor for progression.    Dr. Emily Filbert is Medical Director for [facility].  Dr. Ottie Glazier is Medical Director for [facility] Pulmonary Rehabilitation.

## 2021-09-02 ENCOUNTER — Other Ambulatory Visit: Payer: Self-pay

## 2021-09-02 ENCOUNTER — Encounter: Payer: Medicare HMO | Admitting: *Deleted

## 2021-09-02 DIAGNOSIS — Z951 Presence of aortocoronary bypass graft: Secondary | ICD-10-CM | POA: Diagnosis not present

## 2021-09-02 NOTE — Progress Notes (Signed)
Daily Session Note  Patient Details  Name: Christian Pena MRN: [id number] Date of Birth: 01-24-1951 Referring Provider:   Flowsheet Row Cardiac Rehab from 07/10/2021 in [facility] Cardiac and Pulmonary Rehab  Referring Provider [name]       Encounter Date: 09/02/2021  Check In:  Session Check In - 09/02/21 0835       Check-In   Supervising physician immediately available to respond to emergencies See telemetry face sheet for immediately available ER MD    Location [facility]-Cardiac & Pulmonary Rehab    Staff Present Earlean Shawl, BS, ACSM CEP, Exercise Physiologist;Joseph [place], [place];Heath Lark, RN, BSN, CCRP    Virtual Visit No    Medication changes reported     No    Fall or balance concerns reported    No    Warm-up and Cool-down Performed on first and last piece of equipment    Resistance Training Performed Yes    VAD Patient? No    PAD/SET Patient? No      Pain Assessment   Currently in Pain? No/denies                Social History   Tobacco Use  Smoking Status Former   Types: Cigarettes   Quit date: 05/2021   Years since quitting: 0.3  Smokeless Tobacco Never    Goals Met:  Independence with exercise equipment Exercise tolerated well No report of concerns or symptoms today  Goals Unmet:  Not Applicable  Comments: Pt able to follow exercise prescription today without complaint.  Will continue to monitor for progression.    Dr. Emily Filbert is Medical Director for [facility].  Dr. Ottie Glazier is Medical Director for [facility] Pulmonary Rehabilitation.

## 2021-09-04 ENCOUNTER — Other Ambulatory Visit: Payer: Self-pay

## 2021-09-04 ENCOUNTER — Encounter: Payer: Self-pay | Admitting: *Deleted

## 2021-09-04 DIAGNOSIS — Z951 Presence of aortocoronary bypass graft: Secondary | ICD-10-CM

## 2021-09-04 NOTE — Progress Notes (Signed)
Cardiac Individual Treatment Plan  Patient Details  Name: SHAMEEK NYQUIST MRN: [id number] Date of Birth: Oct 05, 1950 Referring Provider:   Flowsheet Row Cardiac Rehab from 07/10/2021 in [facility] Cardiac and Pulmonary Rehab  Referring Provider [name]       Initial Encounter Date:  Flowsheet Row Cardiac Rehab from 07/10/2021 in [facility] Cardiac and Pulmonary Rehab  Date 07/10/21       Visit Diagnosis: S/P CABG x 3  Patient's Home Medications on Admission:  Current Outpatient Medications:    aspirin 81 MG EC tablet, Take 81 mg by mouth daily., Disp: , Rfl:    metoprolol succinate (TOPROL-XL) 25 MG 24 hr tablet, Take 12.5 mg by mouth daily., Disp: , Rfl:    Multiple Vitamins-Minerals (MULTIVITAMIN WITH MINERALS) tablet, Take 1 tablet by mouth daily. Centrum silver Adult 50 +, Disp: , Rfl:    omeprazole (PRILOSEC) 20 MG capsule, Take 20 mg by mouth daily., Disp: , Rfl:    rosuvastatin (CRESTOR) 40 MG tablet, Take 40 mg by mouth daily., Disp: , Rfl:    tamsulosin (FLOMAX) 0.4 MG CAPS capsule, Take 1 capsule (0.4 mg total) by mouth daily., Disp: 30 capsule, Rfl: 11  Past Medical History: Past Medical History:  Diagnosis Date   AAA (abdominal aortic aneurysm) 08/18/2019   a.) AAA duplex 08/18/2019 --> measured 3.1 cm. b.) TTE 04/22/2021: interval increase in size to 3.46 cm x 3.70 cm.   Aortic atherosclerosis ([place])    Coronary artery disease 05/08/2021   a.) LHC 05/08/2021: EF 45-50%; 55% pRCA, 95% RV branch, 30% dRCA, 100% OM2, 35% dLCx, 40% mLM, 30% RI, 100% pLAD, 100% mLAD; consult CVTS. b.) 3v CABG 05/29/2021: LIMA-LAD, SVG-LPL-RI (sequenced)   Dyspnea on exertion    Erectile dysfunction    GERD (gastroesophageal reflux disease)    History of 2019 novel coronavirus disease (COVID-19) 03/2021   History of ETOH abuse    History of tobacco abuse    HLD (hyperlipidemia)    Hypertension    Ischemic cardiomyopathy    a.) TTE 04/22/2021: mild LV dysfunction with EF 40%; apical anterior,  anteroseptal, inferior, and inferoseptal akinesis; triv PR, mild MR/TR; G1DD. b.) TTE 08/08/2021: moderate LV dysfunction with mild LVH; EF 35%; mild LA enlargement, triv AR/PR, mild MR, mod TR.   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   Vitamin D deficiency     Tobacco Use: Social History   Tobacco Use  Smoking Status Former   Types: Cigarettes   Quit date: 05/2021   Years since quitting: 0.3  Smokeless Tobacco Never    Labs: Recent Review Flowsheet Data   There is no flowsheet data to display.      Exercise Target Goals: Exercise Program Goal: Individual exercise prescription set using results from initial 6 min walk test and THRR while considering  patients activity barriers and safety.   Exercise Prescription Goal: Initial exercise prescription builds to 30-45 minutes a day of aerobic activity, 2-3 days per week.  Home exercise guidelines will be given to patient during program as part of exercise prescription that the participant will acknowledge.   Education: Aerobic Exercise: - Group verbal and visual presentation on the components of exercise  prescription. Introduces F.I.T.T principle from ACSM for exercise prescriptions.  Reviews F.I.T.T. principles of aerobic exercise including progression. Written material given at graduation.   Education: Resistance Exercise: - Group verbal and visual presentation on the components of exercise prescription. Introduces F.I.T.T principle from ACSM for exercise prescriptions  Reviews F.I.T.T. principles of resistance exercise including progression. Written material given at graduation. Flowsheet Row Cardiac  Rehab from 08/28/2021 in [facility] Cardiac and Pulmonary Rehab  Date 07/17/21  Educator [facility]  Instruction Review Code 1- Verbalizes Understanding        Education: Exercise & Equipment Safety: - Individual verbal instruction and demonstration of equipment use and safety with use of the equipment. Flowsheet Row Cardiac Rehab from 08/28/2021 in [facility] Cardiac and Pulmonary Rehab  Date 07/10/21  Educator AS  Instruction Review Code 1- Verbalizes Understanding       Education: Exercise Physiology & General Exercise Guidelines: - Group verbal and written instruction with models to review the exercise physiology of the cardiovascular system and associated critical values. Provides general exercise guidelines with specific guidelines to those with heart or lung disease.  Flowsheet Row Cardiac Rehab from 08/28/2021 in [facility] Cardiac and Pulmonary Rehab  Education need identified 07/10/21       Education: Flexibility, Balance, Mind/Body Relaxation: - Group verbal and visual presentation with interactive activity on the components of exercise prescription. Introduces F.I.T.T principle from ACSM for exercise prescriptions. Reviews F.I.T.T. principles of flexibility and balance exercise training including progression. Also discusses the mind body connection.  Reviews various relaxation techniques to help reduce and manage stress (i.e. Deep breathing, progressive muscle relaxation, and visualization). Balance handout provided to take home. Written material given at graduation.   Activity Barriers & Risk Stratification:  Activity Barriers & Cardiac Risk Stratification - 06/19/21 1402       Activity Barriers & Cardiac Risk Stratification   Activity Barriers Balance Concerns    Cardiac Risk Stratification High             6 Minute Walk:  6 Minute Walk     Row Name 07/10/21 0915         6 Minute Walk   Phase Initial     Distance 1365 feet     Walk Time 6 minutes     # of Rest Breaks 0      MPH 2.6     METS 2.9     RPE 7     Perceived Dyspnea  0     VO2 Peak 10.13     Symptoms Yes (comment)     Comments little dizziness at beginning that went away     Resting HR 59 bpm     Resting BP 114/60     Resting Oxygen Saturation  100 %     Exercise Oxygen Saturation  during 6 min walk 98 %     Max Ex. HR 90 bpm     Max Ex. BP 118/72     2 Minute Post BP 108/68              Oxygen Initial Assessment:   Oxygen Re-Evaluation:   Oxygen Discharge (Final Oxygen Re-Evaluation):   Initial Exercise Prescription:  Initial Exercise Prescription - 07/10/21 0900       Date of Initial Exercise RX and Referring Provider   Date 07/10/21    Referring Provider [name]      Oxygen   Maintain Oxygen Saturation 88% or higher      Treadmill   MPH  2.3    Grade 0.5    Minutes 15    METs 2.9      Recumbant Bike   Level 3    RPM 60    Minutes 15    METs 2.9      REL-XR   Level 3    Speed 50    Minutes 15    METs 2.9      T5 Nustep   Level 2    SPM 80    Minutes 15    METs 2.9      Prescription Details   Frequency (times per week) 3    Duration Progress to 30 minutes of continuous aerobic without signs/symptoms of physical distress      Intensity   THRR 40-80% of Max Heartrate 96-133    Ratings of Perceived Exertion 11-13    Perceived Dyspnea 0-4      Resistance Training   Training Prescription Yes    Weight 3 lb    Reps 10-15             Perform Capillary Blood Glucose checks as needed.  Exercise Prescription Changes:   Exercise Prescription Changes     Row Name 07/10/21 0900 07/22/21 0800 08/05/21 1700         Response to Exercise   Blood Pressure (Admit) 114/60 -- 118/68     Blood Pressure (Exercise) 118/72 -- 118/84     Blood Pressure (Exit) 108/68 -- 102/64     Heart Rate (Admit) 59 bpm -- 67 bpm     Heart Rate (Exercise) 90 bpm -- 111 bpm     Heart Rate (Exit) 77 bpm -- 81 bpm     Oxygen Saturation (Admit) 100 % -- --     Oxygen  Saturation (Exercise) 98 % -- --     Rating of Perceived Exertion (Exercise) 7 -- 13     Perceived Dyspnea (Exercise) 0 -- --     Symptoms little dizzy  dizziness resolved by end of walk --  dizziness resolved by end of walk none     Duration -- -- Progress to 30 minutes of  aerobic without signs/symptoms of physical distress     Intensity -- -- THRR unchanged       Progression   Progression -- -- Continue to progress workloads to maintain intensity without signs/symptoms of physical distress.     Average METs -- -- 3.4       Resistance Training   Training Prescription -- Yes Yes     Weight -- 3 lb 4 lb     Reps -- 10-15 10-15       Interval Training   Interval Training -- -- No       Treadmill   MPH -- 2.3 2.6     Grade -- 0.5 1.5     Minutes -- 15 15     METs -- 2.9 3.53       Recumbant Bike   Level -- 3 --     RPM -- 60 --     Minutes -- 15 --     METs -- 2.9 --       NuStep   Level -- -- 5     Minutes -- -- 15     METs -- -- 3.3       REL-XR   Level -- 3 4     Speed -- 50 --     Minutes -- 15 15  METs -- 2.9 3.43       T5 Nustep   Level -- 2 --     SPM -- 80 --     Minutes -- 15 --     METs -- 2.9 --       Home Exercise Plan   Plans to continue exercise at -- Home (comment)  use RB and walk at home on off days of cardiac rehab. --     Frequency -- Add 2 additional days to program exercise sessions. --     Initial Home Exercises Provided -- 07/22/21 --       Oxygen   Maintain Oxygen Saturation -- -- 88% or higher              Exercise Comments:   Exercise Comments     Row Name 07/17/21 0754           Exercise Comments First full day of exercise!  Patient was oriented to gym and equipment including functions, settings, policies, and procedures.  Patient's individual exercise prescription and treatment plan were reviewed.  All starting workloads were established based on the results of the 6 minute walk test done at initial orientation visit.   The plan for exercise progression was also introduced and progression will be customized based on patient's performance and goals.                Exercise Goals and Review:   Exercise Goals     Row Name 07/10/21 0930             Exercise Goals   Increase Physical Activity Yes       Intervention Provide advice, education, support and counseling about physical activity/exercise needs.;Develop an individualized exercise prescription for aerobic and resistive training based on initial evaluation findings, risk stratification, comorbidities and participant's personal goals.       Expected Outcomes Short Term: Attend rehab on a regular basis to increase amount of physical activity.;Long Term: Add in home exercise to make exercise part of routine and to increase amount of physical activity.;Long Term: Exercising regularly at least 3-5 days a week.       Increase Strength and Stamina Yes       Intervention Provide advice, education, support and counseling about physical activity/exercise needs.;Develop an individualized exercise prescription for aerobic and resistive training based on initial evaluation findings, risk stratification, comorbidities and participant's personal goals.       Expected Outcomes Short Term: Increase workloads from initial exercise prescription for resistance, speed, and METs.;Short Term: Perform resistance training exercises routinely during rehab and add in resistance training at home;Long Term: Improve cardiorespiratory fitness, muscular endurance and strength as measured by increased METs and functional capacity (6MWT)       Able to understand and use rate of perceived exertion (RPE) scale Yes       Intervention Provide education and explanation on how to use RPE scale       Expected Outcomes Short Term: Able to use RPE daily in rehab to express subjective intensity level;Long Term:  Able to use RPE to guide intensity level when exercising independently       Able to  understand and use Dyspnea scale Yes       Intervention Provide education and explanation on how to use Dyspnea scale       Expected Outcomes Short Term: Able to use Dyspnea scale daily in rehab to express subjective sense of shortness of breath during exertion;Long Term: Able  to use Dyspnea scale to guide intensity level when exercising independently       Knowledge and understanding of Target Heart Rate Range (THRR) Yes       Intervention Provide education and explanation of THRR including how the numbers were predicted and where they are located for reference       Expected Outcomes Short Term: Able to state/look up THRR;Short Term: Able to use daily as guideline for intensity in rehab;Long Term: Able to use THRR to govern intensity when exercising independently       Able to check pulse independently Yes       Intervention Provide education and demonstration on how to check pulse in carotid and radial arteries.;Review the importance of being able to check your own pulse for safety during independent exercise       Expected Outcomes Short Term: Able to explain why pulse checking is important during independent exercise;Long Term: Able to check pulse independently and accurately       Understanding of Exercise Prescription Yes       Intervention Provide education, explanation, and written materials on patient's individual exercise prescription       Expected Outcomes Short Term: Able to explain program exercise prescription;Long Term: Able to explain home exercise prescription to exercise independently                Exercise Goals Re-Evaluation :  Exercise Goals Re-Evaluation     Row Name 07/17/21 0755 07/22/21 0814 08/05/21 1721 08/21/21 0819       Exercise Goal Re-Evaluation   Exercise Goals Review Increase Physical Activity;Able to understand and use rate of perceived exertion (RPE) scale;Knowledge and understanding of Target Heart Rate Range (THRR);Understanding of Exercise  Prescription;Able to understand and use Dyspnea scale;Increase Strength and Stamina;Able to check pulse independently Increase Physical Activity;Able to understand and use rate of perceived exertion (RPE) scale;Knowledge and understanding of Target Heart Rate Range (THRR);Understanding of Exercise Prescription;Able to understand and use Dyspnea scale;Increase Strength and Stamina;Able to check pulse independently Increase Physical Activity;Increase Strength and Stamina Increase Physical Activity;Increase Strength and Stamina    Comments Reviewed RPE and dyspnea scales, THR and program prescription with pt today.  Pt voiced understanding and was given a copy of goals to take home. Reviewed home exercise with pt today.  Pt plans to use RB at home for exercise.  Reviewed THR, pulse, RPE, sign and symptoms, pulse oximetery and when to call 911 or MD.  Also discussed weather considerations and indoor options.  Pt voiced understanding. Micheal Likens is doing well in rehab. He has made significant improvement by increasing to a speed of 2.6/ 1.5% incline as well as level 5 on the T4 Nustep. He also increased to 4 lbs for his handweights. Will continue to monitor. Micheal Likens is doing well in rehab.  He is not doing too much at home for exercise as he feels lazy.  He does have a recumbent bike at home.  He will also walk around neighborhood on occasion. He is starting to feel stronger.    Expected Outcomes Short: Use RPE daily to regulate intensity. Long: Follow program prescription in THR. Short: start exercising at home 1-2 days per week on off days of class. Long: Become independent with exercise routine. Short: Continue working up loads on the treadmill Long: Continue to increase overall MET level Short: Add in more exercise at home again Long: Conitnue to improve stamina.  Discharge Exercise Prescription (Final Exercise Prescription Changes):  Exercise Prescription Changes - 08/05/21 1700       Response to  Exercise   Blood Pressure (Admit) 118/68    Blood Pressure (Exercise) 118/84    Blood Pressure (Exit) 102/64    Heart Rate (Admit) 67 bpm    Heart Rate (Exercise) 111 bpm    Heart Rate (Exit) 81 bpm    Rating of Perceived Exertion (Exercise) 13    Symptoms none    Duration Progress to 30 minutes of  aerobic without signs/symptoms of physical distress    Intensity THRR unchanged      Progression   Progression Continue to progress workloads to maintain intensity without signs/symptoms of physical distress.    Average METs 3.4      Resistance Training   Training Prescription Yes    Weight 4 lb    Reps 10-15      Interval Training   Interval Training No      Treadmill   MPH 2.6    Grade 1.5    Minutes 15    METs 3.53      NuStep   Level 5    Minutes 15    METs 3.3      REL-XR   Level 4    Minutes 15    METs 3.43      Oxygen   Maintain Oxygen Saturation 88% or higher             Nutrition:  Target Goals: Understanding of nutrition guidelines, daily intake of sodium <1533m, cholesterol <2071m calories 30% from fat and 7% or less from saturated fats, daily to have 5 or more servings of fruits and vegetables.  Education: All About Nutrition: -Group instruction provided by verbal, written material, interactive activities, discussions, models, and posters to present general guidelines for heart healthy nutrition including fat, fiber, MyPlate, the role of sodium in heart healthy nutrition, utilization of the nutrition label, and utilization of this knowledge for meal planning. Follow up email sent as well. Written material given at graduation. Flow[facility]  Education need identified 07/10/21  Date 08/07/21  Educator MCAddisonInstruction Review Code 1- Verbalizes Understanding       Biometrics:  Pre Biometrics - 07/10/21 0931       Pre Biometrics   Height 6' (1.829 m)    Weight 216 lb (98 kg)     BMI (Calculated) 29.29    Single Leg Stand 11.25 seconds              Nutrition Therapy Plan and Nutrition Goals:  Nutrition Therapy & Goals - 07/22/21 1558       Nutrition Therapy   Diet Heart healthy, low Na    Drug/Food Interactions Statins/Certain Fruits    Protein (specify units) 80g    Fiber 30 grams    Whole Grain Foods 3 servings    Saturated Fats 12 max. grams    Fruits and Vegetables 8 servings/day    Sodium 2 grams      Personal Nutrition Goals   Nutrition Goal ST: practice reading food labels, limit Na with cooking, eat the rainbow of fruits and vegetables in a week LT: eat 8 fruits/vegetables per day, limit Na < 2g/day, become proficient in label reading    Comments [age].o. M admitted to rehab s/p CABG x3. Presenting with HTN, Afib, asthma, HLD. Relevant medications include crestor, MVI. PYP 49.  Vegetables & Fruits 7/12. Breads, Grains & Cereals 6/12. Red & Processed Meat 4/12. Poultry 0/2. Fish & Shellfish 0/4. Beans, Nuts & Seeds 2/4. Milk & Dairy Foods 2/6. Toppings, Oils, Seasonings & Salt 13/20. Sweets, Snacks & Restaurant Food 7/14. Beverages 8/10. He and his wife feels like his biggest barrier is exploring. He feels he should eat more vegetables. He enjoys fruit. His wife and him have an airfryer instead of frying foods. B: cereal (special K or cheerios) L: cheese and crackers or banana D: they hamburgers monday, pork chops and green beans or mashed potatoes, spaghetti. He will eat potatoes, broccoli, green beans, green salad, green cabbage, blueberries, strawberries in the summer, tomatoes, oranges (OJ), bell peppers. Discussed heart healthy eating.      Intervention Plan   Intervention Prescribe, educate and counsel regarding individualized specific dietary modifications aiming towards targeted core components such as weight, hypertension, lipid management, diabetes, heart failure and other comorbidities.;Nutrition handout(s) given to patient.    Expected Outcomes  Short Term Goal: Understand basic principles of dietary content, such as calories, fat, sodium, cholesterol and nutrients.;Short Term Goal: A plan has been developed with personal nutrition goals set during dietitian appointment.;Long Term Goal: Adherence to prescribed nutrition plan.             Nutrition Assessments:  MEDIFICTS Score Key: ?70 Need to make dietary changes  40-70 Heart Healthy Diet ? 40 Therapeutic Level Cholesterol Diet  Flowsheet Row Cardiac Rehab from 07/10/2021 in [facility] Cardiac and Pulmonary Rehab  Picture Your Plate Total Score on Admission 49      Picture Your Plate Scores: <61 Unhealthy dietary pattern with much room for improvement. 41-50 Dietary pattern unlikely to meet recommendations for good health and room for improvement. 51-60 More healthful dietary pattern, with some room for improvement.  >60 Healthy dietary pattern, although there may be some specific behaviors that could be improved.    Nutrition Goals Re-Evaluation:  Nutrition Goals Re-Evaluation     Row Name 07/22/21 0754 08/21/21 0826           Goals   Nutrition Goal -- ST: practice reading food labels, limit Na with cooking, eat the rainbow of fruits and vegetables in a week LT: eat 8 fruits/vegetables per day, limit Na < 2g/day, become proficient in label reading      Comment Patient has not yet met with program dietician for a one on one meeting. Micheal Likens is doing well in rehab.  He is not reading his labels well.  They are getting in more rainbow and fruits and vegetables.  They are also limting his salt intake.  They are eating out less and trying to cut back on junk food overall.      Expected Outcome Short: Meet with program dietician to establish speicific dietary goals. Long: Maintian heart healthy diet. Short: Continue to work on reading food labels Long: Continue to eat a better rainbow.               Nutrition Goals Discharge (Final Nutrition Goals Re-Evaluation):  Nutrition  Goals Re-Evaluation - 08/21/21 6837       Goals   Nutrition Goal ST: practice reading food labels, limit Na with cooking, eat the rainbow of fruits and vegetables in a week LT: eat 8 fruits/vegetables per day, limit Na < 2g/day, become proficient in label reading    Comment Micheal Likens is doing well in rehab.  He is not reading his labels well.  They are getting in more rainbow  and fruits and vegetables.  They are also limting his salt intake.  They are eating out less and trying to cut back on junk food overall.    Expected Outcome Short: Continue to work on reading food labels Long: Continue to eat a better rainbow.             Psychosocial: Target Goals: Acknowledge presence or absence of significant depression and/or stress, maximize coping skills, provide positive support system. Participant is able to verbalize types and ability to use techniques and skills needed for reducing stress and depression.   Education: Stress, Anxiety, and Depression - Group verbal and visual presentation to define topics covered.  Reviews how body is impacted by stress, anxiety, and depression.  Also discusses healthy ways to reduce stress and to treat/manage anxiety and depression.  Written material given at graduation. Flowsheet Row Cardiac Rehab from 08/28/2021 in [facility] Cardiac and Pulmonary Rehab  Date 08/28/21  Educator [facility]  Instruction Review Code 1- [employer] Understanding       Education: Sleep Hygiene -Provides group verbal and written instruction about how sleep can affect your health.  Define sleep hygiene, discuss sleep cycles and impact of sleep habits. Review good sleep hygiene tips.    Initial Review & Psychosocial Screening:  Initial Psych Review & Screening - 06/19/21 1405       Initial Review   Current issues with Current Sleep Concerns      Family Dynamics   Good Support System? Yes   wife     Barriers   Psychosocial barriers to participate in program There are no identifiable  barriers or psychosocial needs.;The patient should benefit from training in stress management and relaxation.      Screening Interventions   Interventions Encouraged to exercise;Provide feedback about the scores to participant;To provide support and resources with identified psychosocial needs    Expected Outcomes Short Term goal: Utilizing psychosocial counselor, staff and physician to assist with identification of specific Stressors or current issues interfering with healing process. Setting desired goal for each stressor or current issue identified.;Long Term Goal: Stressors or current issues are controlled or eliminated.;Short Term goal: Identification and review with participant of any Quality of Life or Depression concerns found by scoring the questionnaire.;Long Term goal: The participant improves quality of Life and PHQ9 Scores as seen by post scores and/or verbalization of changes             Quality of Life Scores:   Quality of Life - 07/10/21 0935       Quality of Life   Select Quality of Life      Quality of Life Scores   Health/Function Pre 23.07 %    Socioeconomic Pre 26.25 %    Psych/Spiritual Pre 25.93 %    Family Pre 28.8 %    GLOBAL Pre 25.19 %            Scores of 19 and below usually indicate a poorer quality of life in these areas.  A difference of  2-3 points is a clinically meaningful difference.  A difference of 2-3 points in the total score of the Quality of Life Index has been associated with significant improvement in overall quality of life, self-image, physical symptoms, and general health in studies assessing change in quality of life.  PHQ-9: Recent Review Flowsheet Data     Depression screen [facility] 2/9 07/10/2021   Decreased Interest 1   Down, Depressed, Hopeless 0   PHQ - 2 Score 1  Altered sleeping 1   Tired, decreased energy 1   Change in appetite 1   Feeling bad or failure about yourself  0   Trouble concentrating 0   Moving slowly or  fidgety/restless 0   Suicidal thoughts 0   PHQ-9 Score 4   Difficult doing work/chores Not difficult at all      Interpretation of Total Score  Total Score Depression Severity:  1-4 = Minimal depression, 5-9 = Mild depression, 10-14 = Moderate depression, 15-19 = Moderately severe depression, 20-27 = Severe depression   Psychosocial Evaluation and Intervention:  Psychosocial Evaluation - 06/19/21 1419       Psychosocial Evaluation & Interventions   Interventions Encouraged to exercise with the program and follow exercise prescription    Comments 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 His biggest concern is some nerve damage during surgery that is causing numbness in his left ring and pinky fever. This is causing sleep issues. The MD gave him gabapentin to try but unfortunately that medicine is causing some balance concerns as well. He is giving it a try as he is tired of the dull ache. His wife is very supportive of him and has been a great help during his recovery. He is still sleeping in the guest bedroom since it is a lower bed and easier to get out of. He is happily retired. He is ready to get back into his normal routine and is ready to get started in the program    Expected Outcomes Short: attend cardiac rehab for education and exercise. Long; Develop and maintain positive self care habits    Continue Psychosocial Services  Follow up required by staff             Psychosocial Re-Evaluation:  Psychosocial Re-Evaluation     [place] Name 07/22/21 [phone] 08/21/21 0865           Psychosocial Re-Evaluation   Current issues with Current Sleep Concerns Current Stress Concerns;Current Sleep Concerns      Comments No new stress, sleep, or mental health concerns reported Micheal Likens is doing well in rehab.  He is feeling good mentally and denies any major stressors currently.  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  He tries to not let any thing  get to him too much.  He is still waking in the middle of the night for about an hour, but just sleeps in a little later in morning.  This was all since surgery.      Expected Outcomes Short: establish a healthy sleep routine. Long: maintain good mental health habits and stress managment. Short: Continue to work on getting improved sleep Long: Continue to stay positive.      Interventions Encouraged to attend Cardiac Rehabilitation for the exercise Encouraged to attend Cardiac Rehabilitation for the exercise      Continue Psychosocial Services  Follow up required by staff Follow up required by staff               Psychosocial Discharge (Final Psychosocial Re-Evaluation):  Psychosocial Re-Evaluation - 08/21/21 0823       Psychosocial Re-Evaluation   Current issues with Current Stress Concerns;Current Sleep Concerns    Comments Micheal Likens is doing well in rehab.  He is feeling good mentally and denies any major stressors currently.  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  He tries to not let any  thing get to him too much.  He is still waking in the middle of the night for about an hour, but just sleeps in a little later in morning.  This was all since surgery.    Expected Outcomes Short: Continue to work on getting improved sleep Long: Continue to stay positive.    Interventions Encouraged to attend Cardiac Rehabilitation for the exercise    Continue Psychosocial Services  Follow up required by staff             Vocational Rehabilitation: Provide vocational rehab assistance to qualifying candidates.   Vocational Rehab Evaluation & Intervention:  Vocational Rehab - 06/19/21 1408       Initial Vocational Rehab Evaluation & Intervention   Assessment shows need for Vocational Rehabilitation No             Education: Education Goals: Education classes will be provided on a variety of topics geared toward better understanding of heart health and  risk factor modification. Participant will state understanding/return demonstration of topics presented as noted by education test scores.  Learning Barriers/Preferences:  Learning Barriers/Preferences - 06/19/21 1408       Learning Barriers/Preferences   Learning Barriers None    Learning Preferences None             General Cardiac Education Topics:  AED/CPR: - Group verbal and written instruction with the use of models to demonstrate the basic use of the AED with the basic ABC's of resuscitation.   Anatomy and Cardiac Procedures: - Group verbal and visual presentation and models provide information about basic cardiac anatomy and function. Reviews the testing methods done to diagnose heart disease and the outcomes of the test results. Describes the treatment choices: Medical Management, Angioplasty, or Coronary Bypass Surgery for treating various heart conditions including Myocardial Infarction, Angina, Valve Disease, and Cardiac Arrhythmias.  Written material given at graduation. Flowsheet Row Cardiac Rehab from 08/28/2021 in [facility] Cardiac and Pulmonary Rehab  Date 07/17/21  Educator SB  Instruction Review Code 1- Verbalizes Understanding       Medication Safety: - Group verbal and visual instruction to review commonly prescribed medications for heart and lung disease. Reviews the medication, class of the drug, and side effects. Includes the steps to properly store meds and maintain the prescription regimen.  Written material given at graduation. Flowsheet Row Cardiac Rehab from 08/28/2021 in [facility] Cardiac and Pulmonary Rehab  Date 07/31/21  Educator SB  Instruction Review Code 1- Verbalizes Understanding       Intimacy: - Group verbal instruction through game format to discuss how heart and lung disease can affect sexual intimacy. Written material given at graduation..   Know Your Numbers and Heart Failure: - Group verbal and visual instruction to discuss disease risk  factors for cardiac and pulmonary disease and treatment options.  Reviews associated critical values for Overweight/Obesity, Hypertension, Cholesterol, and Diabetes.  Discusses basics of heart failure: signs/symptoms and treatments.  Introduces Heart Failure Zone chart for action plan for heart failure.  Written material given at graduation.   Infection Prevention: - Provides verbal and written material to individual with discussion of infection control including proper hand washing and proper equipment cleaning during exercise session. Flowsheet Row Cardiac Rehab from 08/28/2021 in [facility] Cardiac and Pulmonary Rehab  Date 07/10/21  Educator AS  Instruction Review Code 1- Verbalizes Understanding       Falls Prevention: - Provides verbal and written material to individual with discussion of falls prevention and safety. Flowsheet Row  Cardiac Rehab from 08/28/2021 in [facility] Cardiac and Pulmonary Rehab  Date 07/10/21  Educator AS  Instruction Review Code 1- Verbalizes Understanding       Other: -Provides group and verbal instruction on various topics (see comments)   Knowledge Questionnaire Score:  Knowledge Questionnaire Score - 07/10/21 0933       Knowledge Questionnaire Score   Pre Score 21/26             Core Components/Risk Factors/Patient Goals at Admission:  Personal Goals and Risk Factors at Admission - 07/10/21 0941       Core Components/Risk Factors/Patient Goals on Admission   Number of packs per day 0   quit 05/29/21 - vaping quit cigarettes 6/22   Intervention Assist the participant in steps to quit. Provide individualized education and counseling about committing to Tobacco Cessation, relapse prevention, and pharmacological support that can be provided by physician.;Advice worker, assist with locating and accessing local/national Quit Smoking programs, and support quit date choice.    Expected Outcomes Short Term: Will quit all tobacco product use,  adhering to prevention of relapse plan.;Long Term: Complete abstinence from all tobacco products for at least 12 months from quit date.    Hypertension Yes    Intervention Provide education on lifestyle modifcations including regular physical activity/exercise, weight management, moderate sodium restriction and increased consumption of fresh fruit, vegetables, and low fat dairy, alcohol moderation, and smoking cessation.;Monitor prescription use compliance.    Expected Outcomes Short Term: Continued assessment and intervention until BP is < 140/83m HG in hypertensive participants. < 130/871mHG in hypertensive participants with diabetes, heart failure or chronic kidney disease.;Long Term: Maintenance of blood pressure at goal levels.    Lipids Yes    Intervention Provide education and support for participant on nutrition & aerobic/resistive exercise along with prescribed medications to achieve LDL <70102mHDL >58m50m  Expected Outcomes Short Term: Participant states understanding of desired cholesterol values and is compliant with medications prescribed. Participant is following exercise prescription and nutrition guidelines.;Long Term: Cholesterol controlled with medications as prescribed, with individualized exercise RX and with personalized nutrition plan. Value goals: LDL < 70mg74mL > 40 mg.             Education:Diabetes - Individual verbal and written instruction to review signs/symptoms of diabetes, desired ranges of glucose level fasting, after meals and with exercise. Acknowledge that pre and post exercise glucose checks will be done for 3 sessions at entry of program.   Core Components/Risk Factors/Patient Goals Review:   Goals and Risk Factor Review     Row Name 07/10/21 0941 07/22/21 0806 08/21/21 0832         Core Components/Risk Factors/Patient Goals Review   Personal Goals Review Tobacco Cessation Tobacco Cessation;Lipids;Hypertension Tobacco  Cessation;Lipids;Hypertension;Weight Management/Obesity     Review Gage Micheal Likensrecently quit tobacco use within the last 6 months. Intervention for relapse prevention was provided at the initial medical review. He was encouraged to continue to with tobacco cessation and was provided information on relapse prevention. Patient received information about combination therapy, tobacco cessation classes, quit line, and quit smoking apps in case of a relapse. Patient demonstrated understanding of this material.Staff will continue to provide encouragement and follow up with the patient throughout the program. Patient is taking all medicaitons a prescribed to help control risk factors. He continues to not use tobacco products. Gage Micheal Likensoing well in rehab.  His weight is creeping up but he is trying to get it back  off. He is doing well with his tobacco cessation. He does have cravings and gave in to a craving last week and had one, but that has been it.  He did note that during that week he had been feeling down and fatigued more than normal and his wife could tell.  Otherwise, he has been good.  His pressures are good in class but he does not check it at home unless he does not feel good.     Expected Outcomes Continued cessation Short: maintian tobacco free lifestyle. Long: become independent with exercise and continue to take medications to control cardiac risk factors. Short: Continue to work on weight loss and cessation Long: Continue to montior risk factors.              Core Components/Risk Factors/Patient Goals at Discharge (Final Review):   Goals and Risk Factor Review - 08/21/21 0832       Core Components/Risk Factors/Patient Goals Review   Personal Goals Review Tobacco Cessation;Lipids;Hypertension;Weight Management/Obesity    Review Micheal Likens is doing well in rehab.  His weight is creeping up but he is trying to get it back off. He is doing well with his tobacco cessation. He does have cravings and gave in  to a craving last week and had one, but that has been it.  He did note that during that week he had been feeling down and fatigued more than normal and his wife could tell.  Otherwise, he has been good.  His pressures are good in class but he does not check it at home unless he does not feel good.    Expected Outcomes Short: Continue to work on weight loss and cessation Long: Continue to montior risk factors.             ITP Comments:  ITP Comments     Row Name 06/19/21 1415 07/10/21 0938 07/17/21 0754 07/22/21 1640 08/07/21 0831   ITP Comments Initial telephone orientation completed. Diagnosis can be found in [facility] 11/15. EP orientation scheduled for Wednesday 12/28 at 8am. Completed 6MWT and gym orientation. Initial ITP created and sent for review to Dr. Emily Filbert, Medical Director. First full day of exercise!  Patient was oriented to gym and equipment including functions, settings, policies, and procedures.  Patient's individual exercise prescription and treatment plan were reviewed.  All starting workloads were established based on the results of the 6 minute walk test done at initial orientation visit.  The plan for exercise progression was also introduced and progression will be customized based on patient's performance and goals. Completed initial RD consultation 30 Day review completed. Medical Director ITP review done, changes made as directed, and signed approval by Medical Director.    [place] Name 09/04/21 0755           ITP Comments 30 Day review completed. Medical Director ITP review done, changes made as directed, and signed approval by Medical Director.                Comments:

## 2021-09-04 NOTE — Progress Notes (Signed)
Daily Session Note  Patient Details  Name: Christian Pena MRN: [id number] Date of Birth: Aug 12, 1950 Referring Provider:   Flowsheet Row Cardiac Rehab from 07/10/2021 in [facility] Cardiac and Pulmonary Rehab  Referring Provider [name]       Encounter Date: 09/04/2021  Check In:  Session Check In - 09/04/21 0801       Check-In   Supervising physician immediately available to respond to emergencies See telemetry face sheet for immediately available ER MD    Location [facility]-Cardiac & Pulmonary Rehab    Staff Present Birdie Sons, MPA, [place]veria Rising, BA, ACSM CEP, Exercise Physiologist;Joseph Tessie Fass, [place]    Virtual Visit No    Medication changes reported     No    Fall or balance concerns reported    No    Tobacco Cessation No Change    Warm-up and Cool-down Performed on first and last piece of equipment    Resistance Training Performed Yes    VAD Patient? No    PAD/SET Patient? No      Pain Assessment   Currently in Pain? No/denies                Social History   Tobacco Use  Smoking Status Former   Types: Cigarettes   Quit date: 05/2021   Years since quitting: 0.3  Smokeless Tobacco Never    Goals Met:  Independence with exercise equipment Exercise tolerated well No report of concerns or symptoms today Strength training completed today  Goals Unmet:  Not Applicable  Comments: Pt able to follow exercise prescription today without complaint.  Will continue to monitor for progression.    Dr. Emily Filbert is Medical Director for [place].  Dr. Ottie Glazier is Medical Director for [facility] Pulmonary Rehabilitation.

## 2021-09-06 ENCOUNTER — Encounter: Payer: Medicare HMO | Admitting: *Deleted

## 2021-09-06 ENCOUNTER — Other Ambulatory Visit: Payer: Self-pay

## 2021-09-06 DIAGNOSIS — Z951 Presence of aortocoronary bypass graft: Secondary | ICD-10-CM | POA: Diagnosis not present

## 2021-09-06 NOTE — Progress Notes (Signed)
Daily Session Note  Patient Details  Name: Christian Pena MRN: [id number] Date of Birth: Oct 15, 1950 Referring Provider:   Flowsheet Row Cardiac Rehab from 07/10/2021 in [facility] Cardiac and Pulmonary Rehab  Referring Provider [name]       Encounter Date: 09/06/2021  Check In:  Session Check In - 09/06/21 0749       Check-In   Supervising physician immediately available to respond to emergencies See telemetry face sheet for immediately available ER MD    Location [facility]-Cardiac & Pulmonary Rehab    Staff Present Renita Papa, RN BSN;Joseph Tessie Fass, RCP,RRT,BSRT;Melissa [place], [place], LDN    Virtual Visit No    Medication changes reported     No    Fall or balance concerns reported    No    Tobacco Cessation Use Increase    Current number of cigarettes/nicotine per day     2    Warm-up and Cool-down Performed on first and last piece of equipment    Resistance Training Performed Yes    VAD Patient? No    PAD/SET Patient? No      Pain Assessment   Currently in Pain? No/denies                Social History   Tobacco Use  Smoking Status Former   Types: Cigarettes   Quit date: 05/2021   Years since quitting: 0.3  Smokeless Tobacco Never    Goals Met:  Independence with exercise equipment Exercise tolerated well No report of concerns or symptoms today Strength training completed today  Goals Unmet:  Not Applicable  Comments: Pt able to follow exercise prescription today without complaint.  Will continue to monitor for progression.    Dr. Emily Filbert is Medical Director for [place].  Dr. Ottie Glazier is Medical Director for [facility] Pulmonary Rehabilitation.

## 2021-09-09 ENCOUNTER — Encounter: Payer: Medicare HMO | Admitting: *Deleted

## 2021-09-09 ENCOUNTER — Other Ambulatory Visit: Payer: Self-pay

## 2021-09-09 DIAGNOSIS — Z951 Presence of aortocoronary bypass graft: Secondary | ICD-10-CM | POA: Diagnosis not present

## 2021-09-09 NOTE — Progress Notes (Signed)
Daily Session Note  Patient Details  Name: Christian Pena MRN: [id number] Date of Birth: 1950/08/01 Referring Provider:   Flowsheet Row Cardiac Rehab from 07/10/2021 in [facility] Cardiac and Pulmonary Rehab  Referring Provider [name]       Encounter Date: 09/09/2021  Check In:  Session Check In - 09/09/21 0844       Check-In   Supervising physician immediately available to respond to emergencies See telemetry face sheet for immediately available ER MD    Location [facility]-Cardiac & Pulmonary Rehab    Staff Present Heath Lark, RN, BSN, CCRP;Joseph Dustin, RCP,RRT,BSRT;Kelly [place], [place], ACSM CEP, Exercise Physiologist    Virtual Visit No    Medication changes reported     No    Fall or balance concerns reported    No    Warm-up and Cool-down Performed on first and last piece of equipment    Resistance Training Performed Yes    VAD Patient? No    PAD/SET Patient? No      Pain Assessment   Currently in Pain? No/denies                Social History   Tobacco Use  Smoking Status Former   Types: Cigarettes   Quit date: 05/2021   Years since quitting: 0.3  Smokeless Tobacco Never    Goals Met:  Independence with exercise equipment Exercise tolerated well No report of concerns or symptoms today  Goals Unmet:  Not Applicable  Comments: Pt able to follow exercise prescription today without complaint.  Will continue to monitor for progression.    Dr. Emily Filbert is Medical Director for [place].  Dr. Ottie Glazier is Medical Director for [facility] Pulmonary Rehabilitation.

## 2021-09-11 ENCOUNTER — Encounter: Payer: Medicare HMO | Attending: Internal Medicine

## 2021-09-11 ENCOUNTER — Other Ambulatory Visit: Payer: Self-pay

## 2021-09-11 DIAGNOSIS — Z951 Presence of aortocoronary bypass graft: Secondary | ICD-10-CM | POA: Diagnosis present

## 2021-09-11 NOTE — Progress Notes (Signed)
Daily Session Note ? ?Patient Details  ?Name: SHAHEIM MAHAR ?MRN: [id number] ?Date of Birth: August 27, 1950 ?Referring Provider:   ?Flowsheet Row Cardiac Rehab from 07/10/2021 in [facility] Cardiac and Pulmonary Rehab  ?Referring Provider [name]  ? ?  ? ? ?Encounter Date: 09/11/2021 ? ?Check In: ? Session Check In - 09/11/21 0753   ? ?  ? Check-In  ? Supervising physician immediately available to respond to emergencies See telemetry face sheet for immediately available ER MD   ? Location [facility]-Cardiac & Pulmonary Rehab   ? Staff Present Birdie Sons, MPA, RN;Joseph [place], RCP,RRT,BSRT;Amanda Sommer, BA, ACSM CEP, Exercise Physiologist   ? Virtual Visit No   ? Medication changes reported     No   ? Fall or balance concerns reported    No   ? Tobacco Cessation No Change   ? Warm-up and Cool-down Performed on first and last piece of equipment   ? Resistance Training Performed Yes   ? VAD Patient? No   ? PAD/SET Patient? No   ?  ? Pain Assessment  ? Currently in Pain? No/denies   ? ?  ?  ? ?  ? ? ? ? ? ?Social History  ? ?Tobacco Use  ?Smoking Status Former  ? Types: Cigarettes  ? Quit date: 05/2021  ? Years since quitting: 0.3  ?Smokeless Tobacco Never  ? ? ?Goals Met:  ?Independence with exercise equipment ?Exercise tolerated well ?No report of concerns or symptoms today ?Strength training completed today ? ?Goals Unmet:  ?Not Applicable ? ?Comments: Pt able to follow exercise prescription today without complaint.  Will continue to monitor for progression. ? ? ? ?Dr. Emily Filbert is Medical Director for [place].  ?Dr. Ottie Glazier is Medical Director for [facility] Pulmonary Rehabilitation. ?

## 2021-09-12 NOTE — Patient Instructions (Signed)
Discharge Patient Instructions  Patient Details  Name: Christian Pena MRN: [id number] Date of Birth: 04/27/1951 Referring Provider:  Corey Skains, MD   Number of Visits: 36  Reason for Discharge:  Patient reached a stable level of exercise. Patient independent in their exercise. Patient has met program and personal goals.  Smoking History:  Social History   Tobacco Use  Smoking Status Former   Types: Cigarettes   Quit date: 05/2021   Years since quitting: 0.3  Smokeless Tobacco Never    Diagnosis:  S/P CABG x 3  Initial Exercise Prescription:  Initial Exercise Prescription - 07/10/21 0900       Date of Initial Exercise RX and Referring Provider   Date 07/10/21    Referring Provider [name]      Oxygen   Maintain Oxygen Saturation 88% or higher      Treadmill   MPH 2.3    Grade 0.5    Minutes 15    METs 2.9      Recumbant Bike   Level 3    RPM 60    Minutes 15    METs 2.9      REL-XR   Level 3    Speed 50    Minutes 15    METs 2.9      T5 Nustep   Level 2    SPM 80    Minutes 15    METs 2.9      Prescription Details   Frequency (times per week) 3    Duration Progress to 30 minutes of continuous aerobic without signs/symptoms of physical distress      Intensity   THRR 40-80% of Max Heartrate 96-133    Ratings of Perceived Exertion 11-13    Perceived Dyspnea 0-4      Resistance Training   Training Prescription Yes    Weight 3 lb    Reps 10-15             Discharge Exercise Prescription (Final Exercise Prescription Changes):  Exercise Prescription Changes - 09/04/21 0800       Response to Exercise   Blood Pressure (Admit) 124/82    Blood Pressure (Exit) 130/82    Heart Rate (Admit) 78 bpm    Heart Rate (Exercise) 105 bpm    Heart Rate (Exit) 74 bpm    Rating of Perceived Exertion (Exercise) 12    Symptoms none    Duration Continue with 30 min of aerobic exercise without signs/symptoms of physical distress.    Intensity  THRR unchanged      Progression   Progression Continue to progress workloads to maintain intensity without signs/symptoms of physical distress.    Average METs 3.83      Resistance Training   Training Prescription Yes    Weight 4 lb    Reps 10-15      Interval Training   Interval Training No      Treadmill   MPH 2.7    Grade 1.5    Minutes 15    METs 3.63      Recumbant Elliptical   Level 3.5    Minutes 15    METs 2.7      REL-XR   Level 8    Minutes 15    METs 4.3      T5 Nustep   Level 2    Minutes 15    METs 4.7      Home Exercise Plan   Plans to continue exercise  at Home (comment)   use RB and walk at home on off days of cardiac rehab.   Frequency Add 2 additional days to program exercise sessions.    Initial Home Exercises Provided 07/22/21      Oxygen   Maintain Oxygen Saturation 88% or higher             Functional Capacity:  6 Minute Walk     Row Name 07/10/21 0915 09/09/21 0847       6 Minute Walk   Phase Initial Discharge    Distance 1365 feet 1510 feet    Distance % Change -- 10.62 %    Distance Feet Change -- 145 ft    Walk Time 6 minutes 6 minutes    # of Rest Breaks 0 0    MPH 2.6 2.86    METS 2.9 3.19    RPE 7 11    Perceived Dyspnea  0 --    VO2 Peak 10.13 11.16    Symptoms Yes (comment) No    Comments little dizziness at beginning that went away --    Resting HR 59 bpm 79 bpm    Resting BP 114/60 112/62    Resting Oxygen Saturation  100 % --    Exercise Oxygen Saturation  during 6 min walk 98 % --    Max Ex. HR 90 bpm 94 bpm    Max Ex. BP 118/72 124/64    2 Minute Post BP 108/68 --               Nutrition & Weight - Outcomes:  Pre Biometrics - 07/10/21 0931       Pre Biometrics   Height 6' (1.829 m)    Weight 216 lb (98 kg)    BMI (Calculated) 29.29    Single Leg Stand 11.25 seconds              Nutrition:  Nutrition Therapy & Goals - 07/22/21 1558       Nutrition Therapy   Diet Heart healthy,  low Na    Drug/Food Interactions Statins/Certain Fruits    Protein (specify units) 80g    Fiber 30 grams    Whole Grain Foods 3 servings    Saturated Fats 12 max. grams    Fruits and Vegetables 8 servings/day    Sodium 2 grams      Personal Nutrition Goals   Nutrition Goal ST: practice reading food labels, limit Na with cooking, eat the rainbow of fruits and vegetables in a week LT: eat 8 fruits/vegetables per day, limit Na < 2g/day, become proficient in label reading    Comments 71 y.o. M admitted to rehab s/p CABG x3. Presenting with HTN, Afib, asthma, HLD. Relevant medications include crestor, MVI. PYP 49. Vegetables & Fruits 7/12. Breads, Grains & Cereals 6/12. Red & Processed Meat 4/12. Poultry 0/2. Fish & Shellfish 0/4. Beans, Nuts & Seeds 2/4. Milk & Dairy Foods 2/6. Toppings, Oils, Seasonings & Salt 13/20. Sweets, Snacks & Restaurant Food 7/14. Beverages 8/10. He and his wife feels like his biggest barrier is exploring. He feels he should eat more vegetables. He enjoys fruit. His wife and him have an airfryer instead of frying foods. B: cereal (special K or cheerios) L: cheese and crackers or banana D: they hamburgers monday, pork chops and green beans or mashed potatoes, spaghetti. He will eat potatoes, broccoli, green beans, green salad, green cabbage, blueberries, strawberries in the summer, tomatoes, oranges (OJ), bell peppers. Discussed heart  healthy eating.      Intervention Plan   Intervention Prescribe, educate and counsel regarding individualized specific dietary modifications aiming towards targeted core components such as weight, hypertension, lipid management, diabetes, heart failure and other comorbidities.;Nutrition handout(s) given to patient.    Expected Outcomes Short Term Goal: Understand basic principles of dietary content, such as calories, fat, sodium, cholesterol and nutrients.;Short Term Goal: A plan has been developed with personal nutrition goals set during dietitian  appointment.;Long Term Goal: Adherence to prescribed nutrition plan.              Goals reviewed with patient; copy given to patient.

## 2021-09-13 ENCOUNTER — Other Ambulatory Visit: Payer: Self-pay

## 2021-09-13 ENCOUNTER — Encounter: Payer: Medicare HMO | Admitting: *Deleted

## 2021-09-13 DIAGNOSIS — Z951 Presence of aortocoronary bypass graft: Secondary | ICD-10-CM | POA: Diagnosis not present

## 2021-09-13 NOTE — Progress Notes (Signed)
Daily Session Note ? ?Patient Details  ?Name: Christian Pena ?MRN: [id number] ?Date of Birth: 11/30/1950 ?Referring Provider:   ?Flowsheet Row Cardiac Rehab from 07/10/2021 in [facility] Card[facility]  ?Referring Provider [name]  ? ?  ? ? ?Encounter Date: 09/13/2021 ? ?Check In: ? Session Check In - 09/13/21 0804   ? ?  ? Check-In  ? Supervising physician immediately available to respond to emergencies See telemetry face sheet for immediately available ER MD   ? Location [facility]-Cardiac & Pulmonary Rehab   ? Staff Present Heath Lark, RN, BSN, CCRP;Joseph Palmer, [place], [place], [place], [place], [facility]   ? Virtual Visit No   ? Medication changes reported     No   ? Fall or balance concerns reported    No   ? Warm-up and Cool-down Performed on first and last piece of equipment   ? Resistance Training Performed Yes   ? VAD Patient? No   ? PAD/SET Patient? No   ?  ? Pain Assessment  ? Currently in Pain? No/denies   ? ?  ?  ? ?  ? ? ? ? ? ?Social History  ? ?Tobacco Use  ?Smoking Status Former  ? Types: Cigarettes  ? Quit date: 05/2021  ? Years since quitting: 0.3  ?Smokeless Tobacco Never  ? ? ?Goals Met:  ?Independence with exercise equipment ?Exercise tolerated well ?No report of concerns or symptoms today ? ?Goals Unmet:  ?Not Applicable ? ?Comments: Pt able to follow exercise prescription today without complaint.  Will continue to monitor for progression. ? ? ? ?Dr. Emily Filbert is Medical Director for [facility].  ?Dr. Ottie Glazier is Medical Director for [facility] Pulmonary Rehabilitation. ?

## 2021-09-16 ENCOUNTER — Other Ambulatory Visit: Payer: Self-pay

## 2021-09-16 ENCOUNTER — Encounter: Payer: Medicare HMO | Admitting: *Deleted

## 2021-09-16 VITALS — Ht 72.0 in | Wt 220.0 lb

## 2021-09-16 DIAGNOSIS — Z951 Presence of aortocoronary bypass graft: Secondary | ICD-10-CM

## 2021-09-16 NOTE — Progress Notes (Signed)
Daily Session Note ? ?Patient Details  ?Name: Christian Pena ?MRN: [id number] ?Date of Birth: [birth year] ?Referring Provider:   ?Flowsheet Row Cardiac Rehab from 07/10/2021 in [facility] Cardiac and Pulmonary Rehab  ?Referring Provider [name]  ? ?  ? ? ?Encounter Date: 09/16/2021 ? ?Check In: ? Session Check In - 09/16/21 0831   ? ?  ? Check-In  ? Supervising physician immediately available to respond to emergencies See telemetry face sheet for immediately available ER MD   ? Location [facility]-Cardiac & Pulmonary Rehab   ? Staff Present Heath Lark, RN, BSN, [place]aved[place] Norman, BS, ACSM CEP, Exercise Physiologist;Joseph [place], [place]   ? Virtual Visit No   ? Medication changes reported     No   ? Fall or balance concerns reported    No   ? Tobacco Cessation No Change   ? Current number of cigarettes/nicotine per day     2   ? Warm-up and Cool-down Performed on first and last piece of equipment   ? Resistance Training Performed Yes   ? VAD Patient? No   ? PAD/SET Patient? No   ?  ? Pain Assessment  ? Currently in Pain? No/denies   ? ?  ?  ? ?  ? ? ? ? ? ?Social History  ? ?Tobacco Use  ?Smoking Status Former  ? Types: Cigarettes  ? Quit date: 05/2021  ? Years since quitting: 0.3  ?Smokeless Tobacco Never  ? ? ?Goals Met:  ?Independence with exercise equipment ?Exercise tolerated well ?No report of concerns or symptoms today ? ?Goals Unmet:  ?Not Applicable ? ?Comments: Pt able to follow exercise prescription today without complaint.  Will continue to monitor for progression. ? ? ? ?Dr. Emily Filbert is Medical Director for [facility].  ?Dr. Ottie Glazier is Medical Director for [facility] Pulmonary Rehabilitation. ?

## 2021-09-18 ENCOUNTER — Other Ambulatory Visit: Payer: Self-pay

## 2021-09-18 DIAGNOSIS — Z951 Presence of aortocoronary bypass graft: Secondary | ICD-10-CM

## 2021-09-18 NOTE — Patient Instructions (Signed)
Discharge Patient Instructions  Patient Details  Name: Christian Pena MRN: [id number] Date of Birth: 03/13/1951 Referring Provider:  Corey Skains, MD   Number of Visits: 36  Reason for Discharge:  Patient reached a stable level of exercise. Patient independent in their exercise. Patient has met program and personal goals.  Smoking History:  Social History   Tobacco Use  Smoking Status Former   Types: Cigarettes   Quit date: 05/2021   Years since quitting: 0.3  Smokeless Tobacco Never    Diagnosis:  S/P CABG x 3  Initial Exercise Prescription:  Initial Exercise Prescription - 07/10/21 0900       Date of Initial Exercise RX and Referring Provider   Date 07/10/21    Referring Provider [name]      Oxygen   Maintain Oxygen Saturation 88% or higher      Treadmill   MPH 2.3    Grade 0.5    Minutes 15    METs 2.9      Recumbant Bike   Level 3    RPM 60    Minutes 15    METs 2.9      REL-XR   Level 3    Speed 50    Minutes 15    METs 2.9      T5 Nustep   Level 2    SPM 80    Minutes 15    METs 2.9      Prescription Details   Frequency (times per week) 3    Duration Progress to 30 minutes of continuous aerobic without signs/symptoms of physical distress      Intensity   THRR 40-80% of Max Heartrate 96-133    Ratings of Perceived Exertion 11-13    Perceived Dyspnea 0-4      Resistance Training   Training Prescription Yes    Weight 3 lb    Reps 10-15             Discharge Exercise Prescription (Final Exercise Prescription Changes):  Exercise Prescription Changes - 09/16/21 1100       Response to Exercise   Blood Pressure (Admit) 132/74    Blood Pressure (Exit) 112/60    Heart Rate (Admit) 75 bpm    Heart Rate (Exercise) 102 bpm    Heart Rate (Exit) 81 bpm    Rating of Perceived Exertion (Exercise) 13    Symptoms none    Duration Continue with 30 min of aerobic exercise without signs/symptoms of physical distress.    Intensity  THRR unchanged      Progression   Progression Continue to progress workloads to maintain intensity without signs/symptoms of physical distress.    Average METs 3.9      Resistance Training   Training Prescription Yes    Weight 4 lb    Reps 10-15      Interval Training   Interval Training No      Treadmill   MPH 2.7    Grade 1.5    Minutes 15    METs 3.63      Recumbant Elliptical   Level 4    Minutes 15    METs 3.5      REL-XR   Level 8    Minutes 15    METs 5.4      Home Exercise Plan   Plans to continue exercise at Home (comment)   use RB and walk at home on off days of cardiac rehab.   Frequency Add  2 additional days to program exercise sessions.    Initial Home Exercises Provided 07/22/21      Oxygen   Maintain Oxygen Saturation 88% or higher             Functional Capacity:  6 Minute Walk     Row Name 07/10/21 0915 09/09/21 0847       6 Minute Walk   Phase Initial Discharge    Distance 1365 feet 1510 feet    Distance % Change -- 10.62 %    Distance Feet Change -- 145 ft    Walk Time 6 minutes 6 minutes    # of Rest Breaks 0 0    MPH 2.6 2.86    METS 2.9 3.19    RPE 7 11    Perceived Dyspnea  0 --    VO2 Peak 10.13 11.16    Symptoms Yes (comment) No    Comments little dizziness at beginning that went away --    Resting HR 59 bpm 79 bpm    Resting BP 114/60 112/62    Resting Oxygen Saturation  100 % --    Exercise Oxygen Saturation  during 6 min walk 98 % --    Max Ex. HR 90 bpm 94 bpm    Max Ex. BP 118/72 124/64    2 Minute Post BP 108/68 --             Quality of Life:  Quality of Life - 09/11/21 0809       Quality of Life Scores   Health/Function Pre 23.07 %    Health/Function Post 25.8 %    Health/Function % Change 11.83 %    Socioeconomic Pre 26.25 %    Socioeconomic Post 29.06 %    Socioeconomic % Change  10.7 %    Psych/Spiritual Pre 25.93 %    Psych/Spiritual Post 26.79 %    Psych/Spiritual % Change 3.32 %     Family Pre 28.8 %    Family Post 28.8 %    Family % Change 0 %    GLOBAL Pre 25.19 %    GLOBAL Post 27.17 %    GLOBAL % Change 7.86 %             Personal Goals: Goals established at orientation with interventions provided to work toward goal.  Personal Goals and Risk Factors at Admission - 07/10/21 0941       Core Components/Risk Factors/Patient Goals on Admission   Number of packs per day 0   quit 05/29/21 - vaping quit cigarettes 6/22   Intervention Assist the participant in steps to quit. Provide individualized education and counseling about committing to Tobacco Cessation, relapse prevention, and pharmacological support that can be provided by physician.;Advice worker, assist with locating and accessing local/national Quit Smoking programs, and support quit date choice.    Expected Outcomes Short Term: Will quit all tobacco product use, adhering to prevention of relapse plan.;Long Term: Complete abstinence from all tobacco products for at least 12 months from quit date.    Hypertension Yes    Intervention Provide education on lifestyle modifcations including regular physical activity/exercise, weight management, moderate sodium restriction and increased consumption of fresh fruit, vegetables, and low fat dairy, alcohol moderation, and smoking cessation.;Monitor prescription use compliance.    Expected Outcomes Short Term: Continued assessment and intervention until BP is < 140/68m HG in hypertensive participants. < 130/833mHG in hypertensive participants with diabetes, heart failure or chronic kidney disease.;Long Term:  Maintenance of blood pressure at goal levels.    Lipids Yes    Intervention Provide education and support for participant on nutrition & aerobic/resistive exercise along with prescribed medications to achieve LDL <59m, HDL >486m    Expected Outcomes Short Term: Participant states understanding of desired cholesterol values and is compliant with  medications prescribed. Participant is following exercise prescription and nutrition guidelines.;Long Term: Cholesterol controlled with medications as prescribed, with individualized exercise RX and with personalized nutrition plan. Value goals: LDL < 708mHDL > 40 mg.              Personal Goals Discharge:  Goals and Risk Factor Review - 09/06/21 0753       Core Components/Risk Factors/Patient Goals Review   Personal Goals Review Tobacco Cessation    Review GagMicheal Likens working on quitting smoking. He is planning on asking his doctor for Chantix. He is motivated to make changes    Expected Outcomes Short: ask his MD about chantix. Long: independently manage tobacco cessation             Exercise Goals and Review:  Exercise Goals     Row Name 07/10/21 0930             Exercise Goals   Increase Physical Activity Yes       Intervention Provide advice, education, support and counseling about physical activity/exercise needs.;Develop an individualized exercise prescription for aerobic and resistive training based on initial evaluation findings, risk stratification, comorbidities and participant's personal goals.       Expected Outcomes Short Term: Attend rehab on a regular basis to increase amount of physical activity.;Long Term: Add in home exercise to make exercise part of routine and to increase amount of physical activity.;Long Term: Exercising regularly at least 3-5 days a week.       Increase Strength and Stamina Yes       Intervention Provide advice, education, support and counseling about physical activity/exercise needs.;Develop an individualized exercise prescription for aerobic and resistive training based on initial evaluation findings, risk stratification, comorbidities and participant's personal goals.       Expected Outcomes Short Term: Increase workloads from initial exercise prescription for resistance, speed, and METs.;Short Term: Perform resistance training exercises  routinely during rehab and add in resistance training at home;Long Term: Improve cardiorespiratory fitness, muscular endurance and strength as measured by increased METs and functional capacity (6MWT)       Able to understand and use rate of perceived exertion (RPE) scale Yes       Intervention Provide education and explanation on how to use RPE scale       Expected Outcomes Short Term: Able to use RPE daily in rehab to express subjective intensity level;Long Term:  Able to use RPE to guide intensity level when exercising independently       Able to understand and use Dyspnea scale Yes       Intervention Provide education and explanation on how to use Dyspnea scale       Expected Outcomes Short Term: Able to use Dyspnea scale daily in rehab to express subjective sense of shortness of breath during exertion;Long Term: Able to use Dyspnea scale to guide intensity level when exercising independently       Knowledge and understanding of Target Heart Rate Range (THRR) Yes       Intervention Provide education and explanation of THRR including how the numbers were predicted and where they are located for reference  Expected Outcomes Short Term: Able to state/look up THRR;Short Term: Able to use daily as guideline for intensity in rehab;Long Term: Able to use THRR to govern intensity when exercising independently       Able to check pulse independently Yes       Intervention Provide education and demonstration on how to check pulse in carotid and radial arteries.;Review the importance of being able to check your own pulse for safety during independent exercise       Expected Outcomes Short Term: Able to explain why pulse checking is important during independent exercise;Long Term: Able to check pulse independently and accurately       Understanding of Exercise Prescription Yes       Intervention Provide education, explanation, and written materials on patient's individual exercise prescription        Expected Outcomes Short Term: Able to explain program exercise prescription;Long Term: Able to explain home exercise prescription to exercise independently                Exercise Goals Re-Evaluation:  Exercise Goals Re-Evaluation     Row Name 07/17/21 0755 07/22/21 0814 08/05/21 1721 08/21/21 0819 09/04/21 0809     Exercise Goal Re-Evaluation   Exercise Goals Review Increase Physical Activity;Able to understand and use rate of perceived exertion (RPE) scale;Knowledge and understanding of Target Heart Rate Range (THRR);Understanding of Exercise Prescription;Able to understand and use Dyspnea scale;Increase Strength and Stamina;Able to check pulse independently Increase Physical Activity;Able to understand and use rate of perceived exertion (RPE) scale;Knowledge and understanding of Target Heart Rate Range (THRR);Understanding of Exercise Prescription;Able to understand and use Dyspnea scale;Increase Strength and Stamina;Able to check pulse independently Increase Physical Activity;Increase Strength and Stamina Increase Physical Activity;Increase Strength and Stamina Increase Physical Activity;Increase Strength and Stamina;Understanding of Exercise Prescription   Comments Reviewed RPE and dyspnea scales, THR and program prescription with pt today.  Pt voiced understanding and was given a copy of goals to take home. Reviewed home exercise with pt today.  Pt plans to use RB at home for exercise.  Reviewed THR, pulse, RPE, sign and symptoms, pulse oximetery and when to call 911 or MD.  Also discussed weather considerations and indoor options.  Pt voiced understanding. Micheal Likens is doing well in rehab. He has made significant improvement by increasing to a speed of 2.6/ 1.5% incline as well as level 5 on the T4 Nustep. He also increased to 4 lbs for his handweights. Will continue to monitor. Micheal Likens is doing well in rehab.  He is not doing too much at home for exercise as he feels lazy.  He does have a recumbent  bike at home.  He will also walk around neighborhood on occasion. He is starting to feel stronger. Micheal Likens is doing well in rehab.  He is up to level 8 on the XR and 3.5 on teh recumbent elliptical.  We will continue to monitor her progress.   Expected Outcomes Short: Use RPE daily to regulate intensity. Long: Follow program prescription in THR. Short: start exercising at home 1-2 days per week on off days of class. Long: Become independent with exercise routine. Short: Continue working up loads on the treadmill Long: Continue to increase overall MET level Short: Add in more exercise at home again Long: Conitnue to improve stamina. Short: Try 5 lb hand weights and improve post 6MWT  Long: conitnue to improve stamina    Row Name 09/16/21 1131  Exercise Goal Re-Evaluation   Exercise Goals Review Increase Physical Activity;Increase Strength and Stamina;Understanding of Exercise Prescription       Comments Micheal Likens continues to do well in rehab. He finished his post 6MWT and improved by over 10%. He is now up to 5.4 METS on the XR. Will continue to monitor until he graduates this week.       Expected Outcomes Short: Graduate Long: Continue to increase overall MET level                Nutrition & Weight - Outcomes:  Pre Biometrics - 07/10/21 0931       Pre Biometrics   Height 6' (1.829 m)    Weight 216 lb (98 kg)    BMI (Calculated) 29.29    Single Leg Stand 11.25 seconds             Post Biometrics - 09/17/21 1542        Post  Biometrics   Height 6' (1.829 m)    Weight 220 lb (99.8 kg)    BMI (Calculated) 29.83             Nutrition:  Nutrition Therapy & Goals - 07/22/21 1558       Nutrition Therapy   Diet Heart healthy, low Na    Drug/Food Interactions Statins/Certain Fruits    Protein (specify units) 80g    Fiber 30 grams    Whole Grain Foods 3 servings    Saturated Fats 12 max. grams    Fruits and Vegetables 8 servings/day    Sodium 2 grams       Personal Nutrition Goals   Nutrition Goal ST: practice reading food labels, limit Na with cooking, eat the rainbow of fruits and vegetables in a week LT: eat 8 fruits/vegetables per day, limit Na < 2g/day, become proficient in label reading    Comments 71 y.o. M admitted to rehab s/p CABG x3. Presenting with HTN, Afib, asthma, HLD. Relevant medications include crestor, MVI. PYP 49. Vegetables & Fruits 7/12. Breads, Grains & Cereals 6/12. Red & Processed Meat 4/12. Poultry 0/2. Fish & Shellfish 0/4. Beans, Nuts & Seeds 2/4. Milk & Dairy Foods 2/6. Toppings, Oils, Seasonings & Salt 13/20. Sweets, Snacks & Restaurant Food 7/14. Beverages 8/10. He and his wife feels like his biggest barrier is exploring. He feels he should eat more vegetables. He enjoys fruit. His wife and him have an airfryer instead of frying foods. B: cereal (special K or cheerios) L: cheese and crackers or banana D: they hamburgers monday, pork chops and green beans or mashed potatoes, spaghetti. He will eat potatoes, broccoli, green beans, green salad, green cabbage, blueberries, strawberries in the summer, tomatoes, oranges (OJ), bell peppers. Discussed heart healthy eating.      Intervention Plan   Intervention Prescribe, educate and counsel regarding individualized specific dietary modifications aiming towards targeted core components such as weight, hypertension, lipid management, diabetes, heart failure and other comorbidities.;Nutrition handout(s) given to patient.    Expected Outcomes Short Term Goal: Understand basic principles of dietary content, such as calories, fat, sodium, cholesterol and nutrients.;Short Term Goal: A plan has been developed with personal nutrition goals set during dietitian appointment.;Long Term Goal: Adherence to prescribed nutrition plan.             Nutrition Discharge:   Education Questionnaire Score:  Knowledge Questionnaire Score - 09/11/21 0810       Knowledge Questionnaire Score   Pre  Score 21/26    Post  Score 24/26             Goals reviewed with patient; copy given to patient.

## 2021-09-18 NOTE — Progress Notes (Signed)
Cardiac Individual Treatment Plan  Patient Details  Name: Christian Pena MRN: [id number] Date of Birth: Oct 05, 1950 Referring Provider:   Flowsheet Row Cardiac Rehab from 07/10/2021 in [facility] Cardiac and Pulmonary Rehab  Referring Provider [name]       Initial Encounter Date:  Flowsheet Row Cardiac Rehab from 07/10/2021 in [facility] Cardiac and Pulmonary Rehab  Date 07/10/21       Visit Diagnosis: S/P CABG x 3  Patient's Home Medications on Admission:  Current Outpatient Medications:    aspirin 81 MG EC tablet, Take 81 mg by mouth daily., Disp: , Rfl:    metoprolol succinate (TOPROL-XL) 25 MG 24 hr tablet, Take 12.5 mg by mouth daily., Disp: , Rfl:    Multiple Vitamins-Minerals (MULTIVITAMIN WITH MINERALS) tablet, Take 1 tablet by mouth daily. Centrum silver Adult 50 +, Disp: , Rfl:    omeprazole (PRILOSEC) 20 MG capsule, Take 20 mg by mouth daily., Disp: , Rfl:    rosuvastatin (CRESTOR) 40 MG tablet, Take 40 mg by mouth daily., Disp: , Rfl:    tamsulosin (FLOMAX) 0.4 MG CAPS capsule, Take 1 capsule (0.4 mg total) by mouth daily., Disp: 30 capsule, Rfl: 11  Past Medical History: Past Medical History:  Diagnosis Date   AAA (abdominal aortic aneurysm) 08/18/2019   a.) AAA duplex 08/18/2019 --> measured 3.1 cm. b.) TTE 04/22/2021: interval increase in size to 3.46 cm x 3.70 cm.   Aortic atherosclerosis ([place])    Coronary artery disease 05/08/2021   a.) LHC 05/08/2021: EF 45-50%; 55% pRCA, 95% RV branch, 30% dRCA, 100% OM2, 35% dLCx, 40% mLM, 30% RI, 100% pLAD, 100% mLAD; consult CVTS. b.) 3v CABG 05/29/2021: LIMA-LAD, SVG-LPL-RI (sequenced)   Dyspnea on exertion    Erectile dysfunction    GERD (gastroesophageal reflux disease)    History of 2019 novel coronavirus disease (COVID-19) 03/2021   History of ETOH abuse    History of tobacco abuse    HLD (hyperlipidemia)    Hypertension    Ischemic cardiomyopathy    a.) TTE 04/22/2021: mild LV dysfunction with EF 40%; apical anterior,  anteroseptal, inferior, and inferoseptal akinesis; triv PR, mild MR/TR; G1DD. b.) TTE 08/08/2021: moderate LV dysfunction with mild LVH; EF 35%; mild LA enlargement, triv AR/PR, mild MR, mod TR.   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   Vitamin D deficiency     Tobacco Use: Social History   Tobacco Use  Smoking Status Former   Types: Cigarettes   Quit date: 05/2021   Years since quitting: 0.3  Smokeless Tobacco Never    Labs: Recent Review Flowsheet Data   There is no flowsheet data to display.      Exercise Target Goals: Exercise Program Goal: Individual exercise prescription set using results from initial 6 min walk test and THRR while considering  patients activity barriers and safety.   Exercise Prescription Goal: Initial exercise prescription builds to 30-45 minutes a day of aerobic activity, 2-3 days per week.  Home exercise guidelines will be given to patient during program as part of exercise prescription that the participant will acknowledge.   Education: Aerobic Exercise: - Group verbal and visual presentation on the components of exercise  prescription. Introduces F.I.T.T principle from ACSM for exercise prescriptions.  Reviews F.I.T.T. principles of aerobic exercise including progression. Written material given at graduation. Flowsheet Row Cardiac Rehab from 09/11/2021 in [facility] Cardiac and Pulmonary Rehab  Date 09/11/21  Educator [facility]  Instruction Review Code 1- Verbalizes Understanding       Education: Resistance Exercise: - Group verbal and visual presentation on the components of exercise prescription. Introduces F.I.T.T principle from  ACSM for exercise prescriptions  Reviews F.I.T.T. principles of resistance exercise including progression. Written material given at graduation. Flowsheet Row Cardiac Rehab from 09/11/2021 in [facility] Cardiac and Pulmonary Rehab  Date 07/17/21  Educator [facility]  Instruction Review Code 1- Verbalizes Understanding        Education: Exercise & Equipment Safety: - Individual verbal instruction and demonstration of equipment use and safety with use of the equipment. Flowsheet Row Cardiac Rehab from 09/11/2021 in [facility] Cardiac and Pulmonary Rehab  Date 07/10/21  Educator [facility]  Instruction Review Code 1- Verbalizes Understanding       Education: Exercise Physiology & General Exercise Guidelines: - Group verbal and written instruction with models to review the exercise physiology of the cardiovascular system and associated critical values. Provides general exercise guidelines with specific guidelines to those with heart or lung disease.  Flowsheet Row Cardiac Rehab from 09/11/2021 in [facility] Cardiac and Pulmonary Rehab  Education need identified 07/10/21  Date 09/04/21  Educator [facility]  Instruction Review Code 1- [employer] Understanding       Education: Flexibility, Balance, Mind/Body Relaxation: - Group verbal and visual presentation with interactive activity on the components of exercise prescription. Introduces F.I.T.T principle from ACSM for exercise prescriptions. Reviews F.I.T.T. principles of flexibility and balance exercise training including progression. Also discusses the mind body connection.  Reviews various relaxation techniques to help reduce and manage stress (i.e. Deep breathing, progressive muscle relaxation, and visualization). Balance handout provided to take home. Written material given at graduation.   Activity Barriers & Risk Stratification:  Activity Barriers & Cardiac Risk Stratification - 06/19/21 1402       Activity Barriers & Cardiac Risk Stratification   Activity Barriers Balance  Concerns    Cardiac Risk Stratification High             6 Minute Walk:  6 Minute Walk     Row Name 07/10/21 0915 09/09/21 0847       6 Minute Walk   Phase Initial Discharge    Distance 1365 feet 1510 feet    Distance % Change -- 10.62 %    Distance Feet Change -- 145 ft    Walk Time 6 minutes 6 minutes    # of Rest Breaks 0 0    MPH 2.6 2.86    METS 2.9 3.19    RPE 7 11    Perceived Dyspnea  0 --    VO2 Peak 10.13 11.16    Symptoms Yes (comment) No    Comments little dizziness at beginning that went away --    Resting HR 59 bpm 79 bpm    Resting BP 114/60 112/62    Resting Oxygen Saturation  100 % --    Exercise Oxygen Saturation  during 6 min walk 98 % --    Max Ex. HR 90 bpm 94 bpm    Max Ex. BP 118/72 124/64    2 Minute Post BP 108/68 --             Oxygen Initial Assessment:   Oxygen Re-Evaluation:  Oxygen Discharge (Final Oxygen Re-Evaluation):   Initial Exercise Prescription:  Initial Exercise Prescription - 07/10/21 0900       Date of Initial Exercise RX and Referring Provider   Date 07/10/21    Referring Provider [name]      Oxygen   Maintain Oxygen Saturation 88% or higher      Treadmill   MPH 2.3    Grade 0.5    Minutes 15    METs 2.9      Recumbant Bike   Level 3    RPM 60    Minutes 15    METs 2.9      REL-XR   Level 3    Speed 50    Minutes 15    METs 2.9      T5 Nustep   Level 2    SPM 80    Minutes 15    METs 2.9      Prescription Details   Frequency (times per week) 3    Duration Progress to 30 minutes of continuous aerobic without signs/symptoms of physical distress      Intensity   THRR 40-80% of Max Heartrate 96-133    Ratings of Perceived Exertion 11-13    Perceived Dyspnea 0-4      Resistance Training   Training Prescription Yes    Weight 3 lb    Reps 10-15             Perform Capillary Blood Glucose checks as needed.  Exercise Prescription Changes:   Exercise Prescription Changes      Row Name 07/10/21 0900 07/22/21 0800 08/05/21 1700 09/04/21 0800 09/16/21 1100     Response to Exercise   Blood Pressure (Admit) 114/60 -- 118/68 124/82 132/74   Blood Pressure (Exercise) 118/72 -- 118/84 -- --   Blood Pressure (Exit) 108/68 -- 102/64 130/82 112/60   Heart Rate (Admit) 59 bpm -- 67 bpm 78 bpm 75 bpm   Heart Rate (Exercise) 90 bpm -- 111 bpm 105 bpm 102 bpm   Heart Rate (Exit) 77 bpm -- 81 bpm 74 bpm 81 bpm   Oxygen Saturation (Admit) 100 % -- -- -- --   Oxygen Saturation (Exercise) 98 % -- -- -- --   Rating of Perceived Exertion (Exercise) 7 -- _0 Perceived Dyspnea (Exercise) 0 -- -- -- --   Symptoms little dizzy  dizziness resolved by end of walk --  dizziness resolved by end of walk none none none   Duration -- -- Progress to 30 minutes of  aerobic without signs/symptoms of physical distress Continue with 30 min of aerobic exercise without signs/symptoms of physical distress. Continue with 30 min of aerobic exercise without signs/symptoms of physical distress.   Intensity -- -- THRR unchanged THRR unchanged THRR unchanged     Progression   Progression -- -- Continue to progress workloads to maintain intensity without signs/symptoms of physical distress. Continue to progress workloads to maintain intensity without signs/symptoms of physical distress. Continue to progress workloads to maintain intensity without signs/symptoms of physical distress.   Average METs -- -- 3.4 3.83 3.9     Resistance Training   Training Prescription -- Yes Yes Yes Yes   Weight -- 3 lb 4 lb 4 lb 4 lb   Reps -- 10-15 10-15 10-15 10-15     Interval Training   Interval Training -- -- No No No     Treadmill   MPH -- 2.3 2.6 2.7 2.7  Grade -- 0.5 1.5 1.5 1.5   Minutes -- _0 METs -- 2.9 3.53 3.63 3.63     Recumbant Bike   Level -- 3 -- -- --   RPM -- 60 -- -- --   Minutes -- 15 -- -- --   METs -- 2.9 -- -- --     NuStep   Level -- -- 5 -- --   Minutes -- -- 15  -- --   METs -- -- 3.3 -- --     Recumbant Elliptical   Level -- -- -- 3.5 4   Minutes -- -- -- 15 15   METs -- -- -- 2.7 3.5     REL-XR   Level -- _1 Speed -- 50 -- -- --   Minutes -- _2 METs -- 2.9 3.43 4.3 5.4     T5 Nustep   Level -- 2 -- 2 --   SPM -- 80 -- -- --   Minutes -- 15 -- 15 --   METs -- 2.9 -- 4.7 --     Home Exercise Plan   Plans to continue exercise at -- Home (comment)  use RB and walk at home on off days of cardiac rehab. -- Home (comment)  use RB and walk at home on off days of cardiac rehab. Home (comment)  use RB and walk at home on off days of cardiac rehab.   Frequency -- Add 2 additional days to program exercise sessions. -- Add 2 additional days to program exercise sessions. Add 2 additional days to program exercise sessions.   Initial Home Exercises Provided -- 07/22/21 -- 07/22/21 07/22/21     Oxygen   Maintain Oxygen Saturation -- -- 88% or higher 88% or higher 88% or higher            Exercise Comments:   Exercise Comments     Row Name 07/17/21 0754 09/18/21 0801         Exercise Comments First full day of exercise!  Patient was oriented to gym and equipment including functions, settings, policies, and procedures.  Patient's individual exercise prescription and treatment plan were reviewed.  All starting workloads were established based on the results of the 6 minute walk test done at initial orientation visit.  The plan for exercise progression was also introduced and progression will be customized based on patient's performance and goals. Korry graduated today from  rehab with 36 sessions completed.  Details of the patient's exercise prescription and what He needs to do in order to continue the prescription and progress were discussed with patient.  Patient was given a copy of prescription and goals.  Patient verbalized understanding.  Challen plans to continue to exercise by using the recombent bike and walk at home.                Exercise Goals and Review:   Exercise Goals     Row Name 07/10/21 0930             Exercise Goals   Increase Physical Activity Yes       Intervention Provide advice, education, support and counseling about physical activity/exercise needs.;Develop an individualized exercise prescription for aerobic and resistive training based on initial evaluation findings, risk stratification, comorbidities and participant's personal goals.       Expected Outcomes Short Term: Attend rehab on a regular basis to increase amount of physical activity.;Long  Term: Add in home exercise to make exercise part of routine and to increase amount of physical activity.;Long Term: Exercising regularly at least 3-5 days a week.       Increase Strength and Stamina Yes       Intervention Provide advice, education, support and counseling about physical activity/exercise needs.;Develop an individualized exercise prescription for aerobic and resistive training based on initial evaluation findings, risk stratification, comorbidities and participant's personal goals.       Expected Outcomes Short Term: Increase workloads from initial exercise prescription for resistance, speed, and METs.;Short Term: Perform resistance training exercises routinely during rehab and add in resistance training at home;Long Term: Improve cardiorespiratory fitness, muscular endurance and strength as measured by increased METs and functional capacity (6MWT)       Able to understand and use rate of perceived exertion (RPE) scale Yes       Intervention Provide education and explanation on how to use RPE scale       Expected Outcomes Short Term: Able to use RPE daily in rehab to express subjective intensity level;Long Term:  Able to use RPE to guide intensity level when exercising independently       Able to understand and use Dyspnea scale Yes       Intervention Provide education and explanation on how to use Dyspnea scale       Expected  Outcomes Short Term: Able to use Dyspnea scale daily in rehab to express subjective sense of shortness of breath during exertion;Long Term: Able to use Dyspnea scale to guide intensity level when exercising independently       Knowledge and understanding of Target Heart Rate Range (THRR) Yes       Intervention Provide education and explanation of THRR including how the numbers were predicted and where they are located for reference       Expected Outcomes Short Term: Able to state/look up THRR;Short Term: Able to use daily as guideline for intensity in rehab;Long Term: Able to use THRR to govern intensity when exercising independently       Able to check pulse independently Yes       Intervention Provide education and demonstration on how to check pulse in carotid and radial arteries.;Review the importance of being able to check your own pulse for safety during independent exercise       Expected Outcomes Short Term: Able to explain why pulse checking is important during independent exercise;Long Term: Able to check pulse independently and accurately       Understanding of Exercise Prescription Yes       Intervention Provide education, explanation, and written materials on patient's individual exercise prescription       Expected Outcomes Short Term: Able to explain program exercise prescription;Long Term: Able to explain home exercise prescription to exercise independently                Exercise Goals Re-Evaluation :  Exercise Goals Re-Evaluation     Row Name 07/17/21 0755 07/22/21 0814 08/05/21 1721 08/21/21 0819 09/04/21 0809     Exercise Goal Re-Evaluation   Exercise Goals Review Increase Physical Activity;Able to understand and use rate of perceived exertion (RPE) scale;Knowledge and understanding of Target Heart Rate Range (THRR);Understanding of Exercise Prescription;Able to understand and use Dyspnea scale;Increase Strength and Stamina;Able to check pulse independently Increase  Physical Activity;Able to understand and use rate of perceived exertion (RPE) scale;Knowledge and understanding of Target Heart Rate Range (THRR);Understanding of Exercise Prescription;Able to understand and use  Dyspnea scale;Increase Strength and Stamina;Able to check pulse independently Increase Physical Activity;Increase Strength and Stamina Increase Physical Activity;Increase Strength and Stamina Increase Physical Activity;Increase Strength and Stamina;Understanding of Exercise Prescription   Comments Reviewed RPE and dyspnea scales, THR and program prescription with pt today.  Pt voiced understanding and was given a copy of goals to take home. Reviewed home exercise with pt today.  Pt plans to use RB at home for exercise.  Reviewed THR, pulse, RPE, sign and symptoms, pulse oximetery and when to call 911 or MD.  Also discussed weather considerations and indoor options.  Pt voiced understanding. Christian Pena is doing well in rehab. He has made significant improvement by increasing to a speed of 2.6/ 1.5% incline as well as level 5 on the T4 Nustep. He also increased to 4 lbs for his handweights. Will continue to monitor. Christian Pena is doing well in rehab.  He is not doing too much at home for exercise as he feels lazy.  He does have a recumbent bike at home.  He will also walk around neighborhood on occasion. He is starting to feel stronger. Christian Pena is doing well in rehab.  He is up to level 8 on the XR and 3.5 on teh recumbent elliptical.  We will continue to monitor her progress.   Expected Outcomes Short: Use RPE daily to regulate intensity. Long: Follow program prescription in THR. Short: start exercising at home 1-2 days per week on off days of class. Long: Become independent with exercise routine. Short: Continue working up loads on the treadmill Long: Continue to increase overall MET level Short: Add in more exercise at home again Long: Conitnue to improve stamina. Short: Try 5 lb hand weights and improve post 6MWT   Long: conitnue to improve stamina    Row Name 09/16/21 1131             Exercise Goal Re-Evaluation   Exercise Goals Review Increase Physical Activity;Increase Strength and Stamina;Understanding of Exercise Prescription       Comments Christian Pena continues to do well in rehab. He finished his post 6MWT and improved by over 10%. He is now up to 5.4 METS on the XR. Will continue to monitor until he graduates this week.       Expected Outcomes Short: Graduate Long: Continue to increase overall MET level                Discharge Exercise Prescription (Final Exercise Prescription Changes):  Exercise Prescription Changes - 09/16/21 1100       Response to Exercise   Blood Pressure (Admit) 132/74    Blood Pressure (Exit) 112/60    Heart Rate (Admit) 75 bpm    Heart Rate (Exercise) 102 bpm    Heart Rate (Exit) 81 bpm    Rating of Perceived Exertion (Exercise) 13    Symptoms none    Duration Continue with 30 min of aerobic exercise without signs/symptoms of physical distress.    Intensity THRR unchanged      Progression   Progression Continue to progress workloads to maintain intensity without signs/symptoms of physical distress.    Average METs 3.9      Resistance Training   Training Prescription Yes    Weight 4 lb    Reps 10-15      Interval Training   Interval Training No      Treadmill   MPH 2.7    Grade 1.5    Minutes 15    METs 3.63  Recumbant Elliptical   Level 4    Minutes 15    METs 3.5      REL-XR   Level 8    Minutes 15    METs 5.4      Home Exercise Plan   Plans to continue exercise at Home (comment)   use RB and walk at home on off days of cardiac rehab.   Frequency Add 2 additional days to program exercise sessions.    Initial Home Exercises Provided 07/22/21      Oxygen   Maintain Oxygen Saturation 88% or higher             Nutrition:  Target Goals: Understanding of nutrition guidelines, daily intake of sodium <1534m, cholesterol <2018m  calories 30% from fat and 7% or less from saturated fats, daily to have 5 or more servings of fruits and vegetables.  Education: All About Nutrition: -Group instruction provided by verbal, written material, interactive activities, discussions, models, and posters to present general guidelines for heart healthy nutrition including fat, fiber, MyPlate, the role of sodium in heart healthy nutrition, utilization of the nutrition label, and utilization of this knowledge for meal planning. Follow up email sent as well. Written material given at graduation. Flow[facility]  Education need identified 07/10/21  Date 08/07/21  Educator [place] Code 1- Verbalizes Understanding       Biometrics:  Pre Biometrics - 07/10/21 0931       Pre Biometrics   Height 6' (1.829 m)    Weight 216 lb (98 kg)    BMI (Calculated) 29.29    Single Leg Stand 11.25 seconds             Post Biometrics - 09/17/21 1542        Post  Biometrics   Height 6' (1.829 m)    Weight 220 lb (99.8 kg)    BMI (Calculated) 29.83             Nutrition Therapy Plan and Nutrition Goals:  Nutrition Therapy & Goals - 07/22/21 1558       Nutrition Therapy   Diet Heart healthy, low Na    Drug/Food Interactions Statins/Certain Fruits    Protein (specify units) 80g    Fiber 30 grams    Whole Grain Foods 3 servings    Saturated Fats 12 max. grams    Fruits and Vegetables 8 servings/day    Sodium 2 grams      Personal Nutrition Goals   Nutrition Goal ST: practice reading food labels, limit Na with cooking, eat the rainbow of fruits and vegetables in a week LT: eat 8 fruits/vegetables per day, limit Na < 2g/day, become proficient in label reading    Comments [age].o. M admitted to rehab s/p CABG x3. Presenting with HTN, Afib, asthma, HLD. Relevant medications include crestor, MVI. PYP 49. Vegetables & Fruits 7/12. Breads, Grains & Cereals 6/12.  Red & Processed Meat 4/12. Poultry 0/2. Fish & Shellfish 0/4. Beans, Nuts & Seeds 2/4. Milk & Dairy Foods 2/6. Toppings, Oils, Seasonings & Salt 13/20. Sweets, Snacks & Restaurant Food 7/14. Beverages 8/10. He and his wife feels like his biggest barrier is exploring. He feels he should eat more vegetables. He enjoys fruit. His wife and him have an airfryer instead of frying foods. B: cereal (special K or cheerios) L: cheese and crackers or banana D: they hamburgers monday, pork chops and green beans or  mashed potatoes, spaghetti. He will eat potatoes, broccoli, green beans, green salad, green cabbage, blueberries, strawberries in the summer, tomatoes, oranges (OJ), bell peppers. Discussed heart healthy eating.      Intervention Plan   Intervention Prescribe, educate and counsel regarding individualized specific dietary modifications aiming towards targeted core components such as weight, hypertension, lipid management, diabetes, heart failure and other comorbidities.;Nutrition handout(s) given to patient.    Expected Outcomes Short Term Goal: Understand basic principles of dietary content, such as calories, fat, sodium, cholesterol and nutrients.;Short Term Goal: A plan has been developed with personal nutrition goals set during dietitian appointment.;Long Term Goal: Adherence to prescribed nutrition plan.             Nutrition Assessments:  MEDIFICTS Score Key: ?70 Need to make dietary changes  40-70 Heart Healthy Diet ? 40 Therapeutic Level Cholesterol Diet  Flowsheet Row Cardiac Rehab from 09/11/2021 in [facility] Cardiac and Pulmonary Rehab  Picture Your Plate Total Score on Admission 49  Picture Your Plate Total Score on Discharge 60      Picture Your Plate Scores: <82 Unhealthy dietary pattern with much room for improvement. 41-50 Dietary pattern unlikely to meet recommendations for good health and room for improvement. 51-60 More healthful dietary pattern, with some room for improvement.   >60 Healthy dietary pattern, although there may be some specific behaviors that could be improved.    Nutrition Goals Re-Evaluation:  Nutrition Goals Re-Evaluation     Row Name 07/22/21 0754 08/21/21 0826           Goals   Nutrition Goal -- ST: practice reading food labels, limit Na with cooking, eat the rainbow of fruits and vegetables in a week LT: eat 8 fruits/vegetables per day, limit Na < 2g/day, become proficient in label reading      Comment Patient has not yet met with program dietician for a one on one meeting. Christian Pena is doing well in rehab.  He is not reading his labels well.  They are getting in more rainbow and fruits and vegetables.  They are also limting his salt intake.  They are eating out less and trying to cut back on junk food overall.      Expected Outcome Short: Meet with program dietician to establish speicific dietary goals. Long: Maintian heart healthy diet. Short: Continue to work on reading food labels Long: Continue to eat a better rainbow.               Nutrition Goals Discharge (Final Nutrition Goals Re-Evaluation):  Nutrition Goals Re-Evaluation - 08/21/21 5053       Goals   Nutrition Goal ST: practice reading food labels, limit Na with cooking, eat the rainbow of fruits and vegetables in a week LT: eat 8 fruits/vegetables per day, limit Na < 2g/day, become proficient in label reading    Comment Christian Pena is doing well in rehab.  He is not reading his labels well.  They are getting in more rainbow and fruits and vegetables.  They are also limting his salt intake.  They are eating out less and trying to cut back on junk food overall.    Expected Outcome Short: Continue to work on reading food labels Long: Continue to eat a better rainbow.             Psychosocial: Target Goals: Acknowledge presence or absence of significant depression and/or stress, maximize coping skills, provide positive support system. Participant is able to verbalize types and ability  to use techniques  and skills needed for reducing stress and depression.   Education: Stress, Anxiety, and Depression - Group verbal and visual presentation to define topics covered.  Reviews how body is impacted by stress, anxiety, and depression.  Also discusses healthy ways to reduce stress and to treat/manage anxiety and depression.  Written material given at graduation. Flowsheet Row Cardiac Rehab from 09/11/2021 in [facility] Cardiac and Pul[facility] Rehab  Date 08/28/21  Educator [facility]  Instruction Review Code 1- [employer] Understanding       Education: Sleep Hygiene -Provides group verbal and written instruction about how sleep can affect your health.  Define sleep hygiene, discuss sleep cycles and impact of sleep habits. Review good sleep hygiene tips.    Initial Review & Psychosocial Screening:  Initial Psych Review & Screening - 06/19/21 1405       Initial Review   Current issues with Current Sleep Concerns      Family Dynamics   Good Support System? Yes   wife     Barriers   Psychosocial barriers to participate in program There are no identifiable barriers or psychosocial needs.;The patient should benefit from training in stress management and relaxation.      Screening Interventions   Interventions Encouraged to exercise;Provide feedback about the scores to participant;To provide support and resources with identified psychosocial needs    Expected Outcomes Short Term goal: Utilizing psychosocial counselor, staff and physician to assist with identification of specific Stressors or current issues interfering with healing process. Setting desired goal for each stressor or current issue identified.;Long Term Goal: Stressors or current issues are controlled or eliminated.;Short Term goal: Identification and review with participant of any Quality of Life or Depression concerns found by scoring the questionnaire.;Long Term goal: The participant improves quality of Life and PHQ9 Scores as seen  by post scores and/or verbalization of changes             Quality of Life Scores:   Quality of Life - 09/11/21 0809       Quality of Life Scores   Health/Function Pre 23.07 %    Health/Function Post 25.8 %    Health/Function % Change 11.83 %    Socioeconomic Pre 26.25 %    Socioeconomic Post 29.06 %    Socioeconomic % Change  10.7 %    Psych/Spiritual Pre 25.93 %    Psych/Spiritual Post 26.79 %    Psych/Spiritual % Change 3.32 %    Family Pre 28.8 %    Family Post 28.8 %    Family % Change 0 %    GLOBAL Pre 25.19 %    GLOBAL Post 27.17 %    GLOBAL % Change 7.86 %            Scores of 19 and below usually indicate a poorer quality of life in these areas.  A difference of  2-3 points is a clinically meaningful difference.  A difference of 2-3 points in the total score of the Quality of Life Index has been associated with significant improvement in overall quality of life, self-image, physical symptoms, and general health in studies assessing change in quality of life.  PHQ-9: Recent Review Flowsheet Data     Depression screen [facility] 2/9 09/11/2021 07/10/2021   Decreased Interest 1 1   Down, Depressed, Hopeless 0 0   PHQ - 2 Score 1 1   Altered sleeping 1 1   Tired, decreased energy 1 1   Change in appetite 0 1   Feeling bad  or failure about yourself  0 0   Trouble concentrating 1 0   Moving slowly or fidgety/restless 0 0   Suicidal thoughts 0 0   PHQ-9 Score 4 4   Difficult doing work/chores Not difficult at all Not difficult at all      Interpretation of Total Score  Total Score Depression Severity:  1-4 = Minimal depression, 5-9 = Mild depression, 10-14 = Moderate depression, 15-19 = Moderately severe depression, 20-27 = Severe depression   Psychosocial Evaluation and Intervention:  Psychosocial Evaluation - 06/19/21 1419       Psychosocial Evaluation & Interventions   Interventions Encouraged to exercise with the program and follow exercise prescription     Comments 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 His biggest concern is some nerve damage during surgery that is causing numbness in his left ring and pinky fever. This is causing sleep issues. The MD gave him gabapentin to try but unfortunately that medicine is causing some balance concerns as well. He is giving it a try as he is tired of the dull ache. His wife is very supportive of him and has been a great help during his recovery. He is still sleeping in the guest bedroom since it is a lower bed and easier to get out of. He is happily retired. He is ready to get back into his normal routine and is ready to get started in the program    Expected Outcomes Short: attend cardiac rehab for education and exercise. Long; Develop and maintain positive self care habits    Continue Psychosocial Services  Follow up required by staff             Psychosocial Re-Evaluation:  Psychosocial Re-Evaluation     [place] Name 07/22/21 [phone] 08/21/21 5852           Psychosocial Re-Evaluation   Current issues with Current Sleep Concerns Current Stress Concerns;Current Sleep Concerns      Comments No new stress, sleep, or mental health concerns reported Christian Pena is doing well in rehab.  He is feeling good mentally and denies any major stressors currently.  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  He tries to not let any thing get to him too much.  He is still waking in the middle of the night for about an hour, but just sleeps in a little later in morning.  This was all since surgery.      Expected Outcomes Short: establish a healthy sleep routine. Long: maintain good mental health habits and stress managment. Short: Continue to work on getting improved sleep Long: Continue to stay positive.      Interventions Encouraged to attend Cardiac Rehabilitation for the exercise Encouraged to attend Cardiac Rehabilitation for the exercise      Continue Psychosocial Services   Follow up required by staff Follow up required by staff               Psychosocial Discharge (Final Psychosocial Re-Evaluation):  Psychosocial Re-Evaluation - 08/21/21 0823       Psychosocial Re-Evaluation   Current issues with Current Stress Concerns;Current Sleep Concerns    Comments Christian Pena is doing well in rehab.  He is feeling good mentally and denies any major stressors currently.  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  He tries to not let any thing get to him too much.  He is still  waking in the middle of the night for about an hour, but just sleeps in a little later in morning.  This was all since surgery.    Expected Outcomes Short: Continue to work on getting improved sleep Long: Continue to stay positive.    Interventions Encouraged to attend Cardiac Rehabilitation for the exercise    Continue Psychosocial Services  Follow up required by staff             Vocational Rehabilitation: Provide vocational rehab assistance to qualifying candidates.   Vocational Rehab Evaluation & Intervention:  Vocational Rehab - 06/19/21 1408       Initial Vocational Rehab Evaluation & Intervention   Assessment shows need for Vocational Rehabilitation No             Education: Education Goals: Education classes will be provided on a variety of topics geared toward better understanding of heart health and risk factor modification. Participant will state understanding/return demonstration of topics presented as noted by education test scores.  Learning Barriers/Preferences:  Learning Barriers/Preferences - 06/19/21 1408       Learning Barriers/Preferences   Learning Barriers None    Learning Preferences None             General Cardiac Education Topics:  AED/CPR: - Group verbal and written instruction with the use of models to demonstrate the basic use of the AED with the basic ABC's of resuscitation.   Anatomy and Cardiac  Procedures: - Group verbal and visual presentation and models provide information about basic cardiac anatomy and function. Reviews the testing methods done to diagnose heart disease and the outcomes of the test results. Describes the treatment choices: Medical Management, Angioplasty, or Coronary Bypass Surgery for treating various heart conditions including Myocardial Infarction, Angina, Valve Disease, and Cardiac Arrhythmias.  Written material given at graduation. Flowsheet Row Cardiac Rehab from 09/11/2021 in [facility] Cardiac and Pulmonary Rehab  Date 07/17/21  Educator SB  Instruction Review Code 1- Verbalizes Understanding       Medication Safety: - Group verbal and visual instruction to review commonly prescribed medications for heart and lung disease. Reviews the medication, class of the drug, and side effects. Includes the steps to properly store meds and maintain the prescription regimen.  Written material given at graduation. Flowsheet Row Cardiac Rehab from 09/11/2021 in [facility] Cardiac and Pulmonary Rehab  Date 07/31/21  Educator SB  Instruction Review Code 1- Verbalizes Understanding       Intimacy: - Group verbal instruction through game format to discuss how heart and lung disease can affect sexual intimacy. Written material given at graduation.. Flowsheet Row Cardiac Rehab from 09/11/2021 in [facility] Cardiac and Pulmonary Rehab  Date 09/11/21  Educator [facility]  Instruction Review Code 1- Verbalizes Understanding       Know Your Numbers and Heart Failure: - Group verbal and visual instruction to discuss disease risk factors for cardiac and pulmonary disease and treatment options.  Reviews associated critical values for Overweight/Obesity, Hypertension, Cholesterol, and Diabetes.  Discusses basics of heart failure: signs/symptoms and treatments.  Introduces Heart Failure Zone chart for action plan for heart failure.  Written material given at graduation.   Infection Prevention: - Provides  verbal and written material to individual with discussion of infection control including proper hand washing and proper equipment cleaning during exercise session. Flowsheet Row Cardiac Rehab from 09/11/2021 in [facility] Cardiac and Pulmonary Rehab  Date 07/10/21  Educator AS  Instruction Review Code 1- Verbalizes Understanding  Falls Prevention: - Provides verbal and written material to individual with discussion of falls prevention and safety. Flowsheet Row Cardiac Rehab from 09/11/2021 in [facility] Cardiac and Pulmonary Rehab  Date 07/10/21  Educator AS  Instruction Review Code 1- Verbalizes Understanding       Other: -Provides group and verbal instruction on various topics (see comments)   Knowledge Questionnaire Score:  Knowledge Questionnaire Score - 09/11/21 0810       Knowledge Questionnaire Score   Pre Score 21/26    Post Score 24/26             Core Components/Risk Factors/Patient Goals at Admission:  Personal Goals and Risk Factors at Admission - 07/10/21 0941       Core Components/Risk Factors/Patient Goals on Admission   Number of packs per day 0   quit 05/29/21 - vaping quit cigarettes 6/22   Intervention Assist the participant in steps to quit. Provide individualized education and counseling about committing to Tobacco Cessation, relapse prevention, and pharmacological support that can be provided by physician.;Advice worker, assist with locating and accessing local/national Quit Smoking programs, and support quit date choice.    Expected Outcomes Short Term: Will quit all tobacco product use, adhering to prevention of relapse plan.;Long Term: Complete abstinence from all tobacco products for at least 12 months from quit date.    Hypertension Yes    Intervention Provide education on lifestyle modifcations including regular physical activity/exercise, weight management, moderate sodium restriction and increased consumption of fresh fruit, vegetables,  and low fat dairy, alcohol moderation, and smoking cessation.;Monitor prescription use compliance.    Expected Outcomes Short Term: Continued assessment and intervention until BP is < 140/47m HG in hypertensive participants. < 130/835mHG in hypertensive participants with diabetes, heart failure or chronic kidney disease.;Long Term: Maintenance of blood pressure at goal levels.    Lipids Yes    Intervention Provide education and support for participant on nutrition & aerobic/resistive exercise along with prescribed medications to achieve LDL <709mHDL >89m59m  Expected Outcomes Short Term: Participant states understanding of desired cholesterol values and is compliant with medications prescribed. Participant is following exercise prescription and nutrition guidelines.;Long Term: Cholesterol controlled with medications as prescribed, with individualized exercise RX and with personalized nutrition plan. Value goals: LDL < 70mg30mL > 40 mg.             Education:Diabetes - Individual verbal and written instruction to review signs/symptoms of diabetes, desired ranges of glucose level fasting, after meals and with exercise. Acknowledge that pre and post exercise glucose checks will be done for 3 sessions at entry of program.   Core Components/Risk Factors/Patient Goals Review:   Goals and Risk Factor Review     Row Name 07/10/21 0941 07/22/21 0806 08/21/21 0832 09/06/21 0753       Core Components/Risk Factors/Patient Goals Review   Personal Goals Review Tobacco Cessation Tobacco Cessation;Lipids;Hypertension Tobacco Cessation;Lipids;Hypertension;Weight Management/Obesity Tobacco Cessation    Review Gage Christian Likensrecently quit tobacco use within the last 6 months. Intervention for relapse prevention was provided at the initial medical review. He was encouraged to continue to with tobacco cessation and was provided information on relapse prevention. Patient received information about combination  therapy, tobacco cessation classes, quit line, and quit smoking apps in case of a relapse. Patient demonstrated understanding of this material.Staff will continue to provide encouragement and follow up with the patient throughout the program. Patient is taking all medicaitons a prescribed to help control risk factors. He  continues to not use tobacco products. Christian Pena is doing well in rehab.  His weight is creeping up but he is trying to get it back off. He is doing well with his tobacco cessation. He does have cravings and gave in to a craving last week and had one, but that has been it.  He did note that during that week he had been feeling down and fatigued more than normal and his wife could tell.  Otherwise, he has been good.  His pressures are good in class but he does not check it at home unless he does not feel good. Christian Pena is working on quitting smoking. He is planning on asking his doctor for Chantix. He is motivated to make changes    Expected Outcomes Continued cessation Short: maintian tobacco free lifestyle. Long: become independent with exercise and continue to take medications to control cardiac risk factors. Short: Continue to work on weight loss and cessation Long: Continue to montior risk factors. Short: ask his MD about chantix. Long: independently manage tobacco cessation             Core Components/Risk Factors/Patient Goals at Discharge (Final Review):   Goals and Risk Factor Review - 09/06/21 0753       Core Components/Risk Factors/Patient Goals Review   Personal Goals Review Tobacco Cessation    Review Christian Pena is working on quitting smoking. He is planning on asking his doctor for Chantix. He is motivated to make changes    Expected Outcomes Short: ask his MD about chantix. Long: independently manage tobacco cessation             ITP Comments:  ITP Comments     Row Name 06/19/21 1415 07/10/21 0938 07/17/21 0754 07/22/21 1640 08/07/21 0831   ITP Comments Initial telephone  orientation completed. Diagnosis can be found in [facility] 11/15. EP orientation scheduled for Wednesday 12/28 at 8am. Completed 6MWT and gym orientation. Initial ITP created and sent for review to Dr. Emily Filbert, Medical Director. First full day of exercise!  Patient was oriented to gym and equipment including functions, settings, policies, and procedures.  Patient's individual exercise prescription and treatment plan were reviewed.  All starting workloads were established based on the results of the 6 minute walk test done at initial orientation visit.  The plan for exercise progression was also introduced and progression will be customized based on patient's performance and goals. Completed initial RD consultation 30 Day review completed. Medical Director ITP review done, changes made as directed, and signed approval by Medical Director.    [place] Name 09/04/21 0755 09/18/21 0801         ITP Comments 30 Day review completed. Medical Director ITP review done, changes made as directed, and signed approval by Medical Director. Nicoles graduated today from  rehab with 36 sessions completed.  Details of the patient's exercise prescription and what He needs to do in order to continue the prescription and progress were discussed with patient.  Patient was given a copy of prescription and goals.  Patient verbalized understanding.  Rilen plans to continue to exercise by using the recombent bike and walk at home.               Comments: discharge ITP

## 2021-09-18 NOTE — Progress Notes (Signed)
Daily Session Note ? ?Patient Details  ?Name: Christian Pena ?MRN: [id number] ?Date of Birth: 1951-04-19 ?Referring Provider:   ?Flowsheet Row Cardiac Rehab from 07/10/2021 in [facility] Cardiac and Pulmonary Rehab  ?Referring Provider [name]  ? ?  ? ? ?Encounter Date: 09/18/2021 ? ?Check In: ? Session Check In - 09/18/21 0758   ? ?  ? Check-In  ? Supervising physician immediately available to respond to emergencies See telemetry face sheet for immediately available ER MD   ? Location [facility]-Cardiac & Pulmonary Rehab   ? Staff Present Birdie Sons, MPA, RN;Jessica Luan Pulling, MA, RCEP, CCRP, CCET;Joseph Giddings, [place]   ? Virtual Visit No   ? Medication changes reported     No   ? Fall or balance concerns reported    No   ? Tobacco Cessation No Change   ? Warm-up and Cool-down Performed on first and last piece of equipment   ? Resistance Training Performed Yes   ? VAD Patient? No   ? PAD/SET Patient? No   ?  ? Pain Assessment  ? Currently in Pain? No/denies   ? ?  ?  ? ?  ? ? ? ? ? ?Social History  ? ?Tobacco Use  ?Smoking Status Former  ? Types: Cigarettes  ? Quit date: 05/2021  ? Years since quitting: 0.3  ?Smokeless Tobacco Never  ? ? ?Goals Met:  ?Independence with exercise equipment ?Exercise tolerated well ?No report of concerns or symptoms today ?Strength training completed today ? ?Goals Unmet:  ?Not Applicable ? ?Comments:  Adar graduated today from  rehab with 36 sessions completed.  Details of the patient's exercise prescription and what He needs to do in order to continue the prescription and progress were discussed with patient.  Patient was given a copy of prescription and goals.  Patient verbalized understanding.  Jushua plans to continue to exercise by using the recombent bike and walk at home. ? ? ? ?Dr. Emily Filbert is Medical Director for [facility].  ?Dr. Ottie Glazier is Medical Director for [facility] Pulmonary Rehabilitation. ?

## 2021-09-18 NOTE — Progress Notes (Signed)
Discharge Progress Report ? ?Patient Details  ?Name: Christian Pena ?MRN: [id number] ?Date of Birth: Jul 12, 1951 ?Referring Provider:   ?Flowsheet Row Cardiac Rehab from 07/10/2021 in [facility]  ?Referring Provider [name]  ? ?  ? ? ? ?Number of Visits: 60  ? ?Reason for Discharge:  ?Patient reached a stable level of exercise. ?Patient independent in their exercise. ?Patient has met program and personal goals. ? ?Smoking History:  ?Social History  ? ?Tobacco Use  ?Smoking Status Former  ? Types: Cigarettes  ? Quit date: 05/2021  ? Years since quitting: 0.3  ?Smokeless Tobacco Never  ? ? ?Diagnosis:  ?S/P CABG x 3 ? ?ADL UCSD: ? ? ?Initial Exercise Prescription: ? Initial Exercise Prescription - 07/10/21 0900   ? ?  ? Date of Initial Exercise RX and Referring Provider  ? Date 07/10/21   ? Referring Provider [name]   ?  ? Oxygen  ? Maintain Oxygen Saturation 88% or higher   ?  ? Treadmill  ? MPH 2.3   ? Grade 0.5   ? Minutes 15   ? METs 2.9   ?  ? Recumbant Bike  ? Level 3   ? RPM 60   ? Minutes 15   ? METs 2.9   ?  ? REL-XR  ? Level 3   ? Speed 50   ? Minutes 15   ? METs 2.9   ?  ? T5 Nustep  ? Level 2   ? SPM 80   ? Minutes 15   ? METs 2.9   ?  ? Prescription Details  ? Frequency (times per week) 3   ? Duration Progress to 30 minutes of continuous aerobic without signs/symptoms of physical distress   ?  ? Intensity  ? THRR 40-80% of Max Heartrate 96-133   ? Ratings of Perceived Exertion 11-13   ? Perceived Dyspnea 0-4   ?  ? Resistance Training  ? Training Prescription Yes   ? Weight 3 lb   ? Reps 10-15   ? ?  ?  ? ?  ? ? ?Discharge Exercise Prescription (Final Exercise Prescription Changes): ? Exercise Prescription Changes - 09/16/21 1100   ? ?  ? Response to Exercise  ? Blood Pressure (Admit) 132/74   ? Blood Pressure (Exit) 112/60   ? Heart Rate (Admit) 75 bpm   ? Heart Rate (Exercise) 102 bpm   ? Heart Rate (Exit) 81 bpm   ? Rating of Perceived Exertion (Exercise) 13   ? Symptoms none   ?  Duration Continue with 30 min of aerobic exercise without signs/symptoms of physical distress.   ? Intensity THRR unchanged   ?  ? Progression  ? Progression Continue to progress workloads to maintain intensity without signs/symptoms of physical distress.   ? Average METs 3.9   ?  ? Resistance Training  ? Training Prescription Yes   ? Weight 4 lb   ? Reps 10-15   ?  ? Interval Training  ? Interval Training No   ?  ? Treadmill  ? MPH 2.7   ? Grade 1.5   ? Minutes 15   ? METs 3.63   ?  ? Recumbant Elliptical  ? Level 4   ? Minutes 15   ? METs 3.5   ?  ? REL-XR  ? Level 8   ? Minutes 15   ? METs 5.4   ?  ? Home Exercise Plan  ? Plans to  continue exercise at Home (comment)   use RB and walk at home on off days of cardiac rehab.  ? Frequency Add 2 additional days to program exercise sessions.   ? Initial Home Exercises Provided 07/22/21   ?  ? Oxygen  ? Maintain Oxygen Saturation 88% or higher   ? ?  ?  ? ?  ? ? ?Functional Capacity: ? 6 Minute Walk   ? ? [place] Name 07/10/21 0915 09/09/21 0847  ?  ?  ? 6 Minute Walk  ? Phase Initial Discharge   ? Distance 1365 feet 1510 feet   ? Distance % Change -- 10.62 %   ? Distance Feet Change -- 145 ft   ? Walk Time 6 minutes 6 minutes   ? # of Rest Breaks 0 0   ? MPH 2.6 2.86   ? METS 2.9 3.19   ? RPE 7 11   ? Perceived Dyspnea  0 --   ? VO2 Peak 10.13 11.16   ? Symptoms Yes (comment) No   ? Comments little dizziness at beginning that went away --   ? Resting HR 59 bpm 79 bpm   ? Resting BP 114/60 112/62   ? Resting Oxygen Saturation  100 % --   ? Exercise Oxygen Saturation  during 6 min walk 98 % --   ? Max Ex. HR 90 bpm 94 bpm   ? Max Ex. BP 118/72 124/64   ? 2 Minute Post BP 108/68 --   ? ?  ?  ? ?  ? ? ?Psychological, QOL, Others - Outcomes: ?PHQ 2/9: ?Depression screen [facility] 2/9 09/11/2021 07/10/2021  ?Decreased Interest 1 1  ?Down, Depressed, Hopeless 0 0  ?PHQ - 2 Score 1 1  ?Altered sleeping 1 1  ?Tired, decreased energy 1 1  ?Change in appetite 0 1  ?Feeling bad or failure  about yourself  0 0  ?Trouble concentrating 1 0  ?Moving slowly or fidgety/restless 0 0  ?Suicidal thoughts 0 0  ?PHQ-9 Score 4 4  ?Difficult doing work/chores Not difficult at all Not difficult at all  ? ? ?Quality of Life: ? Quality of Life - 09/11/21 0809   ? ?  ? Quality of Life Scores  ? Health/Function Pre 23.07 %   ? Health/Function Post 25.8 %   ? Health/Function % Change 11.83 %   ? Socioeconomic Pre 26.25 %   ? Socioeconomic Post 29.06 %   ? Socioeconomic % Change  10.7 %   ? Psych/Spiritual Pre 25.93 %   ? Psych/Spiritual Post 26.79 %   ? Psych/Spiritual % Change 3.32 %   ? Family Pre 28.8 %   ? Family Post 28.8 %   ? Family % Change 0 %   ? GLOBAL Pre 25.19 %   ? GLOBAL Post 27.17 %   ? GLOBAL % Change 7.86 %   ? ?  ?  ? ?  ? ? ? ?Nutrition & Weight - Outcomes: ? Pre Biometrics - 07/10/21 0931   ? ?  ? Pre Biometrics  ? Height 6' (1.829 m)   ? Weight 216 lb (98 kg)   ? BMI (Calculated) 29.29   ? Single Leg Stand 11.25 seconds   ? ?  ?  ? ?  ? ? Post Biometrics - 09/17/21 1542   ? ?  ?  Post  Biometrics  ? Height 6' (1.829 m)   ? Weight 220 lb (99.8 kg)   ? BMI (  Calculated) 29.83   ? ?  ?  ? ?  ? ? ?Nutrition: ? Nutrition Therapy & Goals - 07/22/21 1558   ? ?  ? Nutrition Therapy  ? Diet Heart healthy, low Na   ? Drug/Food Interactions Statins/Certain Fruits   ? Protein (specify units) 80g   ? Fiber 30 grams   ? Whole Grain Foods 3 servings   ? Saturated Fats 12 max. grams   ? Fruits and Vegetables 8 servings/day   ? Sodium 2 grams   ?  ? Personal Nutrition Goals  ? Nutrition Goal ST: practice reading food labels, limit Na with cooking, eat the rainbow of fruits and vegetables in a week LT: eat 8 fruits/vegetables per day, limit Na < 2g/day, become proficient in label reading   ? Comments 71 y.o. M admitted to rehab s/p CABG x3. Presenting with HTN, Afib, asthma, HLD. Relevant medications include crestor, MVI. PYP 49. Vegetables & Fruits 7/12. Breads, Grains & Cereals 6/12. Red & Processed Meat 4/12.  Poultry 0/2. Fish & Shellfish 0/4. Beans, Nuts & Seeds 2/4. Milk & Dairy Foods 2/6. Toppings, Oils, Seasonings & Salt 13/20. Sweets, Snacks & Restaurant Food 7/14. Beverages 8/10. He and his wife feels like his biggest barrier is exploring. He feels he should eat more vegetables. He enjoys fruit. His wife and him have an airfryer instead of frying foods. B: cereal (special K or cheerios) L: cheese and crackers or banana D: they hamburgers monday, pork chops and green beans or mashed potatoes, spaghetti. He will eat potatoes, broccoli, green beans, green salad, green cabbage, blueberries, strawberries in the summer, tomatoes, oranges (OJ), bell peppers. Discussed heart healthy eating.   ?  ? Intervention Plan  ? Intervention Prescribe, educate and counsel regarding individualized specific dietary modifications aiming towards targeted core components such as weight, hypertension, lipid management, diabetes, heart failure and other comorbidities.;Nutrition handout(s) given to patient.   ? Expected Outcomes Short Term Goal: Understand basic principles of dietary content, such as calories, fat, sodium, cholesterol and nutrients.;Short Term Goal: A plan has been developed with personal nutrition goals set during dietitian appointment.;Long Term Goal: Adherence to prescribed nutrition plan.   ? ?  ?  ? ?  ? ? ?Nutrition Discharge: ? ? ?Education Questionnaire Score: ? Knowledge Questionnaire Score - 09/11/21 0810   ? ?  ? Knowledge Questionnaire Score  ? Pre Score 21/26   ? Post Score 24/26   ? ?  ?  ? ?  ? ? ?Goals reviewed with patient; copy given to patient. ?

## 2021-10-17 ENCOUNTER — Ambulatory Visit: Payer: Medicare HMO | Admitting: Physician Assistant

## 2021-10-17 DIAGNOSIS — D494 Neoplasm of unspecified behavior of bladder: Secondary | ICD-10-CM | POA: Diagnosis not present

## 2021-10-17 LAB — MICROSCOPIC EXAMINATION: Bacteria, UA: NONE SEEN

## 2021-10-17 LAB — URINALYSIS, COMPLETE
Bilirubin, UA: NEGATIVE
Glucose, UA: NEGATIVE
Leukocytes,UA: NEGATIVE
Nitrite, UA: NEGATIVE
Protein,UA: NEGATIVE
RBC, UA: NEGATIVE
Specific Gravity, UA: >1.03 — ABNORMAL HIGH (ref 1.005–1.030)
Urobilinogen, Ur: 1 mg/dL (ref 0.2–1.0)
pH, UA: 6 (ref 5.0–7.5)

## 2021-10-17 MED ORDER — BCG LIVE 50 MG IS SUSR
1.6200 mL | Freq: Once | INTRAVESICAL | Status: AC
  Administered 2021-10-17: 40.5 mg via INTRAVESICAL

## 2021-10-17 NOTE — Progress Notes (Signed)
BCG Bladder Instillation ? ?BCG # 1 of 3 ? ?Due to Bladder Cancer patient is present today for a BCG treatment. Patient was cleaned and prepped in a sterile fashion with betadine. A 14FR catheter was inserted, urine return was noted 62m, urine was yellow in color.  548mof reconstituted BCG was instilled into the bladder. The catheter was then removed. Patient tolerated well, no complications were noted ? ?Performed by: SaDebroah LoopPA-C and ReVerlene MayerCMA ? ?Additional notes: Additional notes: Patient received a split, 1/2 dose of BCG today due to BCG shortage. Patient notified and all questions answered.  ? ?Follow up: 1 week for BCG #2 of 3 ?  ?

## 2021-10-17 NOTE — Patient Instructions (Signed)

## 2021-10-24 ENCOUNTER — Ambulatory Visit: Payer: Medicare HMO | Admitting: Physician Assistant

## 2021-10-24 DIAGNOSIS — D494 Neoplasm of unspecified behavior of bladder: Secondary | ICD-10-CM | POA: Diagnosis not present

## 2021-10-24 LAB — MICROSCOPIC EXAMINATION
Bacteria, UA: NONE SEEN
Epithelial Cells (non renal): NONE SEEN /hpf (ref 0–10)

## 2021-10-24 LAB — URINALYSIS, COMPLETE
Bilirubin, UA: NEGATIVE
Glucose, UA: NEGATIVE
Leukocytes,UA: NEGATIVE
Nitrite, UA: NEGATIVE
Protein,UA: NEGATIVE
RBC, UA: NEGATIVE
Specific Gravity, UA: 1.025 (ref 1.005–1.030)
Urobilinogen, Ur: 1 mg/dL (ref 0.2–1.0)
pH, UA: 6.5 (ref 5.0–7.5)

## 2021-10-24 MED ORDER — BCG LIVE 50 MG IS SUSR
1.6200 mL | Freq: Once | INTRAVESICAL | Status: AC
  Administered 2021-10-24: 40.5 mg via INTRAVESICAL

## 2021-10-24 NOTE — Progress Notes (Signed)
BCG Bladder Instillation ? ?BCG # 2 of 3 ? ?Due to Bladder Cancer patient is present today for a BCG treatment. Patient was cleaned and prepped in a sterile fashion with betadine. A 14FR catheter was inserted, urine return was noted 5m, urine was yellow in color.  528mof reconstituted BCG was instilled into the bladder. The catheter was then removed. Patient tolerated well, no complications were noted ? ?Performed by: SaDebroah LoopPA-C and AlKerman PasseyCMA ? ?Additional notes: Patient received a split, 1/2 dose of BCG today due to BCG shortage. Patient notified and all questions answered.  ? ?Follow up: 1 week for BCG #3 of 3 ?  ?

## 2021-10-31 ENCOUNTER — Ambulatory Visit: Payer: Medicare HMO | Admitting: Physician Assistant

## 2021-10-31 DIAGNOSIS — D494 Neoplasm of unspecified behavior of bladder: Secondary | ICD-10-CM

## 2021-10-31 DIAGNOSIS — N401 Enlarged prostate with lower urinary tract symptoms: Secondary | ICD-10-CM

## 2021-10-31 DIAGNOSIS — N138 Other obstructive and reflux uropathy: Secondary | ICD-10-CM

## 2021-10-31 LAB — URINALYSIS, COMPLETE
Bilirubin, UA: NEGATIVE
Glucose, UA: NEGATIVE
Ketones, UA: NEGATIVE
Leukocytes,UA: NEGATIVE
Nitrite, UA: NEGATIVE
Protein,UA: NEGATIVE
RBC, UA: NEGATIVE
Specific Gravity, UA: >1.03 — ABNORMAL HIGH (ref 1.005–1.030)
Urobilinogen, Ur: 0.2 mg/dL (ref 0.2–1.0)
pH, UA: 5.5 (ref 5.0–7.5)

## 2021-10-31 LAB — MICROSCOPIC EXAMINATION: Bacteria, UA: NONE SEEN

## 2021-10-31 MED ORDER — BCG LIVE 50 MG IS SUSR
3.2400 mL | Freq: Once | INTRAVESICAL | Status: AC
  Administered 2021-10-31: 81 mg via INTRAVESICAL

## 2021-10-31 NOTE — Progress Notes (Signed)
BCG Bladder Instillation ? ?BCG # 3 of 3 ? ?Due to Bladder Cancer patient is present today for a BCG treatment. Patient was cleaned and prepped in a sterile fashion with betadine. A 14FR catheter was inserted, urine return was noted 3m, urine was yellow in color.  567mof reconstituted BCG was instilled into the bladder. The catheter was then removed. Patient tolerated well, no complications were noted ? ?Performed by: SaDebroah LoopPA-C and ReVerlene MayerCMA ? ?Additional notes: Patient received a split, 1/2 dose of BCG today due to BCG shortage. Patient notified and all questions answered.  ? ?Follow up: 3 months for cystoscopy with Dr. BrErlene Quan?  ?

## 2021-12-27 IMAGING — US US RENAL
2 series · 13 of 25 positions shown · non-contrast
Comparison: None.

CLINICAL DATA: Urinary bladder mass

EXAM:
RENAL / URINARY TRACT ULTRASOUND COMPLETE

[Series 1: us renal · 0.25mm/px · 12 of 46 slices shown (1 of 2)]
[im 1/46]
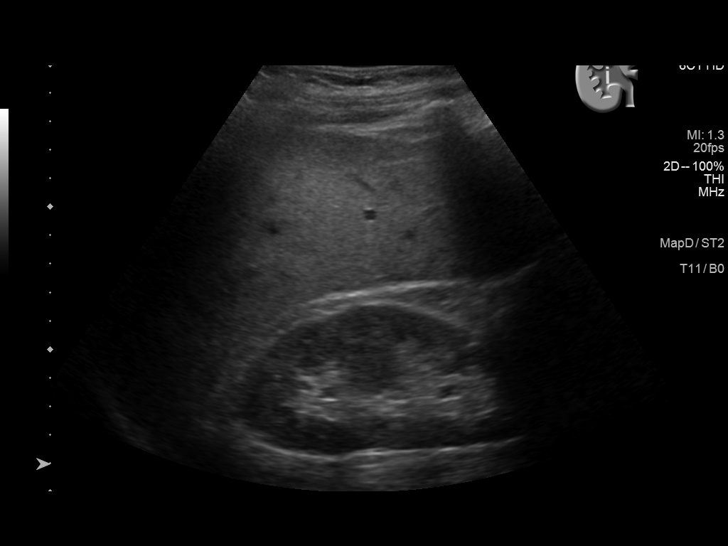
[im 4/46]
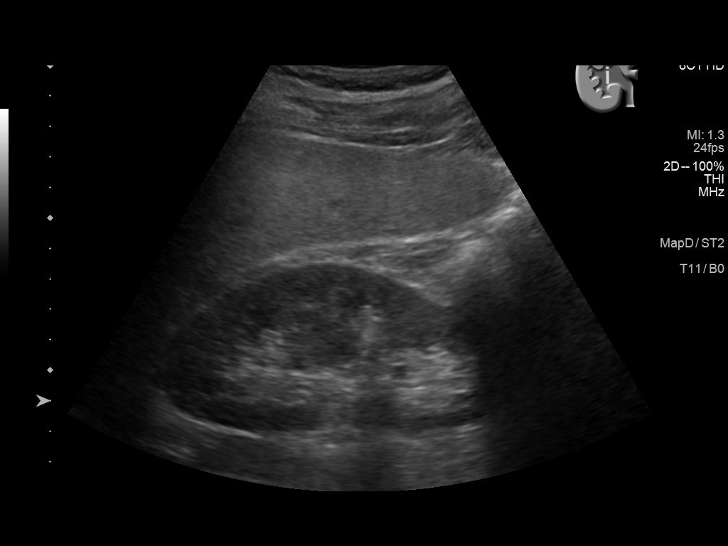
[im 8/46]
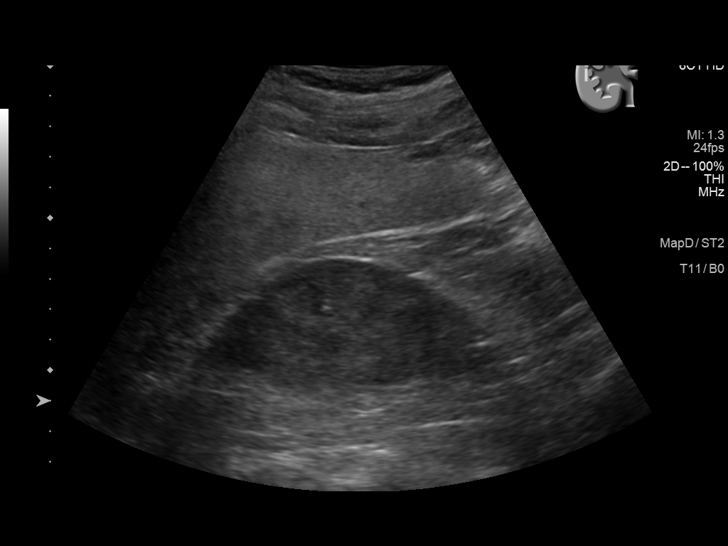
[im 12/46]
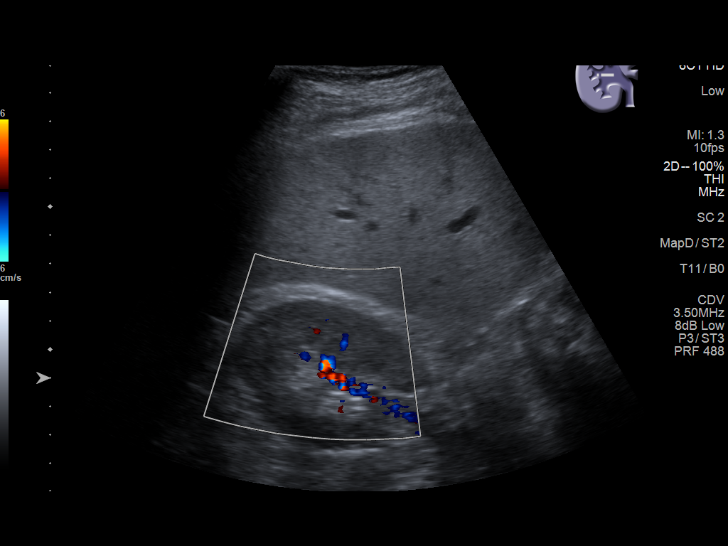
[im 16/46]
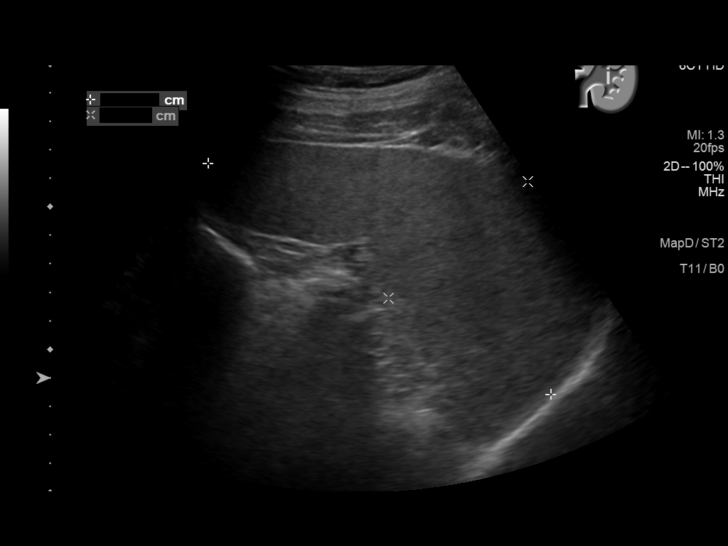
[im 20/46]
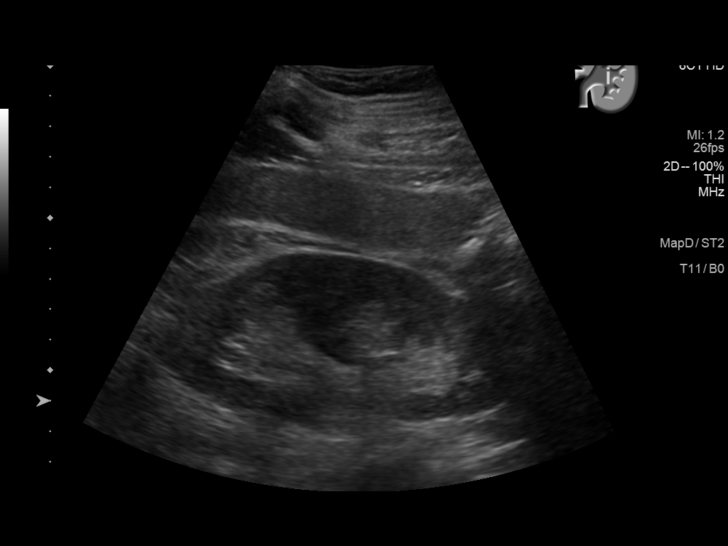
[im 24/46]
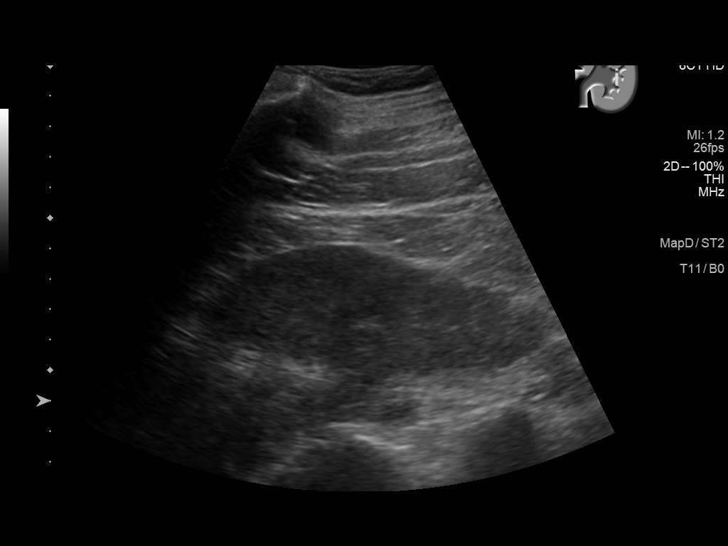
[im 28/46]
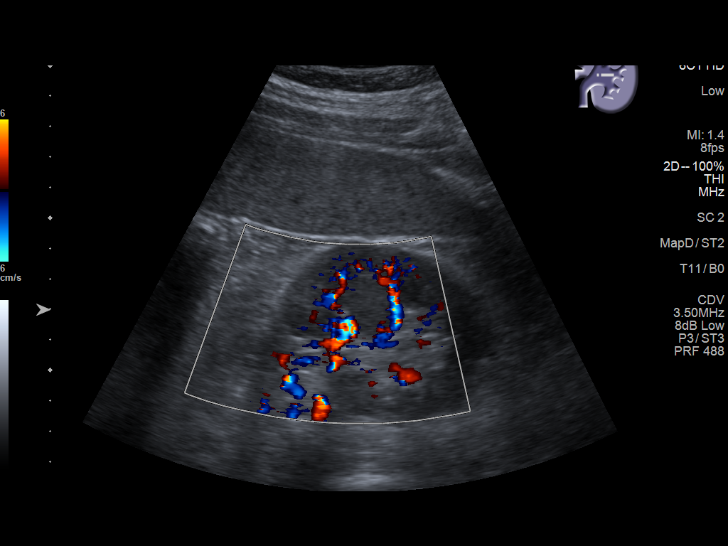
[im 32/46]
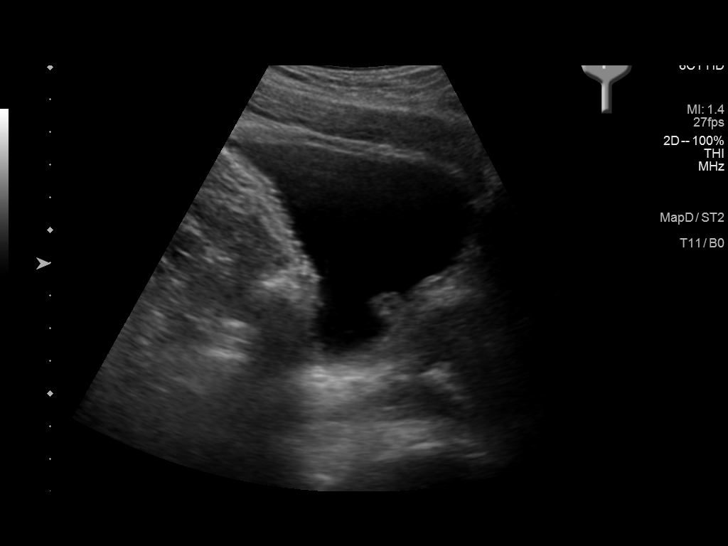
[im 36/46]
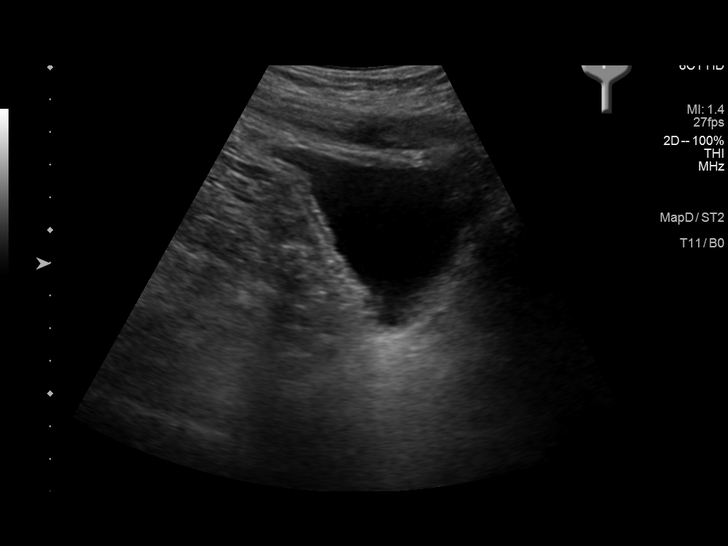
[im 40/46]
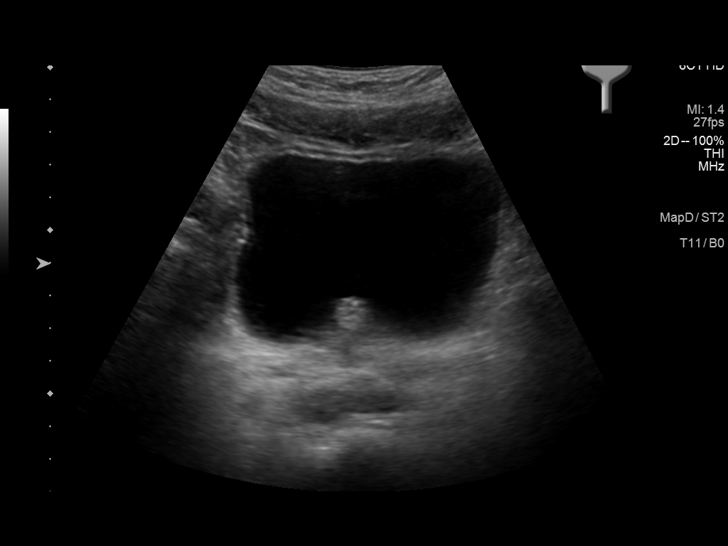
[im 44/46]
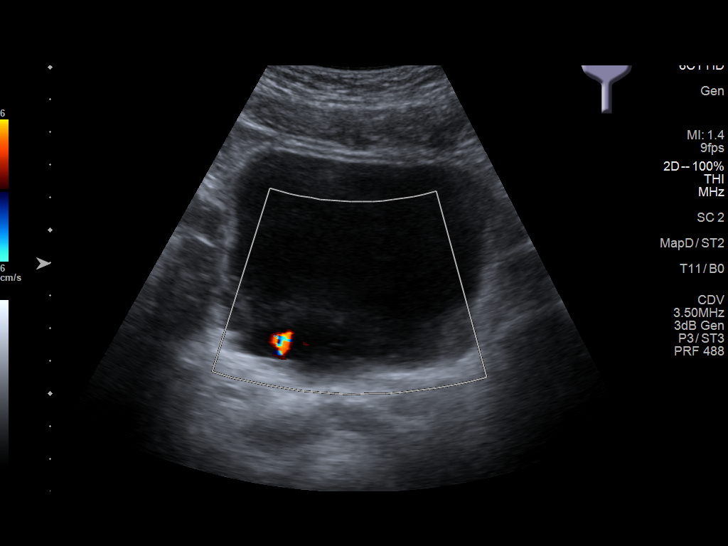

[Series 2001: us renal · 0.22mm/px · 1 of 2 slices shown (2 of 2)]
[im 1/2]
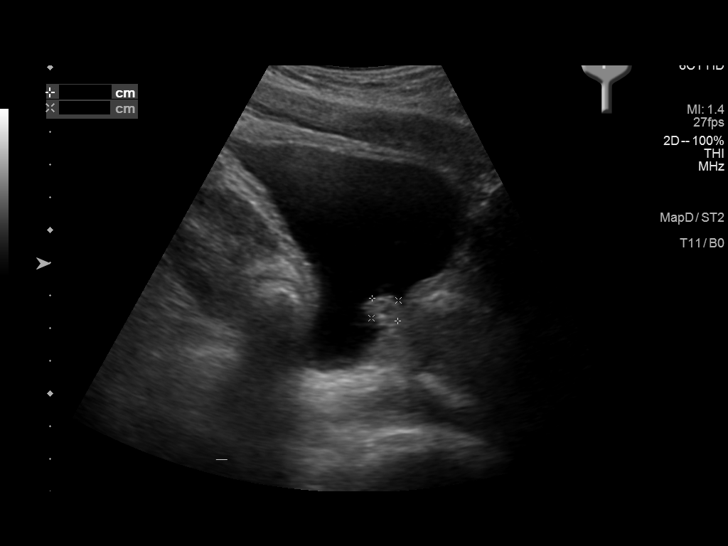

[13 of 25 positions shown; findings below may reference images not displayed]

FINDINGS: Right Kidney:

Renal measurements: 11.1 x 5.3 x 5.8 cm = volume: 178.2 mL.
Echogenicity and renal cortical thickness are within normal limits.
No mass, perinephric fluid, or hydronephrosis visualized. No
sonographically demonstrable calculus or ureterectasis.

Left Kidney:

Renal measurements: 12.3 x 5.4 x 5.0 cm = volume: 192.4 mL.
Echogenicity and renal cortical thickness are within normal limits.
No mass, perinephric fluid, or hydronephrosis visualized. No
sonographically demonstrable calculus or ureterectasis.

Bladder:

There is an apparent mass arising along the posterior aspect of the
bladder measuring 1.1 x 1.0 x 1.2 cm. No appreciable urinary bladder
wall thickening.

Other:

Prostate measures 4.7 x 4.2 x 5.6 cm. Echotexture mildly
inhomogeneous.

Spleen measures 14.5 x 6.4 x 13.2 cm with a measured splenic volume
of 612 mL. No splenic lesions evident.
IMPRESSION: 1. Apparent mass arising from the posterior aspect of the bladder
measuring 1.1 x 1.0 x 1.2 cm. Urinary bladder otherwise appears
unremarkable.

2.  Prostate prominent and mildly inhomogeneous in echotexture.

3.  Normal appearing kidneys bilaterally.

4.  Enlarged spleen.

## 2022-01-06 ENCOUNTER — Other Ambulatory Visit: Payer: Self-pay | Admitting: Urology

## 2022-01-07 ENCOUNTER — Other Ambulatory Visit: Payer: Self-pay | Admitting: Urology

## 2022-02-03 NOTE — Progress Notes (Signed)
   02/04/22 CC:  Chief Complaint  Patient presents with   Cysto      HPI: Christian Pena is a 71 y.o. 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     He is s/p TURBT on 07/19/2020 that revealed high-grade noninvasive urothelial carcinoma; 1 cm. There were no other bladder lesion identified and the muscularis propria was appreciated and not involved.    Cystoscopy on 07/30/2021 showed <1cm tumor on right bladder wall consistent with a small reoccurrence.    He underwent a TURBT on 08/19/2021. Intraoperative findings showed very small approximately 5 mm superficial appearing tumor on the right posterior bladder wall, no other tumors appreciated.  Unremarkable bilateral retrogrades.  Postprocedural gemcitabine.   He did undergo a 6-week course of induction BCG completed in 09/2020.  This was well-tolerated.   Surgical pathology revealed polypoid fragment of reactive urothelial mucosa with lymphoid aggregate and negative malignancy.   H is s/p BCG x3. His last instillation was on 10/31/2021.   Vitals:   02/04/22 0956  BP: 116/69  Pulse: 81   NED. A&Ox3.   No respiratory distress   Abd soft, NT, ND Normal phallus with bilateral descended testicles  Cystoscopy Procedure Note  Patient identification was confirmed, informed consent was obtained, and patient was prepped using Betadine solution.  Lidocaine jelly was administered per urethral meatus.     Pre-Procedure: - Inspection reveals a normal caliber ureteral meatus.  Procedure: The flexible cystoscope was introduced without difficulty - No urethral strictures/lesions are present. - Enlarged prostate  - Elevated bladder neck - Bilateral ureteral orifices identified - Bladder mucosa  reveals no ulcers, tumors, or lesions - No bladder stones - No trabeculation  Retroflexion shows NED   Post-Procedure: - Patient tolerated the procedure  well   Assessment/ Plan: History of bladder cancer  - Cystoscopy showed NED - Will have him undergo maintenance BCG after next cytoscopy.  - Continue surveillance with cysto 3 months --> will likely be last course unless has recurrence  2. BPH with nocturia  - Would like a refill of his Flomax - Flomax; refill sent  Return for surveillance cystoscopy (05/06/2022)  Conley Rolls as a scribe for Hollice Espy, MD.,have documented all relevant documentation on the behalf of Hollice Espy, MD,as directed by  Hollice Espy, MD while in the presence of Hollice Espy, MD.  I have reviewed the above documentation for accuracy and completeness, and I agree with the above.   Hollice Espy, MD

## 2022-02-04 ENCOUNTER — Ambulatory Visit: Payer: Medicare HMO | Admitting: Urology

## 2022-02-04 ENCOUNTER — Encounter: Payer: Self-pay | Admitting: Urology

## 2022-02-04 VITALS — BP 116/69 | HR 81 | Ht 72.0 in | Wt 220.0 lb

## 2022-02-04 DIAGNOSIS — Z8551 Personal history of malignant neoplasm of bladder: Secondary | ICD-10-CM | POA: Diagnosis not present

## 2022-02-04 LAB — URINALYSIS, COMPLETE
Bilirubin, UA: NEGATIVE
Glucose, UA: NEGATIVE
Ketones, UA: NEGATIVE
Leukocytes,UA: NEGATIVE
Nitrite, UA: NEGATIVE
Protein,UA: NEGATIVE
RBC, UA: NEGATIVE
Specific Gravity, UA: 1.02 (ref 1.005–1.030)
Urobilinogen, Ur: 0.2 mg/dL (ref 0.2–1.0)
pH, UA: 5.5 (ref 5.0–7.5)

## 2022-02-04 LAB — MICROSCOPIC EXAMINATION: Bacteria, UA: NONE SEEN

## 2022-02-04 MED ORDER — TAMSULOSIN HCL 0.4 MG PO CAPS: 0.4000 mg | ORAL_CAPSULE | Freq: Every day | ORAL | 3 refills | Status: DC

## 2022-05-06 ENCOUNTER — Ambulatory Visit: Payer: Medicare HMO | Admitting: Urology

## 2022-05-06 ENCOUNTER — Encounter: Payer: Self-pay | Admitting: Urology

## 2022-05-06 VITALS — BP 96/61 | HR 72 | Ht 72.0 in | Wt 227.0 lb

## 2022-05-06 DIAGNOSIS — N401 Enlarged prostate with lower urinary tract symptoms: Secondary | ICD-10-CM

## 2022-05-06 DIAGNOSIS — N138 Other obstructive and reflux uropathy: Secondary | ICD-10-CM

## 2022-05-06 DIAGNOSIS — Z8551 Personal history of malignant neoplasm of bladder: Secondary | ICD-10-CM

## 2022-05-06 DIAGNOSIS — R351 Nocturia: Secondary | ICD-10-CM

## 2022-05-06 LAB — MICROSCOPIC EXAMINATION

## 2022-05-06 LAB — URINALYSIS, COMPLETE
Bilirubin, UA: NEGATIVE
Glucose, UA: NEGATIVE
Leukocytes,UA: NEGATIVE
Nitrite, UA: NEGATIVE
Protein,UA: NEGATIVE
RBC, UA: NEGATIVE
Specific Gravity, UA: 1.025 (ref 1.005–1.030)
Urobilinogen, Ur: 1 mg/dL (ref 0.2–1.0)
pH, UA: 5.5 (ref 5.0–7.5)

## 2022-05-06 NOTE — Progress Notes (Signed)
   05/06/22 CC:  Chief Complaint  Patient presents with   Cysto      HPI: Christian Pena is a 71 y.o. 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    He is s/p TURBT on 07/19/2020 that revealed high-grade noninvasive urothelial carcinoma; 1 cm. There were no other bladder lesion identified and the muscularis propria was appreciated and not involved.    Cystoscopy on 07/30/2021 showed <1cm tumor on right bladder wall consistent with a small reoccurrence.    He underwent a TURBT on 08/19/2021. Intraoperative findings showed very small approximately 5 mm superficial appearing tumor on the right posterior bladder wall, no other tumors appreciated.  Unremarkable bilateral retrogrades.  Postprocedural gemcitabine.  Surgical pathology revealed polypoid fragment of reactive urothelial mucosa with lymphoid aggregate and negative malignancy.    He did undergo a 6-week course of induction BCG completed in 09/2020.  This was well-tolerated.   He is s/p BCG x3. His last instillation was on 10/31/2021.   Vitals:   05/06/22 0826  BP: 96/61  Pulse: 72   NED. A&Ox3.   No respiratory distress   Abd soft, NT, ND Normal phallus with bilateral descended testicles  Cystoscopy Procedure Note  Patient identification was confirmed, informed consent was obtained, and patient was prepped using Betadine solution.  Lidocaine jelly was administered per urethral meatus.     Pre-Procedure: - Inspection reveals a normal caliber ureteral meatus.  Procedure: The flexible cystoscope was introduced without difficulty - No urethral strictures/lesions are present. - Enlarged prostate  - Elevated bladder neck - Bilateral ureteral orifices identified - Bladder mucosa  reveals no ulcers, tumors, or lesions - No bladder stones - No trabeculation  Retroflexion shows NED   Post-Procedure: - Patient tolerated the procedure  well   Assessment/ Plan:  History of bladder cancer  - Cystoscopy showed NED -Discussed the option of continued maintenance BCG-after discussion we will not proceed with any further maintenance at this time - Continue surveillance with cysto 3 months   2. BPH with nocturia  -continue flomax  Return in about 3 months (around 08/06/2022) for cysto.   Hollice Espy, MD

## 2022-08-13 ENCOUNTER — Other Ambulatory Visit: Payer: Medicare HMO | Admitting: Urology

## 2022-08-27 ENCOUNTER — Ambulatory Visit: Payer: Medicare HMO | Admitting: Urology

## 2022-08-27 ENCOUNTER — Other Ambulatory Visit: Payer: Self-pay | Admitting: Urology

## 2022-08-27 ENCOUNTER — Telehealth: Payer: Self-pay

## 2022-08-27 VITALS — BP 128/76 | Ht 72.0 in

## 2022-08-27 DIAGNOSIS — Z8551 Personal history of malignant neoplasm of bladder: Secondary | ICD-10-CM

## 2022-08-27 DIAGNOSIS — N401 Enlarged prostate with lower urinary tract symptoms: Secondary | ICD-10-CM

## 2022-08-27 DIAGNOSIS — D494 Neoplasm of unspecified behavior of bladder: Secondary | ICD-10-CM

## 2022-08-27 DIAGNOSIS — R351 Nocturia: Secondary | ICD-10-CM

## 2022-08-27 DIAGNOSIS — Z01818 Encounter for other preprocedural examination: Secondary | ICD-10-CM

## 2022-08-27 NOTE — H&P (View-Only) (Signed)
   08/27/22 CC:  Chief Complaint  Patient presents with   Cysto      HPI: Christian Pena is a 72 y.o. 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    He is s/p TURBT on 07/19/2020 that revealed high-grade noninvasive urothelial carcinoma; 1 cm. There were no other bladder lesion identified and the muscularis propria was appreciated and not involved.    Cystoscopy on 07/30/2021 showed <1cm tumor on right bladder wall consistent with a small reoccurrence.    He underwent a TURBT on 08/19/2021. Intraoperative findings showed very small approximately 5 mm superficial appearing tumor on the right posterior bladder wall, no other tumors appreciated.  Unremarkable bilateral retrogrades.  Postprocedural gemcitabine.  Surgical pathology revealed polypoid fragment of reactive urothelial mucosa with lymphoid aggregate and negative malignancy.    He did undergo a 6-week course of induction BCG completed in 09/2020.  This was well-tolerated.   He is s/p BCG x3. His last instillation was on 10/31/2021.   Vitals:   08/27/22 0823  BP: 128/76   NED. A&Ox3.   No respiratory distress   Abd soft, NT, ND Normal phallus with bilateral descended testicles  Cystoscopy Procedure Note  Patient identification was confirmed, informed consent was obtained, and patient was prepped using Betadine solution.  Lidocaine jelly was administered per urethral meatus.     Pre-Procedure: - Inspection reveals a normal caliber ureteral meatus.  Procedure: The flexible cystoscope was introduced without difficulty - No urethral strictures/lesions are present. - Enlarged prostate  - Elevated bladder neck - Bilateral ureteral orifices identified - Bladder mucosa  reveals no ulcers, tumors, or lesions - No bladder stones - No trabeculation  Retroflexion shows NED   Post-Procedure: - Patient tolerated the procedure  well   Assessment/ Plan:  History of bladder cancer  - Cystoscopy showed NED - Continue surveillance with cysto 6 months, will decrease frequency   2. BPH with nocturia  -continue flomax -Prior to discovering bladder cancer, he had been interested in [place].  He underwent workup including cystoscopy as well as transrectal ultrasound at which time his prostate measured 54 g.  This was deferred in light of bladder cancer finding.  He is not interested in pursuing this as his urinary symptoms remain suboptimal on Flomax along with side effects from the medication which are unpleasant. -We reviewed risk and benefits of UroLift extensively today including risk of bleeding, infection, damage surrounding structures, clip migration malfunction or failure to improve his urinary symptoms, dull pelvic pain amongst others.  All questions were answered.  Preoperative urine culture today.  Cysto in 6 mo   Hollice Espy, MD

## 2022-08-27 NOTE — Progress Notes (Signed)
   08/27/22 CC:  Chief Complaint  Patient presents with   Cysto      HPI: Christian Pena is a 72 y.o. 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    He is s/p TURBT on 07/19/2020 that revealed high-grade noninvasive urothelial carcinoma; 1 cm. There were no other bladder lesion identified and the muscularis propria was appreciated and not involved.    Cystoscopy on 07/30/2021 showed <1cm tumor on right bladder wall consistent with a small reoccurrence.    He underwent a TURBT on 08/19/2021. Intraoperative findings showed very small approximately 5 mm superficial appearing tumor on the right posterior bladder wall, no other tumors appreciated.  Unremarkable bilateral retrogrades.  Postprocedural gemcitabine.  Surgical pathology revealed polypoid fragment of reactive urothelial mucosa with lymphoid aggregate and negative malignancy.    He did undergo a 6-week course of induction BCG completed in 09/2020.  This was well-tolerated.   He is s/p BCG x3. His last instillation was on 10/31/2021.   Vitals:   08/27/22 0823  BP: 128/76   NED. A&Ox3.   No respiratory distress   Abd soft, NT, ND Normal phallus with bilateral descended testicles  Cystoscopy Procedure Note  Patient identification was confirmed, informed consent was obtained, and patient was prepped using Betadine solution.  Lidocaine jelly was administered per urethral meatus.     Pre-Procedure: - Inspection reveals a normal caliber ureteral meatus.  Procedure: The flexible cystoscope was introduced without difficulty - No urethral strictures/lesions are present. - Enlarged prostate  - Elevated bladder neck - Bilateral ureteral orifices identified - Bladder mucosa  reveals no ulcers, tumors, or lesions - No bladder stones - No trabeculation  Retroflexion shows NED   Post-Procedure: - Patient tolerated the procedure  well   Assessment/ Plan:  History of bladder cancer  - Cystoscopy showed NED - Continue surveillance with cysto 6 months, will decrease frequency   2. BPH with nocturia  -continue flomax -Prior to discovering bladder cancer, he had been interested in [place].  He underwent workup including cystoscopy as well as transrectal ultrasound at which time his prostate measured 54 g.  This was deferred in light of bladder cancer finding.  He is not interested in pursuing this as his urinary symptoms remain suboptimal on Flomax along with side effects from the medication which are unpleasant. -We reviewed risk and benefits of UroLift extensively today including risk of bleeding, infection, damage surrounding structures, clip migration malfunction or failure to improve his urinary symptoms, dull pelvic pain amongst others.  All questions were answered.  Preoperative urine culture today.  Cysto in 6 mo   Hollice Espy, MD

## 2022-08-27 NOTE — Telephone Encounter (Signed)
I spoke with Christian Pena. We have discussed possible surgery dates and Monday February 19th, 2024 was agreed upon by all parties. Patient given information about surgery date, what to expect pre-operatively and post operatively.  We discussed that a Pre-Admission Testing office will be calling to set up the pre-op visit that will take place prior to surgery, and that these appointments are typically done over the phone with a Pre-Admissions RN. Informed patient that our office will communicate any additional care to be provided after surgery. Patients questions or concerns were discussed during our call. Advised to call our office should there be any additional information, questions or concerns that arise. Patient verbalized understanding.

## 2022-08-27 NOTE — Addendum Note (Signed)
Addended by: Gerald Leitz A on: 08/27/2022 10:58 AM   Modules accepted: Orders

## 2022-08-27 NOTE — Progress Notes (Signed)
   [facility]  Surgery Date: Date: 09/01/2022  Surgeon: Dr. Hollice Espy, MD  Inpt ( No  )   Outpt (Yes)   Obs ( No  )   Diagnosis: N40.1, N13.8 Benign Prostatic Hyperplasia with Urinary Obstruction   -CPT: 52441, [phone]  Surgery: Cystoscopy with Insertion of Urolift  Stop Anticoagulations: Yes, May continue ASA  Cardiac/Medical/Pulmonary Clearance needed: no  *Orders entered into EPIC  Date: 08/27/22   *Case booked in [place]  Date: 08/27/22  *Notified pt of Surgery: Date: 08/27/22  PRE-OP UA & CX: no  *Placed into Prior Authorization Work Que Date: 08/27/22  Assistant/laser/rep:No

## 2022-08-27 NOTE — Progress Notes (Signed)
Surgical Physician Order Form [facility] [place]  * Scheduling expectation : Next Available  *Length of Case:   *Clearance needed: no  *Anticoagulation Instructions: Hold all anticoagulants  *Aspirin Instructions: Ok to continue Aspirin  *Post-op visit Date/Instructions:  4-6 week w/PVR  *Diagnosis: BPH w/urinary obstruction  *Procedure:  urolift     Additional orders: N/A  -Admit type: OUTpatient  -Anesthesia: MAC  -VTE Prophylaxis Standing Order SCD's       Other:   -Standing Lab Orders Per Anesthesia    Lab other: None  -Standing Test orders EKG/Chest x-ray per Anesthesia       Test other:   - Medications:  Ancef 2gm IV  -Other orders:  N/A

## 2022-08-28 LAB — MICROSCOPIC EXAMINATION: RBC, Urine: NONE SEEN /hpf (ref 0–2)

## 2022-08-28 LAB — URINALYSIS, COMPLETE
Bilirubin, UA: NEGATIVE
Glucose, UA: NEGATIVE
Leukocytes,UA: NEGATIVE
Nitrite, UA: NEGATIVE
RBC, UA: NEGATIVE
Specific Gravity, UA: 1.02 (ref 1.005–1.030)
Urobilinogen, Ur: 1 mg/dL (ref 0.2–1.0)
pH, UA: 5.5 (ref 5.0–7.5)

## 2022-08-29 ENCOUNTER — Encounter
Admission: RE | Admit: 2022-08-29 | Discharge: 2022-08-29 | Disposition: A | Discharge status: Home / Self Care | Payer: Medicare HMO | Source: Ambulatory Visit | Attending: Urology | Admitting: Urology

## 2022-08-29 VITALS — Ht 72.0 in | Wt 223.1 lb

## 2022-08-29 DIAGNOSIS — Z951 Presence of aortocoronary bypass graft: Secondary | ICD-10-CM

## 2022-08-29 DIAGNOSIS — I10 Essential (primary) hypertension: Secondary | ICD-10-CM | POA: Diagnosis not present

## 2022-08-29 DIAGNOSIS — I2510 Atherosclerotic heart disease of native coronary artery without angina pectoris: Secondary | ICD-10-CM | POA: Diagnosis not present

## 2022-08-29 DIAGNOSIS — I714 Abdominal aortic aneurysm, without rupture, unspecified: Secondary | ICD-10-CM | POA: Diagnosis not present

## 2022-08-29 DIAGNOSIS — Z01812 Encounter for preprocedural laboratory examination: Secondary | ICD-10-CM

## 2022-08-29 DIAGNOSIS — Z01818 Encounter for other preprocedural examination: Secondary | ICD-10-CM | POA: Insufficient documentation

## 2022-08-29 DIAGNOSIS — Z01810 Encounter for preprocedural cardiovascular examination: Secondary | ICD-10-CM

## 2022-08-29 DIAGNOSIS — I4891 Unspecified atrial fibrillation: Secondary | ICD-10-CM

## 2022-08-29 HISTORY — DX: Personal history of other diseases of the circulatory system: Z86.79

## 2022-08-29 HISTORY — DX: Benign prostatic hyperplasia with lower urinary tract symptoms: N13.8

## 2022-08-29 HISTORY — DX: Other obstructive and reflux uropathy: N40.1

## 2022-08-29 HISTORY — DX: Ventricular premature depolarization: I49.3

## 2022-08-29 LAB — BASIC METABOLIC PANEL
Anion gap: 5 (ref 5–15)
BUN: 21 mg/dL (ref 8–23)
CO2: 27 mmol/L (ref 22–32)
Calcium: 8.8 mg/dL — ABNORMAL LOW (ref 8.9–10.3)
Chloride: 108 mmol/L (ref 98–111)
Creatinine, Ser: 1.07 mg/dL (ref 0.61–1.24)
GFR, Estimated: >60 mL/min (ref >60)
Glucose, Bld: 96 mg/dL (ref 70–99)
Potassium: 4.3 mmol/L (ref 3.5–5.1)
Sodium: 140 mmol/L (ref 135–145)

## 2022-08-29 LAB — CBC
HCT: 39.6 % (ref 39.0–52.0)
Hemoglobin: 13.5 g/dL (ref 13.0–17.0)
MCH: 31.2 pg (ref 26.0–34.0)
MCHC: 34.1 g/dL (ref 30.0–36.0)
MCV: 91.5 fL (ref 80.0–100.0)
Platelets: 177 10*3/uL (ref 150–400)
RBC: 4.33 MIL/uL (ref 4.22–5.81)
RDW: 12.5 % (ref 11.5–15.5)
WBC: 6 10*3/uL (ref 4.0–10.5)
nRBC: 0 % (ref 0.0–0.2)

## 2022-08-29 NOTE — Progress Notes (Signed)
Perioperative Services  Pre-Admission/Anesthesia Testing Clinical Review  Date: 08/29/22  Patient Demographics:  Name: Christian Pena DOB:   04-May-1951 MRN:   [id number]  Planned Surgical Procedure(s):    Case: [id number] Date/Time: 09/01/22 1158   Procedure: CYSTOSCOPY WITH INSERTION OF UROLIFT   Anesthesia type: Monitor Anesthesia Care   Pre-op diagnosis: Benign Prostatic Hyperplasia with Urinary Obstruction   Location: [facility] OR ROOM 10 / [place] FOR ANESTHESIA GROUP   Surgeons: Hollice Espy, MD   NOTE: Available PAT nursing documentation and vital signs have been reviewed. Clinical nursing staff has updated patient's PMH/PSHx, current medication list, and drug allergies/intolerances to ensure comprehensive history available to assist in medical decision making as it pertains to the aforementioned surgical procedure and anticipated anesthetic course. Extensive review of available clinical information performed.  PMH and PSHx updated with any diagnoses/procedures that  may have been inadvertently omitted during his intake with the pre-admission testing department's nursing staff.  Clinical Discussion:  Christian Pena is a 72 y.o. male who is submitted for pre-surgical anesthesia review and clearance prior to him undergoing the above procedure. Patient is a Former Smoker (quit 05/2021). Pertinent PMH includes: CAD (s/p CABG), AAA, ischemic cardiomyopathy, PVD, aortic atherosclerosis, HTN, HLD, DOE, asthma, GERD (on daily PPI), OA, urothelial carcinoma, ETOH use.   Patient is followed by cardiology [name], MD). She was last seen in the cardiology clinic on 05/14/2022; notes reviewed. At the time of @HIS$ @ clinic visit, patient doing well overall from a cardiovascular perspective. Patient denied any chest pain, shortness of breath, PND, orthopnea, palpitations, significant peripheral edema, weakness,  vertiginous symptoms, or presyncope/syncope. Patient with chronic fatigue that  was stable and at baseline. Patient with a past medical history significant for cardiovascular diagnoses. Documented physical exam was grossly benign, providing no evidence of acute exacerbation and/or decompensation of the patient's known cardiovascular conditions.  AAA duplex performed on 08/18/2019 revealing aneurysmal dilatation of the abdominal aorta measuring 3.1 cm.  TTE performed on 04/22/2021 revealed mild segmental left ventricular systolic dysfunction with an EF of 40%.  There was trivial pulmonary and mild mitral/tricuspid valve regurgitation.  There is no evidence of valvular stenosis.  Diastolic Doppler parameters consistent with abnormal relaxation (G1DD).  There was apical anterior, anteroseptal, inferior, and inferoseptal wall akinesis/scar.  Infrarenal abdominal aortic aneurysm with maximum diameter of 3.46 x 3.70 cm.  Myocardial perfusion imaging study performed on 04/22/2021 revealed a significantly reduced LVEF of 35%.  There was akinesis of the apical and distal anterior myocardium.  SPECT images revealed a large fixed apical myocardial perfusion defect consistent with previous infarct and/or scar.  Additionally, there was a moderate reversible anterior and septal myocardial perfusion defect consistent with myocardial ischemia.  Patient underwent diagnostic LEFT heart catheterization on 05/08/2021.  Mildly reduced left ventricular systolic function with [id number] percent.  There was multivessel CAD with percent second marginal, 35% distal LCx, 40% mid LM, 30% ramus intermedius, 100% proximal LAD, and 100% mid LAD.  There was apical akinesis of the left ventricle and hypokinesis of the distal anterior wall.  Patient was referred to CVTS for further evaluation and consideration of CABG procedure.  Patient underwent a three-vessel CABG procedure on 05/29/2021.  LIMA-LAD and sequenced SVG-LPL-RI bypass grafts were placed.  Repeat TTE performed on 08/08/2021 revealed moderate left  ventricular systolic dysfunction with mild LVH; LVEF 35%.  Left atrium mildly enlarged.  There was trivial AR/PR, mild MR, and moderate TR.  There was no evidence of valvular stenosis.  Large  left pleural effusion noted.  Blood pressure reasonably controlled at 130/80 mmHg on currently prescribed beta-blocker (metoprolol succinate) monotherapy.  Patient is on rosuvastatin for his HLD and further ASCVD prevention.  He is not diabetic. Patient does not have an OSAH diagnosis. Functional capacity, as defined by DASI, is documented as being >/= 4 METS.  No changes were made to his medication regimen.  Patient to follow-up with outpatient cardiology in 6 months or sooner if needed.  Christian Pena is scheduled for a CYSTOSCOPY WITH INSERTION OF UROLIFT on 09/01/2022 with Dr. Hollice Espy, MD. Given patient's past medical history significant for cardiovascular diagnoses, presurgical cardiac clearance was sought by the PAT team. Per cardiology, "this patient is optimized for surgery and may proceed with the planned procedural course with a LOW risk of significant perioperative cardiovascular complications".  This patient is on daily antiplatelet therapy. He has been instructed on recommendations for holding his daily low-dose ASA for 3 days prior to his procedure with plans to restart as soon as postoperative bleeding risk felt to be minimized by his attending surgeon. The patient has been instructed that his last dose of his anticoagulant will be on 08/28/2022.  Patient denies previous perioperative complications with anesthesia in the past. In review of the available records, it is noted that patient underwent a general anesthetic course here at [facility] in 08/2021 without documented complications.      08/29/2022   10:00 AM 08/27/2022    8:23 AM 05/06/2022    8:26 AM  Vitals with BMI  Height 6' 0"$  6' 0"$  6' 0"$   Weight 223 lbs 2 oz  227 lbs  BMI [id number]  [id number]   Systolic  0000000 96  Diastolic  76 61  Pulse   72    Providers/Specialists:   NOTE: Primary physician provider listed below. Patient may have been seen by APP or partner within same practice.   PROVIDER ROLE / SPECIALTY LAST Lu Duffel, MD Urology (Surgeon) 08/27/2022  Gauger, Victoriano Lain, NP Primary Care Provider 07/24/2022  Serafina Royals, MD Cardiology 05/14/2022   Allergies:  Patient has no known allergies.  Current Home Medications:   No current facility-administered medications for this encounter.    aspirin 81 MG EC tablet   buPROPion HCl (WELLBUTRIN PO)   metoprolol succinate (TOPROL-XL) 25 MG 24 hr tablet   Multiple Vitamins-Minerals (MULTIVITAMIN WITH MINERALS) tablet   nicotine polacrilex (COMMIT) 4 MG lozenge   omeprazole (PRILOSEC) 20 MG capsule   rosuvastatin (CRESTOR) 40 MG tablet   tamsulosin (FLOMAX) 0.4 MG CAPS capsule   valACYclovir (VALTREX) 1000 MG tablet   History:   Past Medical History:  Diagnosis Date   AAA (abdominal aortic aneurysm) ([place]) 08/18/2019   a.) AAA duplex 08/18/2019 --> measured 3.1 cm. b.) TTE 04/22/2021: interval increase in size to 3.46 cm x 3.70 cm.   Aortic atherosclerosis (HCC)    BPH with urinary obstruction    Coronary artery disease 05/08/2021   a.) LHC 05/08/2021: EF 45-50%; 55% pRCA, 95% RV branch, 30% dRCA, 100% OM2, 35% dLCx, 40% mLM, 30% RI, 100% pLAD, 100% mLAD; consult CVTS. b.) 3v CABG 05/29/2021: LIMA-LAD, SVG-LPL-RI (sequenced)   Dyspnea on exertion    Erectile dysfunction    Frequent PVCs    GERD (gastroesophageal reflux disease)    H/O orthostatic hypotension    History of 2019 novel coronavirus disease (COVID-19) 03/2021   History of ETOH abuse    History of  tobacco abuse    HLD (hyperlipidemia)    Hypertension    Ischemic cardiomyopathy    a.) TTE 04/22/2021: mild LV dysfunction with EF 40%; apical anterior, anteroseptal, inferior, and inferoseptal akinesis; triv PR, mild MR/TR; G1DD. b.) TTE  08/08/2021: moderate LV dysfunction with mild LVH; EF 35%; mild LA enlargement, triv AR/PR, mild MR, mod TR.   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   Vitamin D deficiency    Past Surgical History:  Procedure Laterality Date   CORONARY ARTERY BYPASS GRAFT N/A 05/29/2021   Procedure: 3v CORONARTY ARTERY BYPASS GRAFT (LIMA-LAD, SVG-LPL-RI); Location: [facility]; Surgeon: Sharmon Leyden, MD   CYSTOSCOPY W/ RETROGRADES Bilateral 07/19/2020   Procedure: CYSTOSCOPY WITH RETROGRADE PYELOGRAM;  Surgeon: Hollice Espy, MD;  Location: [facility];  Service: Urology;  Laterality: Bilateral;   CYSTOSCOPY W/ RETROGRADES Bilateral 08/19/2021   Procedure: CYSTOSCOPY WITH RETROGRADE PYELOGRAM;  Surgeon: Hollice Espy, MD;  Location: [facility] OR[facility];  Service: Urology;  Laterality: Bilateral;   LEFT HEART CATH AND CORONARY ANGIOGRAPHY Left 05/08/2021   Procedure: LEFT HEART CATH AND CORONARY ANGIOGRAPHY;  Surgeon: Corey Skains, MD;  Location: [facility];  Service: Cardiovascular;  Laterality: Left;   TONSILLECTOMY     TRANSURETHRAL RESECTION OF BLADDER TUMOR N/A 08/19/2021   Procedure: TRANSURETHRAL RESECTION OF BLADDER TUMOR (TURBT) WITH INSTILLATION OF GEMCITABINE;  Surgeon: Hollice Espy, MD;  Location: [facility];  Service: Urology;  Laterality: N/A;   TRANSURETHRAL RESECTION OF BLADDER TUMOR WITH MITOMYCIN-C N/A 07/19/2020   Procedure: TRANSURETHRAL RESECTION OF BLADDER TUMOR WITH Gemcitabine;  Surgeon: Hollice Espy, MD;  Location: [facility];  Service: Urology;  Laterality: N/A;   No family history on file. Social History   Tobacco Use   Smoking status: Former    Packs/day:  1.00    Years: 50.00    Total pack years: 50.00    Types: Cigarettes    Quit date: 04/2022    Years since quitting: 0.3   Smokeless tobacco: Never  Vaping Use   Vaping Use: Never used  Substance Use Topics   Alcohol use: Yes    Alcohol/week: 4.0 - 5.0 standard drinks of alcohol    Types: 4 - 5 Cans of beer per week    Comment: 2 beers weekly   Drug use: Never    Pertinent Clinical Results:  LABS: Labs reviewed: Acceptable for surgery.  Hospital Outpatient Visit on 08/29/2022  Component Date Value Ref Range Status   WBC 08/29/2022 6.0  4.0 - 10.5 K/uL Final   RBC 08/29/2022 4.33  4.22 - 5.81 MIL/uL Final   Hemoglobin 08/29/2022 13.5  13.0 - 17.0 g/dL Final   HCT 08/29/2022 39.6  39.0 - 52.0 % Final   MCV 08/29/2022 91.5  80.0 - 100.0 fL Final   MCH 08/29/2022 31.2  26.0 - 34.0 pg Final   MCHC 08/29/2022 34.1  30.0 - 36.0 g/dL Final   RDW 08/29/2022 12.5  11.5 - 15.5 % Final   Platelets 08/29/2022 177  150 - 400 K/uL Final   nRBC 08/29/2022 0.0  0.0 - 0.2 % Final   Performed at [facility], [address], [place], [place] [address]  Sodium 08/29/2022 140  135 - 145 mmol/L Final   Potassium 08/29/2022 4.3  3.5 - 5.1 mmol/L Final   Chloride 08/29/2022 108  98 - 111 mmol/L Final   CO2 08/29/2022 27  22 - 32 mmol/L Final   Glucose, Bld 08/29/2022 96  70 - 99 mg/dL Final   Glucose reference range applies only to samples taken after fasting for at least 8 hours.   BUN 08/29/2022 21  8 - 23 mg/dL Final   Creatinine, Ser 08/29/2022 1.07  0.61 - 1.24 mg/dL Final   Calcium 08/29/2022 8.8 (L)  8.9 - 10.3 mg/dL Final   GFR, Estimated 08/29/2022 >60  >60 mL/min Final   Comment: (NOTE) Calculated using the CKD-EPI Creatinine Equation (2021)    Anion gap 08/29/2022 5  5 - 15 Final   Performed at [facility], [address] [place], [place] [address]  Procedure visit on 08/27/2022  Component Date Value Ref Range Status   Specific Gravity, UA 08/27/2022 1.020   1.005 - 1.030 Final   pH, UA 08/27/2022 5.5  5.0 - 7.5 Final   Color, UA 08/27/2022 Yellow  Yellow Final   Appearance Ur 08/27/2022 Clear  Clear Final   Leukocytes,UA 08/27/2022 Negative  Negative Final   Protein,UA 08/27/2022 Trace (A)  Negative/Trace Final   Glucose, UA 08/27/2022 Negative  Negative Final   Ketones, UA 08/27/2022 Trace (A)  Negative Final   RBC, UA 08/27/2022 Negative  Negative Final   Bilirubin, UA 08/27/2022 Negative  Negative Final   Urobilinogen, Ur 08/27/2022 1.0  0.2 - 1.0 mg/dL Final   Nitrite, UA 08/27/2022 Negative  Negative Final   Microscopic Examination 08/27/2022 See below:   Final   WBC, UA 08/27/2022 0-5  0 - 5 /hpf Final   RBC, Urine 08/27/2022 None seen  0 - 2 /hpf Final   Epithelial Cells (non renal) 08/27/2022 0-10  0 - 10 /hpf Final   Bacteria, UA 08/27/2022 Few  None seen/Few Final    ECG: Date: 08/29/2022 Time ECG obtained: 1152 AM Rate: 66 bpm Rhythm: SR with PVCs; IRBBB Axis (leads I and aVF): Normal Intervals: PR 152 ms. QRS 116 ms. QTc 419 ms.  ST segment and T wave changes: Anterolateral TWIs. Evidence of an age undetermined anterior infarct present.  Comparison: Previous tracing obtained on 07/11/2020 showed NSR at 89 bpm. Evidence of an age undetermined anterior infarct also present at that time.    IMAGING / PROCEDURES: MYOCARDIAL PERFUSION IMAGING STUDY (LEXISCAN) performed on 04/24/2022 Mild segmental left ventricular systolic dysfunction with ejection fraction 38% Akinesis of the anterior apex Large perfusion abnormality of severe intensity of the apical region consistent with previous infarct and/or scar without evidence of myocardial ischemia Indeterminate treadmill ECG due to baseline ECG changes  TRANSTHORACIC ECHOCARDIOGRAM performed on 04/24/2022 Moderately reduced left ventricular systolic dysfunction with mild LVH; LVEF 35% Apical akinesis Global hypokinesis Left atrium mildly enlarged Mild mitral, tricuspid, and  pulmonary valve regurgitation Normal gradients; no valvular stenosis No pericardial effusion  CORONARY ARTERY BYPASS GRAFTING performed on 05/29/2021 Three-vessel CABG procedure LIMA-LAD Sequenced SVG-LPL-RI  LEFT HEART CATHETERIZATION AND CORONARY ANGIOGRAPHY performed on 05/08/2021 LVEF 45-50%  Mild left ventricular systolic dysfunction  LVEDP mildly elevated  Multivessel CAD 55% proximal RCA  95% RV branch 30% distal RCA 100% second marginal 35% distal LCx 40% mid LM 30% ramus intermedius 100% proximal LAD 100% mid LAD Recommendations: No further cardiac intervention at this time due to occluded LAD with good collateralization and previously  completed myocardial infarction High intensity cholesterol therapy Beta-blocker ACE inhibitor for LV systolic dysfunction Isosorbide for collateral flow to LAD Further intervention considered if patient has significant symptoms not relieved by above    Impression and Plan:  Christian Pena has been referred for pre-anesthesia review and clearance p and clearance prior to him undergoing the planned anesthetic and procedural courses. Available labs, pertinent testing, and imaging results were personally reviewed by me in preparation for upcoming operative/procedural course. [facility] medical record has been updated following extensive record review and patient interview with PAT staff.   This patient has been appropriately cleared by cardiology with an overall LOW risk of significant perioperative cardiovascular complications. Based on clinical review performed today (08/29/22 ), barring any significant acute changes in the patient's overall condition, it is anticipated that he will be able to proceed with the planned surgical intervention. Any acute changes in clinical condition may necessitate his procedure being postponed and/or cancelled. Patient will meet with anesthesia team (MD and/or CRNA) on the day of his procedure for preoperative  evaluation/assessment. Questions regarding anesthetic course will be fielded at that time.   Pre-surgical instructions were reviewed with the patient during his PAT appointment, and questions were fielded to satisfaction by PAT clinical staff. He has been instructed on which medications that he will need to hold prior to surgery, as well as the ones that have been deemed safe/appropriate to take of the day of his procedure. As part of the general education provided by PAT, patient made aware both verbally and in writing, that he would need to abstain from the use of any illegal substances during his perioperative course. He was advised that failure to follow the provided instructions could necessitate case cancellation or result serious perioperative complications up to and including death. Patient encouraged to contact PAT and/or his surgeon's office to discuss any questions or concerns that may arise prior to surgery; verbalized understanding.   Honor Loh, MSN, APRN, FNP-C, CEN [facility]  Peri-operative Services Nurse Practitioner Phone: [phone] Fax: [phone] 08/29/22 3:08 PM  NOTE: This note has been prepared using Dragon dictation software. Despite my best ability to proofread, there is always the potential that unintentional transcriptional errors may still occur from this process.

## 2022-08-29 NOTE — Patient Instructions (Addendum)
Your procedure is scheduled on:09-01-22 Monday Report to the Registration Desk on the 1st floor of the [facility].Then proceed to the 2nd floor Surgery Desk To find out your arrival time, please call [phone] between 1PM - 3PM on:08-29-22 Friday If your arrival time is 6:00 am, do not arrive before that time as the [facility] entrance doors do not open until 6:00 am.  REMEMBER: Instructions that are not followed completely may result in serious medical risk, up to and including death; or upon the discretion of your surgeon and anesthesiologist your surgery may need to be rescheduled.  Do not eat food OR drink any liquids after midnight the night before surgery.  No gum chewing or hard candies.  One week prior to surgery:Stop NOW (08-29-22) Stop Anti-inflammatories (NSAIDS) such as Advil, Aleve, Ibuprofen, Motrin, Naproxen, Naprosyn and Aspirin based products such as Excedrin, Goody's Powder, BC Powder.You may however, take Tylenol if needed for pain up until the day of surgery. Stop ANY OVER THE COUNTER supplements/vitamins until after surgery (Multivitamin)  Continue your 81 mg Aspirin up until the day prior to surgery as instructed by Dr Erlene Quan. Do NOT take the morning of surgery  Continue taking all prescribed medications with the exception of the following:Multivitamin   [place] WITH A SIP OF WATER: -buPROPion HCl (WELLBUTRIN) -metoprolol succinate (TOPROL-XL)  -rosuvastatin (CRESTOR)  -omeprazole (PRILOSEC)-take one the night before and one on the morning of surgery - helps to prevent nausea after surgery.)  No Alcohol for 24 hours before or after surgery.  No Smoking including e-cigarettes for 24 hours before surgery.  No chewable tobacco products for at least 6 hours before surgery.  No nicotine patches on the day of surgery.  Do not use any "recreational" drugs for at least a week (preferably 2 weeks) before your surgery.   Please be advised that the combination of cocaine and anesthesia may have negative outcomes, up to and including death. If you test positive for cocaine, your surgery will be cancelled.  On the morning of surgery brush your teeth with toothpaste and water, you may rinse your mouth with mouthwash if you wish. Do not swallow any toothpaste or mouthwash.  Do not wear jewelry, make-up, hairpins, clips or nail polish.  Do not wear lotions, powders, or perfumes.   Do not shave body hair from the neck down 48 hours before surgery.  Contact lenses, hearing aids and dentures may not be worn into surgery.  Do not bring valuables to the hospital. [facility] is not responsible for any missing/lost belongings or valuables.    Notify your doctor if there is any change in your medical condition (cold, fever, infection).  Wear comfortable clothing (specific to your surgery type) to the hospital.  After surgery, you can help prevent lung complications by doing breathing exercises.  Take deep breaths and cough every 1-2 hours. Your doctor may order a device called an Incentive Spirometer to help you take deep breaths. When coughing or sneezing, hold a pillow firmly against your incision with both hands. This is called "splinting." Doing this helps protect your incision. It also decreases belly discomfort.  If you are being admitted to the hospital overnight, leave your suitcase in the car. After surgery it may be brought to your room.  In case of increased patient census, it may be necessary for you, the patient, to continue your postoperative care in the Same Day Surgery department.  If you are being  discharged the day of surgery, you will not be allowed to drive home. You will need a responsible individual to drive you home and stay with you for 24 hours after surgery.   If you are taking public transportation, you will need to have a responsible individual with you.  Please call the  [facility] Dept. at [phone] if you have any questions about these instructions.  Surgery Visitation Policy:  Patients undergoing a surgery or procedure may have two family members or support persons with them as long as the person is not COVID-19 positive or experiencing its symptoms.   Due to an increase in RSV and influenza rates and associated hospitalizations, children ages 66 and under will not be able to visit patients in [facility]. Masks continue to be strongly recommended.

## 2022-08-31 MED ORDER — LACTATED RINGERS IV SOLN: INTRAVENOUS | Status: DC

## 2022-08-31 MED ORDER — CEFAZOLIN SODIUM-DEXTROSE 2-4 GM/100ML-% IV SOLN
2.0000 g | INTRAVENOUS | Status: AC
  Administered 2022-09-01: 2 g via INTRAVENOUS

## 2022-08-31 MED ORDER — CHLORHEXIDINE GLUCONATE 0.12 % MT SOLN
15.0000 mL | Freq: Once | OROMUCOSAL | Status: AC
  Administered 2022-09-01: 15 mL via OROMUCOSAL

## 2022-08-31 MED ORDER — ORAL CARE MOUTH RINSE: 15.0000 mL | Freq: Once | OROMUCOSAL | Status: AC

## 2022-09-01 ENCOUNTER — Encounter: Payer: Self-pay | Admitting: Urology

## 2022-09-01 ENCOUNTER — Ambulatory Visit: Payer: Medicare HMO | Admitting: Urgent Care

## 2022-09-01 ENCOUNTER — Encounter
Admission: RE | Disposition: A | Discharge status: Home / Self Care | Payer: Self-pay | Source: Home / Self Care | Attending: Urology

## 2022-09-01 ENCOUNTER — Ambulatory Visit
Admission: RE | Admit: 2022-09-01 | Discharge: 2022-09-01 | Disposition: A | Discharge status: Home / Self Care | Payer: Medicare HMO | Attending: Urology | Admitting: Urology

## 2022-09-01 ENCOUNTER — Other Ambulatory Visit: Payer: Self-pay

## 2022-09-01 DIAGNOSIS — N401 Enlarged prostate with lower urinary tract symptoms: Secondary | ICD-10-CM | POA: Diagnosis not present

## 2022-09-01 DIAGNOSIS — I10 Essential (primary) hypertension: Secondary | ICD-10-CM | POA: Insufficient documentation

## 2022-09-01 DIAGNOSIS — E785 Hyperlipidemia, unspecified: Secondary | ICD-10-CM | POA: Insufficient documentation

## 2022-09-01 DIAGNOSIS — Z8616 Personal history of COVID-19: Secondary | ICD-10-CM | POA: Diagnosis not present

## 2022-09-01 DIAGNOSIS — N138 Other obstructive and reflux uropathy: Secondary | ICD-10-CM | POA: Insufficient documentation

## 2022-09-01 DIAGNOSIS — I255 Ischemic cardiomyopathy: Secondary | ICD-10-CM | POA: Diagnosis not present

## 2022-09-01 DIAGNOSIS — Z87891 Personal history of nicotine dependence: Secondary | ICD-10-CM | POA: Insufficient documentation

## 2022-09-01 DIAGNOSIS — Z8551 Personal history of malignant neoplasm of bladder: Secondary | ICD-10-CM | POA: Diagnosis not present

## 2022-09-01 DIAGNOSIS — I714 Abdominal aortic aneurysm, without rupture, unspecified: Secondary | ICD-10-CM | POA: Insufficient documentation

## 2022-09-01 DIAGNOSIS — Z951 Presence of aortocoronary bypass graft: Secondary | ICD-10-CM | POA: Diagnosis not present

## 2022-09-01 DIAGNOSIS — I739 Peripheral vascular disease, unspecified: Secondary | ICD-10-CM | POA: Diagnosis not present

## 2022-09-01 DIAGNOSIS — I2510 Atherosclerotic heart disease of native coronary artery without angina pectoris: Secondary | ICD-10-CM | POA: Insufficient documentation

## 2022-09-01 DIAGNOSIS — J45909 Unspecified asthma, uncomplicated: Secondary | ICD-10-CM | POA: Insufficient documentation

## 2022-09-01 HISTORY — PX: CYSTOSCOPY WITH INSERTION OF UROLIFT: SHX6678

## 2022-09-01 SURGERY — CYSTOSCOPY WITH INSERTION OF UROLIFT
Anesthesia: General

## 2022-09-01 MED ORDER — CHLORHEXIDINE GLUCONATE 0.12 % MT SOLN
OROMUCOSAL | Status: AC
  Filled 2022-09-01: qty 15

## 2022-09-01 MED ORDER — MIDAZOLAM HCL 5 MG/5ML IJ SOLN
INTRAMUSCULAR | Status: DC | PRN
  Administered 2022-09-01: 2 mg via INTRAVENOUS

## 2022-09-01 MED ORDER — STERILE WATER FOR IRRIGATION IR SOLN
Status: DC | PRN
  Administered 2022-09-01: 2500 mL

## 2022-09-01 MED ORDER — MIDAZOLAM HCL 2 MG/2ML IJ SOLN
INTRAMUSCULAR | Status: AC
  Filled 2022-09-01: qty 2

## 2022-09-01 MED ORDER — LIDOCAINE 2% (20 MG/ML) 5 ML SYRINGE
INTRAMUSCULAR | Status: DC | PRN
  Administered 2022-09-01: 25 mg via INTRAVENOUS

## 2022-09-01 MED ORDER — HYDROCODONE-ACETAMINOPHEN 5-325 MG PO TABS: 1.0000 | ORAL_TABLET | Freq: Four times a day (QID) | ORAL | 0 refills | Status: DC | PRN

## 2022-09-01 MED ORDER — LIDOCAINE HCL (PF) 2 % IJ SOLN
INTRAMUSCULAR | Status: AC
  Filled 2022-09-01: qty 5

## 2022-09-01 MED ORDER — CEFAZOLIN SODIUM-DEXTROSE 2-4 GM/100ML-% IV SOLN
INTRAVENOUS | Status: AC
  Filled 2022-09-01: qty 100

## 2022-09-01 MED ORDER — FENTANYL CITRATE (PF) 100 MCG/2ML IJ SOLN
INTRAMUSCULAR | Status: AC
  Filled 2022-09-01: qty 2

## 2022-09-01 MED ORDER — GLYCOPYRROLATE 0.2 MG/ML IJ SOLN
INTRAMUSCULAR | Status: AC
  Filled 2022-09-01: qty 1

## 2022-09-01 MED ORDER — GLYCOPYRROLATE 0.2 MG/ML IJ SOLN
INTRAMUSCULAR | Status: DC | PRN
  Administered 2022-09-01: .2 mg via INTRAVENOUS

## 2022-09-01 MED ORDER — FENTANYL CITRATE (PF) 100 MCG/2ML IJ SOLN
INTRAMUSCULAR | Status: DC | PRN
  Administered 2022-09-01 (×2): 50 ug via INTRAVENOUS

## 2022-09-01 MED ORDER — PROPOFOL 10 MG/ML IV BOLUS
INTRAVENOUS | Status: AC
  Filled 2022-09-01: qty 20

## 2022-09-01 MED ORDER — PROPOFOL 10 MG/ML IV BOLUS
INTRAVENOUS | Status: DC | PRN
  Administered 2022-09-01 (×2): 50 mg via INTRAVENOUS
  Administered 2022-09-01: 20 mg via INTRAVENOUS

## 2022-09-01 SURGICAL SUPPLY — 17 items
BAG DRAIN SIEMENS DORNER NS (MISCELLANEOUS) ×1 IMPLANT
BAG DRN NS LF (MISCELLANEOUS) ×1
GAUZE 4X4 16PLY ~~LOC~~+RFID DBL (SPONGE) ×2 IMPLANT
GLOVE BIO SURGEON STRL SZ 6.5 (GLOVE) ×1 IMPLANT
GOWN STRL REUS W/ TWL LRG LVL3 (GOWN DISPOSABLE) ×2 IMPLANT
GOWN STRL REUS W/TWL LRG LVL3 (GOWN DISPOSABLE) ×2
KIT TURNOVER CYSTO (KITS) ×1 IMPLANT
MANIFOLD NEPTUNE II (INSTRUMENTS) ×1 IMPLANT
PACK CYSTO AR (MISCELLANEOUS) ×1 IMPLANT
SET CYSTO W/LG BORE CLAMP LF (SET/KITS/TRAYS/PACK) ×1 IMPLANT
SURGILUBE 2OZ TUBE FLIPTOP (MISCELLANEOUS) IMPLANT
SYSTEM UROLIFT 2 CART W/ HNDL (Male Continence) ×1 IMPLANT
SYSTEM UROLIFT 2 CARTRIDGE (Male Continence) IMPLANT
TRAP FLUID SMOKE EVACUATOR (MISCELLANEOUS) ×1 IMPLANT
WATER STERILE IRR 1000ML POUR (IV SOLUTION) ×1 IMPLANT
WATER STERILE IRR 3000ML UROMA (IV SOLUTION) ×1 IMPLANT
WATER STERILE IRR 500ML POUR (IV SOLUTION) ×1 IMPLANT

## 2022-09-01 NOTE — Anesthesia Preprocedure Evaluation (Signed)
Anesthesia Evaluation  Patient identified by MRN, date of birth, ID band Patient awake    Reviewed: Allergy & Precautions, NPO status , Patient's Chart, lab work & pertinent test results  History of Anesthesia Complications Negative for: history of anesthetic complications  Airway Mallampati: III  TM Distance: <3 FB Neck ROM: full    Dental  (+) Chipped, Dental Advidsory Given   Pulmonary neg shortness of breath, asthma , Patient abstained from smoking., former smoker   Pulmonary exam normal        Cardiovascular Exercise Tolerance: Good hypertension, (-) angina + CAD, + CABG and + Peripheral Vascular Disease  Normal cardiovascular exam     Neuro/Psych negative neurological ROS  negative psych ROS   GI/Hepatic Neg liver ROS,GERD  Controlled,,  Endo/Other  negative endocrine ROS    Renal/GU      Musculoskeletal  (+) Arthritis ,    Abdominal   Peds  Hematology negative hematology ROS (+)   Anesthesia Other Findings Patient has cardiac clearance for this procedure.   Past Medical History: 08/18/2019: AAA (abdominal aortic aneurysm)     Comment:  a.) AAA duplex 08/18/2019 --> measured 3.1 cm. b.) TTE               04/22/2021: interval increase in size to 3.46 cm x 3.70               cm. No date: Aortic atherosclerosis ([place]) 05/08/2021: Coronary artery disease     Comment:  a.) LHC 05/08/2021: EF 45-50%; 55% pRCA, 95% RV branch,               30% dRCA, 100% OM2, 35% dLCx, 40% mLM, 30% RI, 100% pLAD,              100% mLAD; consult CVTS. b.) 3v CABG 05/29/2021:               LIMA-LAD, SVG-LPL-RI (sequenced) No date: Dyspnea on exertion No date: Erectile dysfunction No date: GERD (gastroesophageal reflux disease) 03/2021: History of 2019 novel coronavirus disease (COVID-19) No date: History of ETOH abuse No date: History of tobacco abuse No date: HLD (hyperlipidemia) No date: Hypertension No date: Ischemic  cardiomyopathy     Comment:  a.) TTE 04/22/2021: mild LV dysfunction with EF 40%;               apical anterior, anteroseptal, inferior, and inferoseptal              akinesis; triv PR, mild MR/TR; G1DD. b.) TTE 08/08/2021:               moderate LV dysfunction with mild LVH; EF 35%; mild LA               enlargement, triv AR/PR, mild MR, mod TR. 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 No date: Vitamin D deficiency  Past Surgical History: 05/29/2021: CORONARY ARTERY BYPASS GRAFT; N/A     Comment:  Procedure: 3v CORONARTY ARTERY BYPASS GRAFT (  LIMA-LAD,               SVG-LPL-RI); Location: [facility]; Surgeon: Sharmon Leyden, [place] [id number]: Raynelle Highland RETROGRADES; Bilateral     Comment:  Procedure: CYSTOSCOPY WITH RETROGRADE PYELOGRAM;                Surgeon: Hollice Espy, MD;  Location: [facility];                Service: Urology;  Laterality: Bilateral; 05/08/2021: LEFT HEART CATH AND CORONARY ANGIOGRAPHY; Left     Comment:  Procedure: LEFT HEART CATH AND CORONARY ANGIOGRAPHY;                Surgeon: Corey Skains, MD;  Location: [place]               [facility];  Service: Cardiovascular;  Laterality: Left; No date: TONSILLECTOMY 07/19/2020: TRANSURETHRAL RESECTION OF BLADDER TUMOR WITH MITOMYCIN- C; N/A     Comment:  Procedure: TRANSURETHRAL RESECTION OF BLADDER TUMOR WITH              Gemcitabine;  Surgeon: Hollice Espy, MD;  Location:               [facility];  Service: Urology;  Laterality: N/A;  BMI    Body Mass Index: 29.84 kg/m      Reproductive/Obstetrics negative OB ROS                              Anesthesia Physical Anesthesia Plan  ASA: 3  Anesthesia Plan: General   Post-op Pain Management:    Induction: Intravenous  PONV Risk Score and Plan: 3 and Dexamethasone, Ondansetron, Midazolam, Treatment may vary due to age or medical condition, TIVA and Propofol infusion  Airway Management Planned: Nasal Cannula and Natural Airway  Additional Equipment:   Intra-op Plan:   Post-operative Plan: Extubation in OR  Informed Consent: I have reviewed the patients History and Physical, chart, labs and discussed the procedure including the risks, benefits and alternatives for the proposed anesthesia with the patient or authorized representative who has indicated his/her understanding and acceptance.     Dental Advisory Given  Plan Discussed with: Anesthesiologist, CRNA and Surgeon  Anesthesia Plan Comments: (Patient consented for risks of anesthesia including but not limited to:  - adverse reactions to medications - damage to eyes, teeth, lips or other oral mucosa - nerve damage due to positioning  - sore throat or hoarseness - Damage to heart, brain, nerves, lungs, other parts of body or loss of life  Patient voiced understanding.)        Anesthesia Quick Evaluation

## 2022-09-01 NOTE — Op Note (Signed)
Preoperative diagnosis: BPH with obstructive symptomatology   Postoperative diagnosis: BPH with obstructive symptomatology   Principal procedure: Urolift procedure, with the placement of 7 implants (8 attempted, 7 successfully deployed)   Surgeon: Hollice Espy   Anesthesia: MAC   Complications: None   Drains: None   Estimated blood loss: < 5 mL   Indications: 72 year-old male with obstructive symptomatology secondary to BPH.  The patient's symptoms have progressed, and he has requested further management.  Management options including TURP with resection/ablation of the prostate as well as Urolift were discussed.  The patient has chosen to have a Urolift procedure.  He has been instructed to the procedure as well as risks and complications which include but are not limited to infection, bleeding, and inadequate treatment with the Urolift procedure alone, anesthetic complications, among others.  He understands these and desires to proceed.   Findings: Using the 17 French cystoscope, urethra and bladder were inspected.  There were no urethral lesions.  Prostatic urethra was obstructed secondary to bilobar hypertrophy.  The bladder was inspected circumferentially.  This revealed normal findings.   Description of procedure: The patient was properly identified in the holding area.  He received preoperative IV antibiotics.  He was taken to the operating room where MAC.  He is placed in the dorsolithotomy position.  Genitalia and perineum were prepped and draped.  Proper timeout was performed.   A 45F cystoscope was inserted into the bladder with findings as described above.  The 1st pair of implants were placed at the bladder neck ~1.5 cm from the bladder Neck. The 2nd pair of implants were placed at the level of the verumontanum.   A repeat cysto was performed and another pair of implants in between the prior pairs.  The mid prostate clip on the right deployed but then misfired and pulled  through and thus this particular implant was redone and ultimately successfully deployed.   A final cystoscopy was conducted first to inspect the location and state of each implant and second, to confirm the presence of a continuous anterior channel was present through the prostatic urethra with irrigation flow turned off.   There was 1 nodular area on the left side distally within the urethra which was obstructing to the level of the verumontanum.  This obstructing nodule was tacked over to the side this over with a seventh final implant.   8 implants were attempted, 7 implants were delivered / remained in total.    Following this, the scope was removed.  After anesthetic reversal he was transported to the PACU in stable condition.  He tolerated the procedure well.   Plan: He will follow-up with me in 4 to 6 weeks for IPSS/PVR.

## 2022-09-01 NOTE — Anesthesia Postprocedure Evaluation (Signed)
Anesthesia Post Note  Patient: Christian Pena  Procedure(s) Performed: CYSTOSCOPY WITH INSERTION OF UROLIFT  Patient location during evaluation: PACU Anesthesia Type: General Level of consciousness: awake and alert Pain management: pain level controlled Vital Signs Assessment: post-procedure vital signs reviewed and stable Respiratory status: spontaneous breathing, nonlabored ventilation, respiratory function stable and patient connected to nasal cannula oxygen Cardiovascular status: blood pressure returned to baseline and stable Postop Assessment: no apparent nausea or vomiting Anesthetic complications: no  No notable events documented.   Last Vitals:  Vitals:   09/01/22 1200 09/01/22 1223  BP: 114/77 127/83  Pulse: 69 65  Resp: 15 20  Temp:  (!) 36 C  SpO2: 94% 96%    Last Pain:  Vitals:   09/01/22 1223  TempSrc: Temporal  PainSc: 0-No pain                 Dimas Millin

## 2022-09-01 NOTE — Discharge Instructions (Addendum)
Urolift Post-Operative Instructions     Patient Expectations   1. Mild blood in your urine for about 1 week.  2. Urinary buring, frequency, and urgency for 10 days.  3. Mild pelvic pain 1-2 weeks.  AMBULATORY SURGERY  DISCHARGE INSTRUCTIONS   The drugs that you were given will stay in your system until tomorrow so for the next 24 hours you should not:  Drive an automobile Make any legal decisions Drink any alcoholic beverage   You may resume regular meals tomorrow.  Today it is better to start with liquids and gradually work up to solid foods.  You may eat anything you prefer, but it is better to start with liquids, then soup and crackers, and gradually work up to solid foods.   Please notify your doctor immediately if you have any unusual bleeding, trouble breathing, redness and pain at the surgery site, drainage, fever, or pain not relieved by medication.    Additional Instructions:        Please contact your physician with any problems or Same Day Surgery at [phone], Monday through Friday 6 am to 4 pm, or [place] at [facility] number at [phone].    Return to Activity     1. Drink water post procedure.  2. Take meds as needed.  Tylenol and/or Motrin is most helpful.  You may also by Pyridium/Azo over-the-counter for urinary burning.  3. No lifting or straining 48hrs.  4. Other activity when they feel up to it.

## 2022-09-01 NOTE — Interval H&P Note (Signed)
History and Physical Interval Note:  09/01/2022 10:37 AM  Christian Pena  has presented today for surgery, with the diagnosis of Benign Prostatic Hyperplasia with Urinary Obstruction.  The various methods of treatment have been discussed with the patient and family. After consideration of risks, benefits and other options for treatment, the patient has consented to  Procedure(s): CYSTOSCOPY WITH INSERTION OF UROLIFT (N/A) as a surgical intervention.  The patient's history has been reviewed, patient examined, no change in status, stable for surgery.  I have reviewed the patient's chart and labs.  Questions were answered to the patient's satisfaction.    RRR CTAB  Hollice Espy

## 2022-09-01 NOTE — Transfer of Care (Signed)
Immediate Anesthesia Transfer of Care Note  Patient: Christian Pena  Procedure(s) Performed: CYSTOSCOPY WITH INSERTION OF UROLIFT  Patient Location: PACU  Anesthesia Type:General  Level of Consciousness: awake  Airway & Oxygen Therapy: Patient Spontanous Breathing  Post-op Assessment: Report given to RN and Post -op Vital signs reviewed and stable  Post vital signs: Reviewed  Last Vitals:  Vitals Value Taken Time  BP    Temp    Pulse 70   Resp 12   SpO2 93     Last Pain:  Vitals:   09/01/22 0929  TempSrc: Temporal  PainSc: 0-No pain         Complications: No notable events documented.

## 2022-09-02 ENCOUNTER — Encounter: Payer: Self-pay | Admitting: Urology

## 2022-09-04 ENCOUNTER — Other Ambulatory Visit: Payer: Self-pay | Admitting: Nurse Practitioner

## 2022-09-04 DIAGNOSIS — I714 Abdominal aortic aneurysm, without rupture, unspecified: Secondary | ICD-10-CM

## 2022-09-08 ENCOUNTER — Encounter: Payer: Self-pay | Admitting: Urology

## 2022-09-08 ENCOUNTER — Telehealth: Payer: Self-pay

## 2022-09-08 LAB — CULTURE, URINE COMPREHENSIVE

## 2022-09-08 NOTE — Telephone Encounter (Signed)
Patient had Urolift on 09/01/22. He had blood in the urine, pain, urgency to urinate and these symptoms were almost resolved by 09/06/22. All of a sudden yesterday morning 09/07/22 started back with heavy blood in the urine, urgency to urinate-he has to run to the bathroom when he gets the urge, intense pain with urination. No fever, nausea, vomiting or passing clots at this time. Would like advice on this if this is normal or anything else is needed to be done at this time?

## 2022-09-08 NOTE — Telephone Encounter (Signed)
Patient is scheduled for tomorrow 09/09/22.

## 2022-09-08 NOTE — Telephone Encounter (Signed)
Spoke with patient. See phone note.

## 2022-09-08 NOTE — Telephone Encounter (Signed)
Needs acute UTI visit with Sam or Marvis Repress, MD

## 2022-09-09 ENCOUNTER — Ambulatory Visit: Payer: Medicare HMO | Admitting: Physician Assistant

## 2022-09-09 VITALS — BP 145/90 | HR 78 | Ht 72.0 in | Wt 220.0 lb

## 2022-09-09 DIAGNOSIS — R310 Gross hematuria: Secondary | ICD-10-CM | POA: Diagnosis not present

## 2022-09-09 LAB — URINALYSIS, COMPLETE
Bilirubin, UA: NEGATIVE
Glucose, UA: NEGATIVE
Leukocytes,UA: NEGATIVE
Nitrite, UA: NEGATIVE
Specific Gravity, UA: 1.015 (ref 1.005–1.030)
Urobilinogen, Ur: 1 mg/dL (ref 0.2–1.0)
pH, UA: 5.5 (ref 5.0–7.5)

## 2022-09-09 LAB — MICROSCOPIC EXAMINATION: RBC, Urine: >30 /hpf — AB (ref 0–2)

## 2022-09-09 LAB — BLADDER SCAN AMB NON-IMAGING: Scan Result: 63

## 2022-09-09 MED ORDER — SULFAMETHOXAZOLE-TRIMETHOPRIM 800-160 MG PO TABS: 1.0000 | ORAL_TABLET | Freq: Two times a day (BID) | ORAL | 0 refills | Status: AC

## 2022-09-09 NOTE — Progress Notes (Signed)
09/09/2022 10:55 AM   Christian Pena 1951/07/03 [id number]  CC: Chief Complaint  Patient presents with   Follow-up   Hematuria    HPI: Christian Pena is a 72 y.o. male with PMH bladder cancer and BPH with urinary obstruction s/p UroLift with Dr. Erlene Quan 8 days ago who presents today for evaluation of gross hematuria.   Today he reports initially mild gross hematuria, pink in color, following UroLift.  This acutely worsened yesterday to red urine associated with urinary frequency and passage of clots.  He continues to void spontaneously.  He had some pelvic discomfort immediately postop, however this is resolved.  In-office UA today positive for trace ketones, 3+ blood, and 3+ protein; urine microscopy with >30 RBCs/HPF, granular casts, and moderate bacteria. PVR 45m.  PMH: Past Medical History:  Diagnosis Date   [place]/18[place]   a.) AAA duplex 08/18/2019 --> measured 3.1 cm. b.) TTE 04/22/2021: interval increase in size to 3.46 cm x 3.70 cm.   Aortic atherosclerosis (HCC)    BPH with urinary obstruction    Coronary artery disease 05/08/2021   a.) LHC 05/08/2021: EF 45-50%; 55% pRCA, 95% RV branch, 30% dRCA, 100% OM2, 35% dLCx, 40% mLM, 30% RI, 100% pLAD, 100% mLAD; consult CVTS. b.) 3v CABG 05/29/2021: LIMA-LAD, SVG-LPL-RI (sequenced)   Dyspnea on exertion    Erectile dysfunction    Frequent PVCs    GERD (gastroesophageal reflux disease)    H/O orthostatic hypotension    History of 2019 novel coronavirus disease (COVID-19) 03/2021   History of ETOH abuse    History of tobacco abuse    HLD (hyperlipidemia)    Hypertension    Ischemic cardiomyopathy    a.) TTE 04/22/2021: mild LV dysfunction with EF 40%; apical anterior, anteroseptal, inferior, and inferoseptal akinesis; triv PR, mild MR/TR; G1DD. b.) TTE 08/08/2021: moderate LV dysfunction with mild LVH; EF 35%; mild LA enlargement, triv AR/PR, mild MR, mod TR.   Mild intermittent asthma     Osteoarthritis    Peripheral vascular disease ([facility]CC)    S/P CABG x 3 05/29/2021   a.) 3v CABG: LIMA-LAD, SVG-LPL-RI (sequenced)   Urothelial carcinoma of bladder without invasion of muscle ([place] 07/19/2020   a.) Bx (+) high grade non-invasive papillary urothelial carcinoma; no muscularis propria involvement; s.p TURBT with gemcitabine.   Vitamin D deficiency     Surgical History: Past Surgical History:  Procedure Laterality Date   CORONARY ARTERY BYPASS GRAFT N/A 05/29/2021   Procedure: 3v CORONARTY ARTERY BYPASS GRAFT (LIMA-LAD, SVG-LPL-RI); Location: [facility]; Surgeon: RSharmon Leyden MD   CYSTOSCOPY W/ RETROGRADES Bilateral 07/19/2020   Procedure: CYSTOSCOPY WITH RETROGRADE PYELOGRAM;  Surgeon: BHollice Espy MD;  Location: [facility];  Service: Urology;  Laterality: Bilateral;   CYSTOSCOPY W/ RETROGRADES Bilateral 08/19/2021   Procedure: CYSTOSCOPY WITH RETROGRADE PYELOGRAM;  Surgeon: BHollice Espy MD;  Location: [facility];  Service: Urology;  Laterality: Bilateral;   CYSTOSCOPY WITH INSERTION OF UROLIFT N/A 09/01/2022   Procedure: CYSTOSCOPY WITH INSERTION OF UROLIFT;  Surgeon: BHollice Espy MD;  Location: [facility];  Service: Urology;  Laterality: N/A;   LEFT HEART CATH AND CORONARY ANGIOGRAPHY Left 05/08/2021   Procedure: LEFT HEART CATH AND CORONARY ANGIOGRAPHY;  Surgeon: KCorey Skains MD;  [facility];  Service: Cardiovascular;  Laterality: Left;   TONSILLECTOMY     TRANSURETHRAL RESECTION OF BLADDER TUMOR N/A 08/19/2021   Procedure: TRANSURETHRAL RESECTION OF BLADDER TUMOR (TURBT) WITH INSTILLATION OF GEMCITABINE;  Surgeon:  Hollice Espy, MD;  Location: [facility];  Service: Urology;  Laterality: N/A;   TRANSURETHRAL RESECTION OF BLADDER TUMOR WITH MITOMYCIN-C N/A 07/19/2020   Procedure: TRANSURETHRAL RESECTION OF BLADDER TUMOR WITH Gemcitabine;  Surgeon: Hollice Espy, MD;  Location: [facility];  Service: Urology;  Laterality: N/A;    Home Medications:   Allergies as of 09/09/2022   No Known Allergies      Medication List        Accurate as of September 09, 2022 10:55 AM. If you have any questions, ask your nurse or doctor.          aspirin EC 81 MG tablet Take 81 mg by mouth daily.   HYDROcodone-acetaminophen 5-325 MG tablet Commonly known as: NORCO/VICODIN Take 1-2 tablets by mouth every 6 (six) hours as needed for moderate pain.   metoprolol succinate 25 MG 24 hr tablet Commonly known as: TOPROL-XL Take 12.5 mg by mouth every morning.   multivitamin with minerals tablet Take 1 tablet by mouth daily. Centrum silver Adult 50 +   nicotine polacrilex 4 MG lozenge Commonly known as: COMMIT Take 4 mg by mouth as needed for smoking cessation.   omeprazole 20 MG capsule Commonly known as: PRILOSEC Take 20 mg by mouth every morning.   rosuvastatin 40 MG tablet Commonly known as: CRESTOR Take 40 mg by mouth every morning.   sulfamethoxazole-trimethoprim 800-160 MG tablet Commonly known as: BACTRIM DS Take 1 tablet by mouth 2 (two) times daily for 7 days.   tamsulosin 0.4 MG Caps capsule Commonly known as: FLOMAX Take 1 capsule (0.4 mg total) by mouth daily. What changed: when to take this   valACYclovir 1000 MG tablet Commonly known as: VALTREX Take 1,000 mg by mouth 2 (two) times daily as needed.   WELLBUTRIN PO Take 1 tablet by mouth 2 (two) times daily. Takes for Smoking Cessation        Allergies:  No Known Allergies  Family History: No family history on file.  Social History:   reports that he quit smoking about 4 months ago. His smoking use included cigarettes. He has a 50.00 pack-year smoking history. He has never used smokeless tobacco. He reports current alcohol use of about 4.0 - 5.0 standard drinks of alcohol per week. He reports that he does not use drugs.  Physical Exam: BP (!) 145/90   Pulse 78   Ht 6' (1.829 m)   Wt 220 lb (99.8 kg)   BMI 29.84 kg/m   Constitutional:  Alert and  oriented, no acute distress, nontoxic appearing HEENT: [place], AT Cardiovascular: No clubbing, cyanosis, or edema Respiratory: Normal respiratory effort, no increased work of breathing Skin: No rashes, bruises or suspicious lesions Neurologic: Grossly intact, no focal deficits, moving all 4 extremities Psychiatric: Normal mood and affect  Laboratory Data: Results for orders placed or performed in visit on 09/09/22  Microscopic Examination   Urine  Result Value Ref Range   WBC, UA 0-5 0 - 5 /hpf   RBC, Urine >30 (A) 0 - 2 /hpf   Epithelial Cells (non renal) 0-10 0 - 10 /hpf   Casts Present (A) None seen /lpf   Cast Type Granular casts (A) N/A   Mucus, UA Present (A) Not Estab.   Bacteria, UA Moderate (A) None seen/Few  Urinalysis, Complete  Result Value Ref Range   Specific Gravity, UA 1.015 1.005 - 1.030   pH, UA 5.5 5.0 - 7.5   Color, UA Red (A) Yellow   Appearance Ur Cloudy (  A) Clear   Leukocytes,UA Negative Negative   Protein,UA 3+ (A) Negative/Trace   Glucose, UA Negative Negative   Ketones, UA Trace (A) Negative   RBC, UA 3+ (A) Negative   Bilirubin, UA Negative Negative   Urobilinogen, Ur 1.0 0.2 - 1.0 mg/dL   Nitrite, UA Negative Negative   Microscopic Examination See below:   BLADDER SCAN AMB NON-IMAGING  Result Value Ref Range   Scan Result 63 ml    Assessment & Plan:   1. Gross hematuria UA today with hematuria and bacteriuria.  We discussed normal postoperative findings including gross hematuria, which can fluctuate in severity, pelvic discomfort, dysuria, urgency, and urinary leakage.  Will start empiric Bactrim and send for culture out of an abundance of caution, though overall my suspicion for UTI is rather low in the absence of pyuria.  On visual inspection of his urine, it is translucent and runny with old appearing blood.  I do not think his gross hematuria represents clinically significant bleeding and I reassured him that this should resolve on its own.  He  expressed understanding.  We discussed warning signs of gross hematuria including passage of large clots, passage of thick, ketchup like urine, and the inability to void. - Urinalysis, Complete - BLADDER SCAN AMB NON-IMAGING - CULTURE, URINE COMPREHENSIVE - sulfamethoxazole-trimethoprim (BACTRIM DS) 800-160 MG tablet; Take 1 tablet by mouth 2 (two) times daily for 7 days.  Dispense: 14 tablet; Refill: 0   Return if symptoms worsen or fail to improve.  Debroah Loop, PA-C  [facility] [address] [place], [place] [address] [phone]

## 2022-09-11 ENCOUNTER — Ambulatory Visit
Admission: RE | Admit: 2022-09-11 | Discharge: 2022-09-11 | Disposition: A | Discharge status: Home / Self Care | Payer: Medicare HMO | Source: Ambulatory Visit | Attending: Nurse Practitioner | Admitting: Nurse Practitioner

## 2022-09-11 DIAGNOSIS — I714 Abdominal aortic aneurysm, without rupture, unspecified: Secondary | ICD-10-CM | POA: Insufficient documentation

## 2022-09-11 DIAGNOSIS — Z136 Encounter for screening for cardiovascular disorders: Secondary | ICD-10-CM | POA: Insufficient documentation

## 2022-09-11 DIAGNOSIS — Z87891 Personal history of nicotine dependence: Secondary | ICD-10-CM | POA: Diagnosis not present

## 2022-09-12 LAB — CULTURE, URINE COMPREHENSIVE

## 2022-10-03 ENCOUNTER — Ambulatory Visit: Payer: Medicare HMO | Admitting: Urology

## 2022-10-03 VITALS — BP 185/125 | HR 79 | Ht 72.0 in | Wt 222.0 lb

## 2022-10-03 DIAGNOSIS — N138 Other obstructive and reflux uropathy: Secondary | ICD-10-CM

## 2022-10-03 DIAGNOSIS — N401 Enlarged prostate with lower urinary tract symptoms: Secondary | ICD-10-CM

## 2022-10-03 DIAGNOSIS — R310 Gross hematuria: Secondary | ICD-10-CM | POA: Diagnosis not present

## 2022-10-03 LAB — BLADDER SCAN AMB NON-IMAGING: Scan Result: 16

## 2022-10-03 NOTE — Progress Notes (Signed)
Christian Pena,acting as a scribe for Christian Espy, MD.,have documented all relevant documentation on the behalf of Christian Espy, MD,as directed by  Christian Espy, MD while in the presence of Christian Espy, MD.  10/03/2022 9:01 AM   Christian Pena 06-12-1951 [id number]  Referring provider: Sallee Lange, NP [address] Christian Pena,  [place] [address]  Chief Complaint  Patient presents with   Post-op Follow-up    HPI: 72 year-old male who is 6 weeks status post Urolift who returns today for follow up. He also has a personal history of bladder cancer and is on active surveillance.  Today, he reports a significant improvement in his nocturia, with some nights not needing to get up at all. However, he expresses concern about a weaker urinary stream since discontinuing Flomax (Tamsulosin) on Monday. Despite this, he notes he is emptying his bladder well without urgency or frequency and prefers to remain off Flomax.  Additionally, Christian Pena reports experiencing lower back pain when standing in one place for extended periods, which he suspects is unrelated to his urological condition.   Results for orders placed or performed in visit on 10/03/22  Bladder Scan (Post Void Residual) in office  Result Value Ref Range   Scan Result 16 ml     IPSS     Row Name 10/03/22 0800         International Prostate Symptom Score   How often have you had the sensation of not emptying your bladder? Not at All     How often have you had to urinate less than every two hours? Less than 1 in 5 times     How often have you found you stopped and started again several times when you urinated? Not at All     How often have you found it difficult to postpone urination? Not at All     How often have you had a weak urinary stream? Less than half the time     How often have you had to strain to start urination? Not at All     How many times did you typically get up at night to urinate? 1 Time      Total IPSS Score 4       Quality of Life due to urinary symptoms   If you were to spend the rest of your life with your urinary condition just the way it is now how would you feel about that? Mostly Satisfied              Score:  1-7 Mild 8-19 Moderate 20-35 Severe    PMH: Past Medical History:  Diagnosis Date   AAA (abdominal aortic aneurysm) ([place]) 08/18/2019   a.) AAA duplex 08/18/2019 --> measured 3.1 cm. b.) TTE 04/22/2021: interval increase in size to 3.46 cm x 3.70 cm.   Aortic atherosclerosis (HCC)    BPH with urinary obstruction    Coronary artery disease 05/08/2021   a.) LHC 05/08/2021: EF 45-50%; 55% pRCA, 95% RV branch, 30% dRCA, 100% OM2, 35% dLCx, 40% mLM, 30% RI, 100% pLAD, 100% mLAD; consult CVTS. b.) 3v CABG 05/29/2021: LIMA-LAD, SVG-LPL-RI (sequenced)   Dyspnea on exertion    Erectile dysfunction    Frequent PVCs    GERD (gastroesophageal reflux disease)    H/O orthostatic hypotension    History of 2019 novel coronavirus disease (COVID-19) 03/2021   History of ETOH abuse    History of tobacco abuse    HLD (  hyperlipidemia)    Hypertension    Ischemic cardiomyopathy    a.) TTE 04/22/2021: mild LV dysfunction with EF 40%; apical anterior, anteroseptal, inferior, and inferoseptal akinesis; triv PR, mild MR/TR; G1DD. b.) TTE 08/08/2021: moderate LV dysfunction with mild LVH; EF 35%; mild LA enlargement, triv AR/PR, mild MR, mod TR.   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   Vitamin D deficiency     Surgical History: Past Surgical History:  Procedure Laterality Date   CORONARY ARTERY BYPASS GRAFT N/A 05/29/2021   Procedure: 3v  CORONARTY ARTERY BYPASS GRAFT (LIMA-LAD, SVG-LPL-RI); Location: [facility]; Surgeon: Christian Leyden, MD   CYSTOSCOPY W/ RETROGRADES Bilateral 07/19/2020   Procedure: CYSTOSCOPY WITH RETROGRADE PYELOGRAM;  Surgeon: Christian Espy, MD;  Location: [facility];  Service: Urology;  Laterality: Bilateral;   CYSTOSCOPY W/ RETROGRADES Bilateral 08/19/2021   Procedure: CYSTOSCOPY WITH RETROGRADE PYELOGRAM;  Surgeon: Christian Espy, MD;  Location: [facility];  Service: Urology;  Laterality: Bilateral;   CYSTOSCOPY WITH INSERTION OF UROLIFT N/A 09/01/2022   Procedure: CYSTOSCOPY WITH INSERTION OF UROLIFT;  Surgeon: Christian Espy, MD;  Location: [facility];  Service: Urology;  Laterality: N/A;   LEFT HEART CATH AND CORONARY ANGIOGRAPHY Left 05/08/2021   Procedure: LEFT HEART CATH AND CORONARY ANGIOGRAPHY;  Surgeon: Christian Skains, MD;  Location: [facility];  Service: Cardiovascular;  Laterality: Left;   TONSILLECTOMY     TRANSURETHRAL RESECTION OF BLADDER TUMOR N/A 08/19/2021   Procedure: TRANSURETHRAL RESECTION OF BLADDER TUMOR (TURBT) WITH INSTILLATION OF GEMCITABINE;  Surgeon: Christian Espy, MD;  Location: [facility];  Service: Urology;  Laterality: N/A;   TRANSURETHRAL RESECTION OF BLADDER TUMOR WITH MITOMYCIN-C N/A 07/19/2020   Procedure: TRANSURETHRAL RESECTION OF BLADDER TUMOR WITH Gemcitabine;  Surgeon: Christian Espy, MD;  Location: [facility];  Service: Urology;  Laterality: N/A;    Home Medications:  Allergies as of 10/03/2022   No Known Allergies      Medication List        Accurate as of October 03, 2022  9:01 AM. If you have any questions, ask your nurse or doctor.          STOP taking these medications    HYDROcodone-acetaminophen 5-325 MG tablet Commonly known as: NORCO/VICODIN   WELLBUTRIN PO       TAKE these medications    aspirin EC 81 MG tablet Take 81 mg by mouth daily.   metoprolol succinate 25 MG 24 hr tablet Commonly known as: TOPROL-XL Take 12.5 mg by mouth  every morning.   multivitamin with minerals tablet Take 1 tablet by mouth daily. Centrum silver Adult 50 +   nicotine polacrilex 4 MG lozenge Commonly known as: COMMIT Take 4 mg by mouth as needed for smoking cessation.   omeprazole 20 MG capsule Commonly known as: PRILOSEC Take 20 mg by mouth every morning.   rosuvastatin 40 MG tablet Commonly known as: CRESTOR Take 40 mg by mouth every morning.   tamsulosin 0.4 MG Caps capsule Commonly known as: FLOMAX Take 1 capsule (0.4 mg total) by mouth daily. What changed: when to take this   valACYclovir 1000 MG tablet Commonly known as: VALTREX Take 1,000 mg by mouth 2 (two)  times daily as needed.        Social History:  reports that he quit smoking about 5 months ago. His smoking use included cigarettes. He has a 50.00 pack-year smoking history. He has never used smokeless tobacco. He reports current alcohol use of about 4.0 - 5.0 standard drinks of alcohol per week. He reports that he does not use drugs.   Physical Exam: BP (!) 185/125   Pulse 79   Ht 6' (1.829 m)   Wt 222 lb (100.7 kg)   BMI 30.11 kg/m   Constitutional:  Alert and oriented, No acute distress. HEENT: [place] AT, moist mucus membranes.  Trachea midline, no masses. Neurologic: Grossly intact, no focal deficits, moving all 4 extremities. Psychiatric: Normal mood and affect.    Assessment & Plan:    1. BPH with urinary obstruction - Satisfactory post-operative progress with improved nocturia. He experiences a weaker urinary stream after discontinuing Flomax, which he opted to stop due to personal preference. - Continue to monitor urinary symptoms. He may resume Flomax as needed if urinary stream worsens or other symptoms develop.  2. Gross hematuria - Likely post-operative due to scab formation and sloughing within the prostate. - He was reassured that this is a common occurrence postoperatively and typically resolves on its own.   Return for scheduled  surveillance cystoscopy.  I have reviewed the above documentation for accuracy and completeness, and I agree with the above.   Christian Espy, MD    [facility] [address] [place], [place] [address] [phone]

## 2023-02-25 ENCOUNTER — Ambulatory Visit: Payer: Medicare HMO | Admitting: Urology

## 2023-02-25 ENCOUNTER — Encounter: Payer: Self-pay | Admitting: Urology

## 2023-02-25 VITALS — BP 132/82 | HR 79 | Wt 222.0 lb

## 2023-02-25 DIAGNOSIS — Z8551 Personal history of malignant neoplasm of bladder: Secondary | ICD-10-CM | POA: Diagnosis not present

## 2023-02-25 DIAGNOSIS — R351 Nocturia: Secondary | ICD-10-CM

## 2023-02-25 DIAGNOSIS — N138 Other obstructive and reflux uropathy: Secondary | ICD-10-CM

## 2023-02-25 DIAGNOSIS — N401 Enlarged prostate with lower urinary tract symptoms: Secondary | ICD-10-CM

## 2023-02-25 LAB — URINALYSIS, COMPLETE
Bilirubin, UA: NEGATIVE
Glucose, UA: NEGATIVE
Ketones, UA: NEGATIVE
Leukocytes,UA: NEGATIVE
Nitrite, UA: NEGATIVE
RBC, UA: NEGATIVE
Specific Gravity, UA: 1.025 (ref 1.005–1.030)
Urobilinogen, Ur: 0.2 mg/dL (ref 0.2–1.0)
pH, UA: 6 (ref 5.0–7.5)

## 2023-02-25 LAB — MICROSCOPIC EXAMINATION

## 2023-02-25 NOTE — Progress Notes (Signed)
   02/25/23 CC:  Chief Complaint  Patient presents with   Cysto      HPI: Christian Pena is a 72 y.o. 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    He is s/p TURBT on 07/19/2020 that revealed high-grade noninvasive urothelial carcinoma; 1 cm. There were no other bladder lesion identified and the muscularis propria was appreciated and not involved.    Cystoscopy on 07/30/2021 showed <1cm tumor on right bladder wall consistent with a small reoccurrence.    He underwent a TURBT on 08/19/2021. Intraoperative findings showed very small approximately 5 mm superficial appearing tumor on the right posterior bladder wall, no other tumors appreciated.  Unremarkable bilateral retrogrades.  Postprocedural gemcitabine.  Surgical pathology revealed polypoid fragment of reactive urothelial mucosa with lymphoid aggregate and negative malignancy.    He did undergo a 6-week course of induction BCG completed in 09/2020.  This was well-tolerated.   He is s/p BCG x3. His last instillation was on 10/31/2021.   S/p Urolift for urinary symptoms, no off flomax.  Initially at last visit, he reported that things are going extremely well but today he reports that he is not sure it worked.  He is getting up at least 4 times at night to urinate.  He does snore loudly per his wife.  Daytime symptoms occasionally with a weaker stream but otherwise no issues.   Vitals:   02/25/23 0813  BP: 132/82  Pulse: 79   NED. A&Ox3.   No respiratory distress   Abd soft, NT, ND Normal phallus with bilateral descended testicles  Cystoscopy Procedure Note  Patient identification was confirmed, informed consent was obtained, and patient was prepped using Betadine solution.  Lidocaine jelly was administered per urethral meatus.     Pre-Procedure: - Inspection reveals a normal caliber ureteral meatus.  Procedure: The flexible cystoscope was  introduced without difficulty - No urethral strictures/lesions are present. - Enlarged prostate with anterior channel at the level of the apex and bladder neck but coaptation in the mid prostate - Elevated bladder neck - Bilateral ureteral orifices identified - Bladder mucosa  reveals no ulcers, tumors, or lesions - No bladder stones - No trabeculation  Retroflexion shows NED   Post-Procedure: - Patient tolerated the procedure well   Assessment/ Plan:  History of bladder cancer  - Cystoscopy showed NED - Continue surveillance with cysto 6 months  2. BPH with nocturia  -Given primarily nighttime symptoms, would recommend consideration of sleep study, will CC primary care.  May be contributing factor. -Consider restarting Flomax to see if this helps his urinary symptoms, he will let us know  Fu cysto in 6 mo  Vanna Scotland, MD

## 2023-04-20 ENCOUNTER — Ambulatory Visit: Payer: Medicare HMO | Admitting: Anesthesiology

## 2023-04-20 ENCOUNTER — Encounter
Admission: RE | Disposition: A | Discharge status: Home / Self Care | Payer: Self-pay | Source: Home / Self Care | Attending: Gastroenterology

## 2023-04-20 ENCOUNTER — Encounter: Payer: Self-pay | Admitting: *Deleted

## 2023-04-20 ENCOUNTER — Ambulatory Visit
Admission: RE | Admit: 2023-04-20 | Discharge: 2023-04-20 | Disposition: A | Discharge status: Home / Self Care | Payer: Medicare HMO | Attending: Gastroenterology | Admitting: Gastroenterology

## 2023-04-20 ENCOUNTER — Other Ambulatory Visit: Payer: Self-pay

## 2023-04-20 DIAGNOSIS — I2510 Atherosclerotic heart disease of native coronary artery without angina pectoris: Secondary | ICD-10-CM | POA: Insufficient documentation

## 2023-04-20 DIAGNOSIS — D128 Benign neoplasm of rectum: Secondary | ICD-10-CM | POA: Insufficient documentation

## 2023-04-20 DIAGNOSIS — I110 Hypertensive heart disease with heart failure: Secondary | ICD-10-CM | POA: Diagnosis not present

## 2023-04-20 DIAGNOSIS — I739 Peripheral vascular disease, unspecified: Secondary | ICD-10-CM | POA: Insufficient documentation

## 2023-04-20 DIAGNOSIS — K635 Polyp of colon: Secondary | ICD-10-CM | POA: Diagnosis not present

## 2023-04-20 DIAGNOSIS — K5730 Diverticulosis of large intestine without perforation or abscess without bleeding: Secondary | ICD-10-CM | POA: Diagnosis not present

## 2023-04-20 DIAGNOSIS — Z1211 Encounter for screening for malignant neoplasm of colon: Secondary | ICD-10-CM | POA: Insufficient documentation

## 2023-04-20 DIAGNOSIS — K640 First degree hemorrhoids: Secondary | ICD-10-CM | POA: Insufficient documentation

## 2023-04-20 DIAGNOSIS — I5022 Chronic systolic (congestive) heart failure: Secondary | ICD-10-CM | POA: Diagnosis not present

## 2023-04-20 DIAGNOSIS — D123 Benign neoplasm of transverse colon: Secondary | ICD-10-CM | POA: Insufficient documentation

## 2023-04-20 HISTORY — PX: COLONOSCOPY WITH PROPOFOL: SHX5780

## 2023-04-20 HISTORY — PX: POLYPECTOMY: SHX5525

## 2023-04-20 SURGERY — COLONOSCOPY WITH PROPOFOL
Anesthesia: General

## 2023-04-20 MED ORDER — LIDOCAINE HCL (PF) 2 % IJ SOLN
INTRAMUSCULAR | Status: AC
  Filled 2023-04-20: qty 5

## 2023-04-20 MED ORDER — PROPOFOL 1000 MG/100ML IV EMUL
INTRAVENOUS | Status: AC
  Filled 2023-04-20: qty 100

## 2023-04-20 MED ORDER — PROPOFOL 500 MG/50ML IV EMUL
INTRAVENOUS | Status: DC | PRN
  Administered 2023-04-20: 150 ug/kg/min via INTRAVENOUS

## 2023-04-20 MED ORDER — SODIUM CHLORIDE 0.9 % IV SOLN: INTRAVENOUS | Status: DC

## 2023-04-20 MED ORDER — PROPOFOL 10 MG/ML IV BOLUS
INTRAVENOUS | Status: DC | PRN
  Administered 2023-04-20: 100 mg via INTRAVENOUS

## 2023-04-20 NOTE — Op Note (Signed)
[facility] Patient Name: Christian Pena Procedure Date: 04/20/2023 10:00 AM MRN: [id number] Account #: [id number] Date of Birth: [birth year] Admit Type: Outpatient Age: 72 Room: [facility] ENDO ROOM 3 Gender: Male Note Status: Finalized Instrument Name: Prentice Docker [id number] Procedure:             Colonoscopy Indications:           Screening for colorectal malignant neoplasm Providers:             Eather Colas MD, MD Referring MD:          Caryl Asp (Referring MD) Medicines:             Monitored Anesthesia Care Complications:         No immediate complications. Estimated blood loss:                         Minimal. Procedure:             Pre-Anesthesia Assessment:                        - Prior to the procedure, a History and Physical was                         performed, and patient medications and allergies were                         reviewed. The patient is competent. The risks and                         benefits of the procedure and the sedation options and                         risks were discussed with the patient. All questions                         were answered and informed consent was obtained.                         Patient identification and proposed procedure were                         verified by the physician, the nurse, the                         anesthesiologist, the anesthetist and the technician                         in the endoscopy suite. Mental Status Examination:                         alert and oriented. Airway Examination: normal                         oropharyngeal airway and neck mobility. Respiratory                         Examination: clear to auscultation. CV Examination:  normal. Prophylactic Antibiotics: The patient does not                         require prophylactic antibiotics. Prior                         Anticoagulants: The patient has taken no anticoagulant                          or antiplatelet agents. ASA Grade Assessment: III - A                         patient with severe systemic disease. After reviewing                         the risks and benefits, the patient was deemed in                         satisfactory condition to undergo the procedure. The                         anesthesia plan was to use monitored anesthesia care                         (MAC). Immediately prior to administration of                         medications, the patient was re-assessed for adequacy                         to receive sedatives. The heart rate, respiratory                         rate, oxygen saturations, blood pressure, adequacy of                         pulmonary ventilation, and response to care were                         monitored throughout the procedure. The physical                         status of the patient was re-assessed after the                         procedure.                        After obtaining informed consent, the colonoscope was                         passed under direct vision. Throughout the procedure,                         the patient's blood pressure, pulse, and oxygen                         saturations were monitored continuously. The  Colonoscope was introduced through the anus and                         advanced to the the terminal ileum. The colonoscopy                         was performed without difficulty. The patient                         tolerated the procedure well. The quality of the bowel                         preparation was good. The terminal ileum, ileocecal                         valve, appendiceal orifice, and rectum were                         photographed. Findings:      The perianal and digital rectal examinations were normal.      The terminal ileum appeared normal.      A 1 mm polyp was found in the hepatic flexure. The polyp was sessile.       The polyp was removed  with a cold snare. Resection and retrieval were       complete. Estimated blood loss was minimal.      A 3 mm polyp was found in the sigmoid colon. The polyp was sessile. The       polyp was removed with a cold snare. Resection and retrieval were       complete. Estimated blood loss was minimal.      A few small-mouthed diverticula were found in the sigmoid colon.      A 4 mm polyp was found in the rectum. The polyp was sessile. The polyp       was removed with a cold snare. Resection and retrieval were complete.       Estimated blood loss was minimal.      Internal hemorrhoids were found during retroflexion. The hemorrhoids       were Grade I (internal hemorrhoids that do not prolapse).      The exam was otherwise without abnormality on direct and retroflexion       views. Impression:            - The examined portion of the ileum was normal.                        - One 1 mm polyp at the hepatic flexure, removed with                         a cold snare. Resected and retrieved.                        - One 3 mm polyp in the sigmoid colon, removed with a                         cold snare. Resected and retrieved.                        - Diverticulosis in the  sigmoid colon.                        - One 4 mm polyp in the rectum, removed with a cold                         snare. Resected and retrieved.                        - Internal hemorrhoids.                        - The examination was otherwise normal on direct and                         retroflexion views. Recommendation:        - Discharge patient to home.                        - Resume previous diet.                        - Continue present medications.                        - Await pathology results.                        - Repeat colonoscopy for surveillance based on                         pathology results.                        - Return to referring physician as previously                          scheduled. Procedure Code(s):     --- Professional ---                        [id number], Colonoscopy, flexible; with removal of                         tumor(s), polyp(s), or other lesion(s) by snare                         technique Diagnosis Code(s):     --- Professional ---                        Z12.11, Encounter for screening for malignant neoplasm                         of colon                        K64.0, First degree hemorrhoids                        D12.3, Benign neoplasm of transverse colon (hepatic                         flexure or splenic flexure)  D12.5, Benign neoplasm of sigmoid colon                        D12.8, Benign neoplasm of rectum                        K57.30, Diverticulosis of large intestine without                         perforation or abscess without bleeding CPT copyright 2022 American Medical Association. All rights reserved. The codes documented in this report are preliminary and upon coder review may  be revised to meet current compliance requirements. Eather Colas MD, MD 04/20/2023 10:40:22 AM Number of Addenda: 0 Note Initiated On: 04/20/2023 10:00 AM Scope Withdrawal Time: 0 hours 12 minutes 50 seconds  Total Procedure Duration: 0 hours 18 minutes 30 seconds  Estimated Blood Loss:  Estimated blood loss was minimal.      [facility]

## 2023-04-20 NOTE — Anesthesia Preprocedure Evaluation (Signed)
Anesthesia Evaluation  Patient identified by MRN, date of birth, ID band Patient awake    Reviewed: Allergy & Precautions, H&P , NPO status , Patient's Chart, lab work & pertinent test results, reviewed documented beta blocker date and time   Airway Mallampati: II   Neck ROM: full    Dental  (+) Poor Dentition   Pulmonary asthma , former smoker   Pulmonary exam normal        Cardiovascular Exercise Tolerance: Good hypertension, On Medications + angina  + CAD and + Peripheral Vascular Disease  Normal cardiovascular exam Rhythm:regular Rate:Normal     Neuro/Psych negative neurological ROS  negative psych ROS   GI/Hepatic Neg liver ROS,GERD  ,,  Endo/Other  negative endocrine ROS    Renal/GU negative Renal ROS  negative genitourinary   Musculoskeletal   Abdominal   Peds  Hematology negative hematology ROS (+)   Anesthesia Other Findings Past Medical History: 08/18/2019: AAA (abdominal aortic aneurysm) (HCC)     Comment:  a.) AAA duplex 08/18/2019 --> measured 3.1 cm. b.) TTE               04/22/2021: interval increase in size to 3.46 cm x 3.70               cm. No date: Aortic atherosclerosis (HCC) No date: BPH with urinary obstruction 05/08/2021: Coronary artery disease     Comment:  a.) LHC 05/08/2021: EF 45-50%; 55% pRCA, 95% RV branch,               30% dRCA, 100% OM2, 35% dLCx, 40% mLM, 30% RI, 100% pLAD,              100% mLAD; consult CVTS. b.) 3v CABG 05/29/2021:               LIMA-LAD, SVG-LPL-RI (sequenced) No date: Dyspnea on exertion No date: Erectile dysfunction No date: Frequent PVCs No date: GERD (gastroesophageal reflux disease) No date: H/O orthostatic hypotension 03/2021: History of 2019 novel coronavirus disease (COVID-19) No date: History of ETOH abuse No date: History of tobacco abuse No date: HLD (hyperlipidemia) No date: Hypertension No date: Ischemic cardiomyopathy     Comment:   a.) TTE 04/22/2021: mild LV dysfunction with EF 40%;               apical anterior, anteroseptal, inferior, and inferoseptal              akinesis; triv PR, mild MR/TR; G1DD. b.) TTE 08/08/2021:               moderate LV dysfunction with mild LVH; EF 35%; mild LA               enlargement, triv AR/PR, mild MR, mod TR. No date: Mild intermittent asthma No date: Osteoarthritis No date: Peripheral vascular disease (HCC) 05/29/2021: S/P CABG x 3     Comment:  a.) 3v CABG: LIMA-LAD, SVG-LPL-RI (sequenced) 07/19/2020: Urothelial carcinoma of bladder without invasion of  muscle (HCC)     Comment:  a.) Bx (+) high grade non-invasive papillary urothelial               carcinoma; no muscularis propria involvement; s.p TURBT               with gemcitabine. No date: Vitamin D deficiency Past Surgical History: 05/29/2021: CORONARY ARTERY BYPASS GRAFT; N/A     Comment:  Procedure: 3v CORONARTY ARTERY BYPASS GRAFT (LIMA-LAD,  SVG-LPL-RI); Location: [facility]; Surgeon: Flint Melter, MD 07/19/2020: [address] RETROGRADES; Bilateral     Comment:  Procedure: CYSTOSCOPY WITH RETROGRADE PYELOGRAM;                Surgeon: Vanna Scotland, MD;  Location: [facility];                Service: Urology;  Laterality: Bilateral; 08/19/2021: CYSTOSCOPY W/ RETROGRADES; Bilateral     Comment:  Procedure: CYSTOSCOPY WITH RETROGRADE PYELOGRAM;                Surgeon: Vanna Scotland, MD;  Location: [facility];                Service: Urology;  Laterality: Bilateral; 09/01/2022: CYSTOSCOPY WITH INSERTION OF UROLIFT; N/A     Comment:  Procedure: CYSTOSCOPY WITH INSERTION OF UROLIFT;                Surgeon: Vanna Scotland, MD;  Location: [facility];                Service: Urology;  Laterality: N/A; 05/08/2021: LEFT HEART CATH AND CORONARY ANGIOGRAPHY; Left     Comment:  Procedure: LEFT HEART CATH AND CORONARY ANGIOGRAPHY;                Surgeon: Lamar Blinks, MD;  Location: [facility]               [facility];   Service: Cardiovascular;  Laterality: Left; No date: TONSILLECTOMY 08/19/2021: TRANSURETHRAL RESECTION OF BLADDER TUMOR; N/A     Comment:  Procedure: TRANSURETHRAL RESECTION OF BLADDER TUMOR               (TURBT) WITH INSTILLATION OF GEMCITABINE;  Surgeon:               Vanna Scotland, MD;  Location: [facility];  Service:               Urology;  Laterality: N/A; 07/19/2020: TRANSURETHRAL RESECTION OF BLADDER TUMOR WITH MITOMYCIN- C; N/A     Comment:  Procedure: TRANSURETHRAL RESECTION OF BLADDER TUMOR WITH              Gemcitabine;  Surgeon: Vanna Scotland, MD;  Location:               [facility];  Service: Urology;  Laterality: N/A; BMI    Body Mass Index: 29.84 kg/m     Reproductive/Obstetrics negative OB ROS                             Anesthesia Physical Anesthesia Plan  ASA: 3  Anesthesia Plan: General   Post-op Pain Management:    Induction:   PONV Risk Score and Plan:   Airway Management Planned:   Additional Equipment:   Intra-op Plan:   Post-operative Plan:   Informed Consent: I have reviewed the patients History and Physical, chart, labs and discussed the procedure including the risks, benefits and alternatives for the proposed anesthesia with the patient or authorized representative who has indicated his/her understanding and acceptance.     Dental Advisory Given  Plan Discussed with: CRNA  Anesthesia Plan Comments:        Anesthesia Quick Evaluation

## 2023-04-20 NOTE — H&P (Signed)
Outpatient short stay form Pre-procedure 04/20/2023  Christian Bill, MD  Primary Physician: Myrene Buddy, NP  Reason for visit:  Screening  History of present illness:    72 y/o gentleman with history of CAD, HFrEF, and BPH here for screening colonoscopy. Had normal colonoscopy 10 years ago. No family history of GI malignancies. No blood thinners. No significant abdominal surgeries.    Current Facility-Administered Medications:    0.9 %  sodium chloride infusion, , Intravenous, Continuous, Quilla Freeze, Rossie Muskrat, MD, Last Rate: 20 mL/hr at 04/20/23 0943, Continued from Pre-op at 04/20/23 0943  Medications Prior to Admission  Medication Sig Dispense Refill Last Dose   aspirin 81 MG EC tablet Take 81 mg by mouth daily.   04/19/2023   metoprolol succinate (TOPROL-XL) 25 MG 24 hr tablet Take 12.5 mg by mouth every morning.   04/20/2023 at 0630   Multiple Vitamins-Minerals (MULTIVITAMIN WITH MINERALS) tablet Take 1 tablet by mouth daily. Centrum silver Adult 50 +   Past Week   omeprazole (PRILOSEC) 20 MG capsule Take 20 mg by mouth every morning.   04/19/2023   rosuvastatin (CRESTOR) 40 MG tablet Take 40 mg by mouth every morning.   04/19/2023   valACYclovir (VALTREX) 1000 MG tablet Take 1,000 mg by mouth 2 (two) times daily as needed.   04/19/2023   nicotine polacrilex (COMMIT) 4 MG lozenge Take 4 mg by mouth as needed for smoking cessation.      tamsulosin (FLOMAX) 0.4 MG CAPS capsule Take 1 capsule (0.4 mg total) by mouth daily. (Patient taking differently: Take 0.4 mg by mouth daily after supper.) 90 capsule 3      No Known Allergies   Past Medical History:  Diagnosis Date   AAA (abdominal aortic aneurysm) (HCC) 08/18/2019   a.) AAA duplex 08/18/2019 --> measured 3.1 cm. b.) TTE 04/22/2021: interval increase in size to 3.46 cm x 3.70 cm.   Aortic atherosclerosis (HCC)    BPH with urinary obstruction    Coronary artery disease 05/08/2021   a.) LHC 05/08/2021: EF 45-50%; 55%  pRCA, 95% RV branch, 30% dRCA, 100% OM2, 35% dLCx, 40% mLM, 30% RI, 100% pLAD, 100% mLAD; consult CVTS. b.) 3v CABG 05/29/2021: LIMA-LAD, SVG-LPL-RI (sequenced)   Dyspnea on exertion    Erectile dysfunction    Frequent PVCs    GERD (gastroesophageal reflux disease)    H/O orthostatic hypotension    History of 2019 novel coronavirus disease (COVID-19) 03/2021   History of ETOH abuse    History of tobacco abuse    HLD (hyperlipidemia)    Hypertension    Ischemic cardiomyopathy    a.) TTE 04/22/2021: mild LV dysfunction with EF 40%; apical anterior, anteroseptal, inferior, and inferoseptal akinesis; triv PR, mild MR/TR; G1DD. b.) TTE 08/08/2021: moderate LV dysfunction with mild LVH; EF 35%; mild LA enlargement, triv AR/PR, mild MR, mod TR.   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   Vitamin D deficiency     Review of systems:  Otherwise negative.    Physical Exam  Gen: Alert, oriented. Appears stated age.  HEENT: PERRLA. Lungs: No respiratory distress CV: RRR Abd: soft, benign, no masses Ext: No edema  Planned procedures: Proceed with colonoscopy. The patient understands the nature of the planned procedure, indications, risks, alternatives and potential complications including but not limited to bleeding, infection, perforation, damage to internal organs and possible oversedation/side effects from anesthesia. The patient agrees and gives consent to proceed.  Please refer to procedure notes for findings, recommendations and patient disposition/instructions.     Christian Bill, MD [facility]

## 2023-04-20 NOTE — Interval H&P Note (Signed)
History and Physical Interval Note:  04/20/2023 10:06 AM  Christian Pena  has presented today for surgery, with the diagnosis of CCA SCREEN.  The various methods of treatment have been discussed with the patient and family. After consideration of risks, benefits and other options for treatment, the patient has consented to  Procedure(s): COLONOSCOPY WITH PROPOFOL (N/A) as a surgical intervention.  The patient's history has been reviewed, patient examined, no change in status, stable for surgery.  I have reviewed the patient's chart and labs.  Questions were answered to the patient's satisfaction.     Regis Bill  Ok to proceed with colonoscopy

## 2023-04-20 NOTE — Transfer of Care (Signed)
Immediate Anesthesia Transfer of Care Note  Patient: Christian Pena  Procedure(s) Performed: COLONOSCOPY WITH PROPOFOL  Patient Location: PACU  Anesthesia Type:General  Level of Consciousness: awake, alert , and sedated  Airway & Oxygen Therapy: Patient Spontanous Breathing and Patient connected to nasal cannula oxygen  Post-op Assessment: Report given to RN and Post -op Vital signs reviewed and stable  Post vital signs: Reviewed and stable  Last Vitals:  Vitals Value Taken Time  BP 94/60 04/20/23 1041  Temp    Pulse 60 04/20/23 1041  Resp 13 04/20/23 1041  SpO2 85 % 04/20/23 1041  Vitals shown include unfiled device data.  Last Pain:  Vitals:   04/20/23 0906  TempSrc: Temporal  PainSc: 0-No pain         Complications: There were no known notable events for this encounter.

## 2023-04-21 ENCOUNTER — Encounter: Payer: Self-pay | Admitting: Gastroenterology

## 2023-04-21 LAB — SURGICAL PATHOLOGY

## 2023-04-21 NOTE — Anesthesia Postprocedure Evaluation (Signed)
Anesthesia Post Note  Patient: Christian Pena  Procedure(s) Performed: COLONOSCOPY WITH PROPOFOL  Patient location during evaluation: PACU Anesthesia Type: General Level of consciousness: awake and alert Pain management: pain level controlled Vital Signs Assessment: post-procedure vital signs reviewed and stable Respiratory status: spontaneous breathing, nonlabored ventilation, respiratory function stable and patient connected to nasal cannula oxygen Cardiovascular status: blood pressure returned to baseline and stable Postop Assessment: no apparent nausea or vomiting Anesthetic complications: no   There were no known notable events for this encounter.   Last Vitals:  Vitals:   04/20/23 1048 04/20/23 1058  BP: 123/73 123/83  Pulse: 63 60  Resp: 20 15  Temp:    SpO2: 97% 94%    Last Pain:  Vitals:   04/21/23 0746  TempSrc:   PainSc: 0-No pain                 Yevette Edwards

## 2023-08-25 ENCOUNTER — Ambulatory Visit: Payer: Medicare Other | Admitting: Urology

## 2023-08-25 VITALS — BP 159/83 | HR 61 | Ht 72.0 in | Wt 230.0 lb

## 2023-08-25 DIAGNOSIS — R310 Gross hematuria: Secondary | ICD-10-CM | POA: Diagnosis not present

## 2023-08-25 DIAGNOSIS — Z8551 Personal history of malignant neoplasm of bladder: Secondary | ICD-10-CM

## 2023-08-25 LAB — URINALYSIS, COMPLETE
Bilirubin, UA: NEGATIVE
Glucose, UA: NEGATIVE
Ketones, UA: NEGATIVE
Leukocytes,UA: NEGATIVE
Nitrite, UA: NEGATIVE
Protein,UA: NEGATIVE
RBC, UA: NEGATIVE
Specific Gravity, UA: 1.01 (ref 1.005–1.030)
Urobilinogen, Ur: 0.2 mg/dL (ref 0.2–1.0)
pH, UA: 6 (ref 5.0–7.5)

## 2023-08-25 LAB — MICROSCOPIC EXAMINATION

## 2023-08-25 MED ORDER — LIDOCAINE HCL URETHRAL/MUCOSAL 2 % EX GEL
1.0000 | Freq: Once | CUTANEOUS | Status: AC
Start: 2023-08-25 — End: 2023-08-25
  Administered 2023-08-25: 1 via URETHRAL

## 2023-08-25 NOTE — Progress Notes (Signed)
   08/25/23 CC:  Chief Complaint  Patient presents with   Cysto      HPI: Christian Pena is a 73 y.o. 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    He is s/p TURBT on 07/19/2020 that revealed high-grade noninvasive urothelial carcinoma; 1 cm. There were no other bladder lesion identified and the muscularis propria was appreciated and not involved.    Cystoscopy on 07/30/2021 showed <1cm tumor on right bladder wall consistent with a small reoccurrence.    He underwent a TURBT on 08/19/2021. Intraoperative findings showed very small approximately 5 mm superficial appearing tumor on the right posterior bladder wall, no other tumors appreciated.  Unremarkable bilateral retrogrades.  Postprocedural gemcitabine.  Surgical pathology revealed polypoid fragment of reactive urothelial mucosa with lymphoid aggregate and negative malignancy.    He did undergo a 6-week course of induction BCG completed in 09/2020.  This was well-tolerated.   He is s/p BCG x3. His last instillation was on 10/31/2021.   S/p Urolift for urinary symptoms, .  Continues to have night time issues with urinating.  Tried flomax again but had issues with fatigue.     NED. A&Ox3.   No respiratory distress   Abd soft, NT, ND Normal phallus with bilateral descended testicles  Cystoscopy Procedure Note  Patient identification was confirmed, informed consent was obtained, and patient was prepped using Betadine solution.  Lidocaine jelly was administered per urethral meatus.     Pre-Procedure: - Inspection reveals a normal caliber ureteral meatus.  Procedure: The flexible cystoscope was introduced without difficulty - No urethral strictures/lesions are present. - Enlarged prostate with anterior channel at the level of the apex and bladder neck but coaptation in the mid prostate - Elevated bladder neck - Bilateral ureteral orifices  identified - Bladder mucosa  reveals no ulcers, tumors, or lesions - No bladder stones - No trabeculation  Retroflexion shows NED   Post-Procedure: - Patient tolerated the procedure well   Assessment/ Plan:  History of bladder cancer  - Cystoscopy showed NED - Continue surveillance with cysto 6 months  2. BPH with nocturia  Strongly encourage eval for OSA  Fu cysto in 6 mo  Vanna Scotland, MD

## 2023-09-09 ENCOUNTER — Encounter: Payer: Self-pay | Admitting: Emergency Medicine

## 2023-09-17 ENCOUNTER — Telehealth: Payer: Self-pay | Admitting: *Deleted

## 2023-09-17 ENCOUNTER — Other Ambulatory Visit: Payer: Self-pay | Admitting: *Deleted

## 2023-09-17 DIAGNOSIS — Z87891 Personal history of nicotine dependence: Secondary | ICD-10-CM

## 2023-09-17 DIAGNOSIS — F17210 Nicotine dependence, cigarettes, uncomplicated: Secondary | ICD-10-CM

## 2023-09-17 DIAGNOSIS — Z122 Encounter for screening for malignant neoplasm of respiratory organs: Secondary | ICD-10-CM

## 2023-09-17 NOTE — Telephone Encounter (Signed)
 Lung Cancer Screening Narrative/Criteria Questionnaire (Cigarette Smokers Only- No Cigars/Pipes/vapes)   Christian Pena   SDMV:09/29/23 9:30- Vernona Rieger                                           [birth year]              LDCT: 09/30/23 9:00- MCM    72 y.o.   Phone: [phone]  Lung Screening Narrative (confirm age 40-77 yrs Medicare / 50-80 yrs Private pay insurance)   Insurance information:BCBS MCR   Referring Provider:Gauger   This screening involves an initial phone call with a team member from our program. It is called a shared decision making visit. The initial meeting is required by insurance and Medicare to make sure you understand the program. This appointment takes about 15-20 minutes to complete. The CT scan will completed at a separate date/time. This scan takes about 5-10 minutes to complete and you may eat and drink before and after the scan.  Criteria questions for Lung Cancer Screening:   Are you a current or former smoker? Current Age began smoking: 61   If you are a former smoker, what year did you quit smoking? (within 15 yrs)   To calculate your smoking history, I need an accurate estimate of how many packs of cigarettes you smoked per day and for how many years. (Not just the number of PPD you are now smoking)   Years smoking 54 x Packs per day 1 = Pack years 54   (at least 20 pack yrs)   (Make sure they understand that we need to know how much they have smoked in the past, not just the number of PPD they are smoking now)  Do you have a personal history of cancer?  Yes - (type and when diagnosed - 5 yrs cancer free) Bladder- 2021 surgery for removal    Do you have a family history of cancer? Yes  (cancer type and and relative) sister (?) Brother (prostate)  Are you coughing up blood?  No  Have you had unexplained weight loss of 15 lbs or more in the last 6 months? No  It looks like you meet all criteria.     Additional information: N/A

## 2023-09-29 ENCOUNTER — Encounter: Payer: Self-pay | Admitting: Physician Assistant

## 2023-09-29 ENCOUNTER — Ambulatory Visit: Admitting: Physician Assistant

## 2023-09-29 DIAGNOSIS — F17210 Nicotine dependence, cigarettes, uncomplicated: Secondary | ICD-10-CM | POA: Diagnosis not present

## 2023-09-29 NOTE — Patient Instructions (Signed)

## 2023-09-29 NOTE — Progress Notes (Signed)
 Virtual Visit via Telephone Note  I connected with Christian Pena on 09/29/23 at  9:54 AM by telephone and verified that I am speaking with the correct person using two identifiers.  Location: Patient: home Provider: working virtually from home   I discussed the limitations, risks, security and privacy concerns of performing an evaluation and management service by telephone and the availability of in person appointments. I also discussed with the patient that there may be a patient responsible charge related to this service. The patient expressed understanding and agreed to proceed.     Shared Decision Making Visit Lung Cancer Screening Program [phone])   Eligibility: Age 73 Pack Years Smoking History Calculation 33 (# packs/per year x # years smoked) Recent History of coughing up blood  No Unexplained weight loss? No ( >Than 15 pounds within the last 6 months ) Prior History Lung / other cancer No (Diagnosis within the last 5 years already requiring surveillance chest CT Scans). Smoking Status Current Smoker  Visit Components: Discussion included one or more decision making aids. Yes Discussion included risk/benefits of screening. Yes Discussion included potential follow up diagnostic testing for abnormal scans. Yes Discussion included meaning and risk of over diagnosis. Yes Discussion included meaning and risk of False Positives. Yes Discussion included meaning of total radiation exposure. Yes  Counseling Included: Importance of adherence to annual lung cancer LDCT screening. Yes Impact of comorbidities on ability to participate in the program. Yes Ability and willingness to under diagnostic treatment: Yes  Smoking Cessation Counseling: Current Smokers:  Discussed importance of smoking cessation. Yes Information about tobacco cessation classes and interventions provided to patient. Yes Symptomatic Patient. No Diagnosis Code: Tobacco Use Z72.0 Asymptomatic Patient  Yes  Counseling (Intermediate counseling: > three minutes counseling) J1884 Information about tobacco cessation classes and interventions provided to patient. Yes Written Order for Lung Cancer Screening with LDCT placed in Epic. Yes (CT Chest Lung Cancer Screening Low Dose W/O CM) ZYS0630 Z12.2-Screening of respiratory organs Z87.891-Personal history of nicotine dependence   I have spent 25 minutes of face to face/ virtual visit  time with the patient discussing the risks and benefits of lung cancer screening. We discussed the above noted topics. We paused at intervals to allow for questions to be asked and answered to ensure understanding.We discussed that the single most powerful action that anyone can take to decrease their risk of developing lung cancer is to quit smoking.  We discussed options for tools to aid in quitting smoking including nicotine replacement therapy, non-nicotine medications, support groups, Quit Smart classes, and behavior modification. We discussed that often times setting smaller, more achievable goals, such as eliminating 1 cigarette a day for a week and then 2 cigarettes a day for a week can be helpful in slowly decreasing the number of cigarettes smoked. I provided  them  with smoking cessation  information  with contact information for community resources, classes, free nicotine replacement therapy, and access to mobile apps, text messaging, and on-line smoking cessation help. I have also provided  them  the office contact information in the event they have any questions. We discussed the time and location of the scan, and that either Abigail Miyamoto RN, Karlton Lemon, RN  or I will call / send a letter with the results within 24-72 hours of receiving them. The patient verbalized understanding of all of  the above and had no further questions upon leaving the office. They have my contact information in the event they have any further  questions.  I spent 3 minutes counseling  on smoking cessation and the health risks of continued tobacco abuse.  I explained to the patient that there has been a high incidence of coronary artery disease noted on these exams. I explained that this is a non-gated exam therefore degree or severity cannot be determined. This patient is on statin therapy. I have asked the patient to follow-up with their PCP regarding any incidental finding of coronary artery disease and management with diet or medication as their PCP  feels is clinically indicated. The patient verbalized understanding of the above and had no further questions upon completion of the visit.    Darcella Gasman Jeiry Birnbaum, PA-C

## 2023-09-30 ENCOUNTER — Ambulatory Visit
Admission: RE | Admit: 2023-09-30 | Discharge: 2023-09-30 | Disposition: A | Discharge status: Home / Self Care | Source: Ambulatory Visit | Attending: Acute Care | Admitting: Acute Care

## 2023-09-30 DIAGNOSIS — Z122 Encounter for screening for malignant neoplasm of respiratory organs: Secondary | ICD-10-CM | POA: Insufficient documentation

## 2023-09-30 DIAGNOSIS — Z87891 Personal history of nicotine dependence: Secondary | ICD-10-CM | POA: Diagnosis present

## 2023-09-30 DIAGNOSIS — F17210 Nicotine dependence, cigarettes, uncomplicated: Secondary | ICD-10-CM | POA: Insufficient documentation

## 2023-11-09 ENCOUNTER — Other Ambulatory Visit: Payer: Self-pay

## 2023-11-09 DIAGNOSIS — F17210 Nicotine dependence, cigarettes, uncomplicated: Secondary | ICD-10-CM

## 2023-11-09 DIAGNOSIS — Z87891 Personal history of nicotine dependence: Secondary | ICD-10-CM

## 2023-11-09 DIAGNOSIS — Z122 Encounter for screening for malignant neoplasm of respiratory organs: Secondary | ICD-10-CM

## 2023-11-17 ENCOUNTER — Emergency Department

## 2023-11-17 ENCOUNTER — Encounter: Payer: Self-pay | Admitting: Emergency Medicine

## 2023-11-17 ENCOUNTER — Emergency Department
Admission: EM | Admit: 2023-11-17 | Discharge: 2023-11-17 | Disposition: A | Discharge status: Home / Self Care | Attending: Emergency Medicine | Admitting: Emergency Medicine

## 2023-11-17 DIAGNOSIS — W19XXXA Unspecified fall, initial encounter: Secondary | ICD-10-CM

## 2023-11-17 DIAGNOSIS — S79912A Unspecified injury of left hip, initial encounter: Secondary | ICD-10-CM | POA: Diagnosis present

## 2023-11-17 DIAGNOSIS — M545 Low back pain, unspecified: Secondary | ICD-10-CM | POA: Insufficient documentation

## 2023-11-17 DIAGNOSIS — S0990XA Unspecified injury of head, initial encounter: Secondary | ICD-10-CM | POA: Insufficient documentation

## 2023-11-17 DIAGNOSIS — W06XXXA Fall from bed, initial encounter: Secondary | ICD-10-CM | POA: Insufficient documentation

## 2023-11-17 NOTE — Discharge Instructions (Addendum)
 Fortunately your imaging did not show any broken bones, dislocations or brain bleeding.  Thank you for choosing us  for your health care today!  Please see your primary doctor this week for a follow up appointment.   If you have any new, worsening, or unexpected symptoms call your doctor right away or come back to the emergency department for reevaluation.  It was my pleasure to care for you today.   Arron Large Margery Sheets, MD

## 2023-11-17 NOTE — ED Triage Notes (Signed)
 Pt presents to the ED via POV with complaints of L hip pain after rolling out of the bed tonight. Endorses 5/10 pain and took Tylenol  PTA. Denies hitting his head, nor LOC. A&Ox4 at this time. Denies CP or SOB.

## 2023-11-17 NOTE — ED Provider Notes (Signed)
 [facility] Provider Note    Event Date/Time   First MD Initiated Contact with Patient 11/17/23 [phone]     (approximate)   History   Fall   HPI  Christian Pena is a 73 y.o. male   He rolled out of bed tonight while having a dream, he frequently acts out his dreams, he fell off of his bed hit his head and injured his left hip/buttock.  He did not lose consciousness.  Does not take blood thinners.  Was ambulatory at home.   Independent Historian contributed to assessment above: His wife is at bedside to corroborate information past medical history as above    Physical Exam   Triage Vital Signs: ED Triage Vitals  Encounter Vitals Group     BP 11/17/23 0402 107/82     Systolic BP Percentile --      Diastolic BP Percentile --      Pulse Rate 11/17/23 0402 (!) 59     Resp 11/17/23 0402 18     Temp 11/17/23 0402 97.7 F (36.5 C)     Temp Source 11/17/23 0402 Oral     SpO2 11/17/23 0402 97 %     Weight 11/17/23 0353 220 lb (99.8 kg)     Height 11/17/23 0353 6' (1.829 m)     Head Circumference --      Peak Flow --      Pain Score 11/17/23 0353 5     Pain Loc --      Pain Education --      Exclude from Growth Chart --     Most recent vital signs: Vitals:   11/17/23 0500 11/17/23 0540  BP: 137/67 128/83  Pulse: (!) 53 (!) 49  Resp:  16  Temp:  98 F (36.7 C)  SpO2: 99% 96%    General: Awake, no distress.  CV:  Good peripheral perfusion.  Resp:  Normal effort.  Abd:  No distention.  Other:  Well-appearing no acute distress, bradycardic otherwise vital signs normal.  There is tenderness to palpation of the left upper buttock.  Normal range of motion.  No external signs of trauma.  Ambulatory with steady gait.  Neurovascular intact.  Head to toe examination does not show any other signs of trauma, head atraumatic, CT and L-spine without deformity or tenderness, palpation of the chest wall abdomen and other extremities revealed no signs of  trauma.   ED Results / Procedures / Treatments   Labs (all labs ordered are listed, but only abnormal results are displayed) Labs Reviewed - No data to display  RADIOLOGY I independently reviewed and interpreted CT head and I see no obvious bleeding or midline shift I also reviewed radiologist's formal read.   PROCEDURES:  Critical Care performed: No  Procedures  MEDICATIONS ORDERED IN ED: Medications - No data to display  IMPRESSION / MDM / ASSESSMENT AND PLAN / ED COURSE  I reviewed the triage vital signs and the nursing notes.                              Patient's presentation is most consistent with acute presentation with potential threat to life or bodily function.  Differential diagnosis includes, but is not limited to, blunt traumatic injury including skull fracture ICH hip fracture dislocation contusion   The patient is on the cardiac monitor to evaluate for evidence of arrhythmia and/or significant heart rate changes.  MDM:    73 year old man who fell off of his bed and injured his head and hip.  CT of the head shows no skull fracture or ICH, fortunately and hip x-ray is atraumatic as well.  Given his overall well appearance, stable gait, no other signs of injury, and normal imaging, plan will be for discharge close PMD follow-up.       FINAL CLINICAL IMPRESSION(S) / ED DIAGNOSES   Final diagnoses:  Fall, initial encounter  Injury of head, initial encounter  Hip injury, left, initial encounter     Rx / DC Orders   ED Discharge Orders     None        Note:  This document was prepared using Dragon voice recognition software and may include unintentional dictation errors.    Buell Carmin, MD 11/17/23 [phone]

## 2023-12-22 ENCOUNTER — Encounter: Payer: Self-pay | Admitting: Urology

## 2024-02-23 ENCOUNTER — Ambulatory Visit: Admitting: Urology

## 2024-02-23 ENCOUNTER — Encounter: Payer: Self-pay | Admitting: Urology

## 2024-02-23 ENCOUNTER — Other Ambulatory Visit: Payer: Medicare Other | Admitting: Urology

## 2024-02-23 VITALS — BP 144/88 | HR 63 | Ht 74.0 in | Wt 220.0 lb

## 2024-02-23 DIAGNOSIS — R310 Gross hematuria: Secondary | ICD-10-CM

## 2024-02-23 DIAGNOSIS — Z8551 Personal history of malignant neoplasm of bladder: Secondary | ICD-10-CM

## 2024-02-23 LAB — URINALYSIS, COMPLETE
Bilirubin, UA: NEGATIVE
Glucose, UA: NEGATIVE
Ketones, UA: NEGATIVE
Leukocytes,UA: NEGATIVE
Nitrite, UA: NEGATIVE
Protein,UA: NEGATIVE
RBC, UA: NEGATIVE
Specific Gravity, UA: 1.02 (ref 1.005–1.030)
Urobilinogen, Ur: 1 mg/dL (ref 0.2–1.0)
pH, UA: 6 (ref 5.0–7.5)

## 2024-02-23 LAB — MICROSCOPIC EXAMINATION

## 2024-02-23 MED ORDER — LIDOCAINE HCL URETHRAL/MUCOSAL 2 % EX GEL
1.0000 | Freq: Once | CUTANEOUS | Status: AC
Start: 2024-02-23 — End: 2024-02-23
  Administered 2024-02-23 (×2): 1 via URETHRAL

## 2024-02-23 NOTE — Progress Notes (Signed)
   02/23/24  CC:  Chief Complaint  Patient presents with   Cysto   Urologic history:  1.  Urothelial carcinoma bladder Previously followed by Dr. Penne Ta high-grade urothelial carcinoma TURBT 07/19/2020 1 cm bladder neck tumor Induction BCG completed 10/09/2020 Maintenance BCG x 3.  10/31/2021 TURBT 08/19/2021 <1 cm tumor right lateral wall; pathology polypoid fragment of reactive urothelial mucosa with lymphoid aggregate  2.  BPH with LUTS UroLift 09/01/2022    HPI: No complaints since last visit.  Denies dysuria, gross hematuria.  UA dipstick/microscopy negative  Blood pressure (!) 144/88, pulse 63, height 6' 2 (1.88 m), weight 220 lb (99.8 kg), SpO2 98%.   Cystoscopy Procedure Note  Patient identification was confirmed, informed consent was obtained, and patient was prepped using Betadine solution.  Lidocaine  jelly was administered per urethral meatus.     Pre-Procedure: - Inspection reveals a normal caliber urethral meatus.  Procedure: The flexible cystoscope was introduced without difficulty - No urethral strictures/lesions are present. - Moderate lateral lobe enlargement prostate  - Elevated bladder neck - Bilateral ureteral orifices identified - Bladder mucosa  reveals no ulcers, tumors, or lesions - No bladder stones - Mild trabeculation  Retroflexion shows no tumor or intravesical median lobe   Post-Procedure: - Patient tolerated the procedure well  Assessment/ Plan:  1.  History of bladder cancer No recurrent tumor on cystoscopy Follow-up surveillance cystoscopy 6 months We discussed if 5 years tumor free surveillance interval will be increased to annually    Glendia JAYSON Barba, MD

## 2024-02-24 ENCOUNTER — Other Ambulatory Visit: Admitting: Urology

## 2024-03-25 ENCOUNTER — Ambulatory Visit: Admission: EM | Admit: 2024-03-25 | Discharge: 2024-03-25 | Disposition: A | Discharge status: Home / Self Care

## 2024-03-25 ENCOUNTER — Other Ambulatory Visit: Payer: Self-pay | Admitting: General Surgery

## 2024-03-25 ENCOUNTER — Other Ambulatory Visit: Payer: Self-pay

## 2024-03-25 ENCOUNTER — Encounter: Payer: Self-pay | Admitting: Emergency Medicine

## 2024-03-25 ENCOUNTER — Emergency Department

## 2024-03-25 ENCOUNTER — Emergency Department
Admission: EM | Admit: 2024-03-25 | Discharge: 2024-03-25 | Disposition: A | Discharge status: Home / Self Care | Attending: Emergency Medicine | Admitting: Emergency Medicine

## 2024-03-25 DIAGNOSIS — I10 Essential (primary) hypertension: Secondary | ICD-10-CM | POA: Diagnosis not present

## 2024-03-25 DIAGNOSIS — X58XXXA Exposure to other specified factors, initial encounter: Secondary | ICD-10-CM | POA: Diagnosis not present

## 2024-03-25 DIAGNOSIS — T189XXD Foreign body of alimentary tract, part unspecified, subsequent encounter: Secondary | ICD-10-CM

## 2024-03-25 DIAGNOSIS — T189XXA Foreign body of alimentary tract, part unspecified, initial encounter: Secondary | ICD-10-CM | POA: Diagnosis present

## 2024-03-25 DIAGNOSIS — Z955 Presence of coronary angioplasty implant and graft: Secondary | ICD-10-CM | POA: Diagnosis not present

## 2024-03-25 NOTE — ED Triage Notes (Signed)
 While at the dentist office this morning, swallowed an implant screw.  AAOx3.  Skin warm and dry. NAD

## 2024-03-25 NOTE — Consult Note (Signed)
 Patient ID: Christian Pena, male   DOB: [birth year], 73 y.o.   MRN: [id number] CC: Ingested Dental screw History of Present Illness Christian Pena is a 73 y.o. male with past medical history as below who presents in consultation for ingested dental crown.  The patient reports that he was having dental work this morning when he swallowed one of the screws that they were using.  He reports that initially had a little bit of pain right over his sternum but he denies any pain at this time.  He denies any nausea or vomiting.  He denies any fevers or feelings of distention.  He reports that he has been passing gas throughout the morning.  He has not had a bowel movement since he ingested it.  Past Medical History Past Medical History:  Diagnosis Date   AAA (abdominal aortic aneurysm) (HCC) 08/18/2019   a.) AAA duplex 08/18/2019 --> measured 3.1 cm. b.) TTE 04/22/2021: interval increase in size to 3.46 cm x 3.70 cm.   Aortic atherosclerosis (HCC)    BPH with urinary obstruction    Coronary artery disease 05/08/2021   a.) LHC 05/08/2021: EF 45-50%; 55% pRCA, 95% RV branch, 30% dRCA, 100% OM2, 35% dLCx, 40% mLM, 30% RI, 100% pLAD, 100% mLAD; consult CVTS. b.) 3v CABG 05/29/2021: LIMA-LAD, SVG-LPL-RI (sequenced)   Dyspnea on exertion    Erectile dysfunction    Frequent PVCs    GERD (gastroesophageal reflux disease)    H/O orthostatic hypotension    History of 2019 novel coronavirus disease (COVID-19) 03/2021   History of ETOH abuse    History of tobacco abuse    HLD (hyperlipidemia)    Hypertension    Ischemic cardiomyopathy    a.) TTE 04/22/2021: mild LV dysfunction with EF 40%; apical anterior, anteroseptal, inferior, and inferoseptal akinesis; triv PR, mild MR/TR; G1DD. b.) TTE 08/08/2021: moderate LV dysfunction with mild LVH; EF 35%; mild LA enlargement, triv AR/PR, mild MR, mod TR.   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   Vitamin D deficiency        Past Surgical History:  Procedure Laterality Date   COLONOSCOPY WITH PROPOFOL  N/A 04/20/2023   Procedure: COLONOSCOPY WITH PROPOFOL ;  Surgeon: Maryruth Ole DASEN, MD;  Location: [facility];  Service: Endoscopy;  Laterality: N/A;   CORONARY ARTERY BYPASS GRAFT N/A 05/29/2021   Procedure: 3v CORONARTY ARTERY BYPASS GRAFT (LIMA-LAD, SVG-LPL-RI); Location: [facility]; Surgeon: Bernardino Lares, MD   CYSTOSCOPY W/ RETROGRADES Bilateral 07/19/2020   Procedure: CYSTOSCOPY WITH RETROGRADE PYELOGRAM;  Surgeon: Penne Knee, MD;  Location: [facility];  Service: Urology;  Laterality: Bilateral;   CYSTOSCOPY W/ RETROGRADES Bilateral 08/19/2021   Procedure: CYSTOSCOPY WITH RETROGRADE PYELOGRAM;  Surgeon: Penne Knee, MD;  Location: [facility];  Service: Urology;  Laterality: Bilateral;   CYSTOSCOPY WITH INSERTION OF UROLIFT N/A 09/01/2022   Procedure: CYSTOSCOPY WITH INSERTION OF UROLIFT;  Surgeon: Penne Knee, MD;  Location: [facility];  Service: Urology;  Laterality: N/A;   LEFT HEART CATH AND CORONARY ANGIOGRAPHY Left 05/08/2021   Procedure: LEFT HEART CATH AND CORONARY ANGIOGRAPHY;  Surgeon: Hester Wolm PARAS, MD;  Location: [facility];  Service: Cardiovascular;  Laterality: Left;  POLYPECTOMY  04/20/2023   Procedure: POLYPECTOMY;  Surgeon: Maryruth Ole DASEN, MD;  Location: [facility];  Service: Endoscopy;;   TONSILLECTOMY     TRANSURETHRAL RESECTION OF BLADDER TUMOR N/A 08/19/2021   Procedure: TRANSURETHRAL RESECTION OF BLADDER TUMOR (TURBT) WITH INSTILLATION OF GEMCITABINE ;  Surgeon: Penne Knee, MD;  Location: [facility];  Service: Urology;  Laterality: N/A;   TRANSURETHRAL RESECTION OF BLADDER TUMOR WITH  MITOMYCIN -C N/A 07/19/2020   Procedure: TRANSURETHRAL RESECTION OF BLADDER TUMOR WITH Gemcitabine ;  Surgeon: Penne Knee, MD;  Location: [facility];  Service: Urology;  Laterality: N/A;    No Known Allergies  No current facility-administered medications for this encounter.   Current Outpatient Medications  Medication Sig Dispense Refill   aspirin  81 MG EC tablet Take 81 mg by mouth daily.     meloxicam (MOBIC) 15 MG tablet Take 15 mg by mouth daily.     metoprolol succinate (TOPROL-XL) 25 MG 24 hr tablet Take 12.5 mg by mouth every morning.     Multiple Vitamins-Minerals (MULTIVITAMIN WITH MINERALS) tablet Take 1 tablet by mouth daily. Centrum silver Adult 50 +     omeprazole (PRILOSEC) 20 MG capsule Take 20 mg by mouth every morning.     rosuvastatin (CRESTOR) 40 MG tablet Take 40 mg by mouth every morning.     valACYclovir (VALTREX) 1000 MG tablet Take 1,000 mg by mouth 2 (two) times daily as needed.     valsartan (DIOVAN) 80 MG tablet Take 80 mg by mouth daily.      Family History History reviewed. No pertinent family history.     Social History Social History   Tobacco Use   Smoking status: Former    Current packs/day: 0.00    Average packs/day: 1 pack/day for 50.0 years (50.0 ttl pk-yrs)    Types: Cigarettes    Start date: 04/1972    Quit date: 04/2022    Years since quitting: 1.9   Smokeless tobacco: Never  Vaping Use   Vaping status: Never Used  Substance Use Topics   Alcohol use: Yes    Alcohol/week: 4.0 - 5.0 standard drinks of alcohol    Types: 4 - 5 Cans of beer per week    Comment: 2 beers weekly   Drug use: Never        ROS Full ROS of systems performed and is otherwise negative there than what is stated in the HPI  Physical Exam Blood pressure (!) 143/79, pulse (!) 59, temperature 97.6 F (36.4 C), temperature source Oral, resp. rate 16, height 6' 2 (1.88 m), weight 99.8 kg, SpO2 98%.  Alert and oriented x 3, normal work of breathing on room  air, regular rate and rhythm, abdomen soft, nontender nondistended, previous CABG scar healing well as well as just His drain sites that have healed up well.,  Moving all extremities spontaneously  Data Reviewed I independently reviewed his x-ray.  There does look like a lateral foreign body that is likely in the mid small bowel.  It does look like it has transverse the pylorus although it does not reach the level of the colon.  I have personally reviewed the patient's imaging and medical records.    Assessment    I discussed with the patient that the foreign body has gone into the small bowel and it is likely that he will be able to pass it.  Given that he is having no abdominal pain or signs of obstipation and obstruction I do not think he warrants  any admission.  I did discuss with him warning signs of perforation including increased plain, bloating, fevers and chills.  I also discussed with him warning signs of obstruction including obstipation, nausea and vomiting.  We will plan to see him next week and I will order an x-ray for him to see if it passes.  I also discussed with him that if he sees a hole in his stool then he can cancel the appointment.  A total of 40 minutes was spent reviewing the patient's chart, performing a history and physical and discussing treatment options with the patient.  Christian Pena 03/25/2024, 2:19 PM

## 2024-03-25 NOTE — Discharge Instructions (Signed)
 Your exam is normal and reassuring at this time.  No signs of any acute distress related to your swallowed foreign body.  Follow-up with a general surgeon as discussed, for reevaluation.  Return to the ED if necessary.

## 2024-03-25 NOTE — ED Provider Notes (Signed)
 73 year old male presents for swallowing for body while having a dental procedure done at [facility] in [place].  Patient states the dentist office sent him to the urgent care to have imaging performed.  He shows me a picture of a Dremel with a long point to it.  He says they are worried about it possibly perforating.  Explained to patient that we could perform imaging but have no way of retreating the foreign body and it is high risk for perforation.  I explained that he would be better served to go to the emergency department.  Patient is not having any symptoms at the time.  Denies choking, cough, chest pain, throat pain, difficulty breathing, wheezing, abdominal pain or bleeding.  Patient will proceed to [facility] and his wife will be him there.  He is leaving in stable condition.   Christian Jolan NOVAK, PA-C 03/25/24 1242

## 2024-03-25 NOTE — ED Provider Notes (Signed)
 [facility] Emergency Department Provider Note     Event Date/Time   First MD Initiated Contact with Patient 03/25/24 1104     (approximate)   History   Swallowed Foreign Body   HPI  Christian Pena is a 73 y.o. male with a history of HTN, GERD, CABG, and OA, presents to the ED for evaluation of intermittent swallowed foreign body.  Patient presents from his dentist office, where he was having dental implants placed.  Apparently one of the dental implants screws was dropped down the patient's oropharynx.  He presents to the ED at their advice for evaluation of a swallowed foreign body.  He denies any choking, coughing, or emesis.  Patient does endorse swallowing for water  after the incident with no difficulty or subsequent emesis.  He presents to the ED in no acute distress for evaluation.  Physical Exam   Triage Vital Signs: ED Triage Vitals  Encounter Vitals Group     BP 03/25/24 1048 (!) 143/79     Girls Systolic BP Percentile --      Girls Diastolic BP Percentile --      Boys Systolic BP Percentile --      Boys Diastolic BP Percentile --      Pulse Rate 03/25/24 1048 (!) 59     Resp 03/25/24 1048 16     Temp 03/25/24 1048 97.6 F (36.4 C)     Temp Source 03/25/24 1048 Oral     SpO2 03/25/24 1048 98 %     Weight 03/25/24 1049 220 lb 0.3 oz (99.8 kg)     Height 03/25/24 1058 6' 2 (1.88 m)     Head Circumference --      Peak Flow --      Pain Score 03/25/24 1049 0     Pain Loc --      Pain Education --      Exclude from Growth Chart --     Most recent vital signs: Vitals:   03/25/24 1048  BP: (!) 143/79  Pulse: (!) 59  Resp: 16  Temp: 97.6 F (36.4 C)  SpO2: 98%    General Awake, no distress. NAD HEENT NCAT. PERRL. EOMI. No rhinorrhea. Mucous membranes are moist.  CV:  Good peripheral perfusion. RRR RESP:  Normal effort. CTA ABD:  No distention.  Soft and nontender.  No rebound, guarding, or rigidity noted.   ED Results /  Procedures / Treatments   Labs (all labs ordered are listed, but only abnormal results are displayed) Labs Reviewed - No data to display   EKG   RADIOLOGY  I personally viewed and evaluated these images as part of my medical decision making, as well as reviewing the written report by the radiologist.  ED Provider Interpretation: Italic foreign body consistent with dental implant post noted over mid abdomen  DG Abd 1 View Result Date: 03/25/2024 EXAM: 1 VIEW XRAY OF THE ABDOMEN 03/25/2024 11:24:00 AM COMPARISON: None available. CLINICAL HISTORY: Eval foreign body. Patient reports while at the dentist office this morning, he swallowed an implant screw. FINDINGS: BOWEL: Nonobstructive bowel gas pattern. SOFT TISSUES: No opaque urinary calculi. 25 mm Metallic foreign body overlying the mid abdomen compatible with the given history of a screw. BONES: No acute osseous abnormality. Brachytherapy seeds overlie the prostate. Sternotomy wires and surgical clips overlying the mediastinum. IMPRESSION: 1. 25 mm metallic foreign body overlying the mid abdomen, compatible with the given history of a swallowed implant screw. Electronically  signed by: Dorethia Molt MD 03/25/2024 11:29 AM EDT RP Workstation: [id number]   DG Chest 2 View Result Date: 03/25/2024 EXAM: 2 VIEW(S) XRAY OF THE CHEST 03/25/2024 11:24:00 AM COMPARISON: None available. CLINICAL HISTORY: Swallowed metallic FB. Eval foreign body. Patient reports while at the dentist office this morning, he swallowed an implant screw. FINDINGS: LUNGS AND PLEURA: No focal pulmonary opacity. No pulmonary edema. No pleural effusion. No pneumothorax. HEART AND MEDIASTINUM: No acute abnormality of the cardiac and mediastinal silhouettes. Sternotomy wires noted. CABG markers noted. Aortic atherosclerosis. BONES AND SOFT TISSUES: No acute osseous abnormality. Vertebral endplate spurring at multiple levels in mid and lower thoracic spine. IMPRESSION: 1. No radiopaque  foreign body identified. Electronically signed by: Dorethia Molt MD 03/25/2024 11:28 AM EDT RP Workstation: [id number]     PROCEDURES:  Critical Care performed: No  Procedures   MEDICATIONS ORDERED IN ED: Medications - No data to display   IMPRESSION / MDM / ASSESSMENT AND PLAN / ED COURSE  I reviewed the triage vital signs and the nursing notes.                              Differential diagnosis includes, but is not limited to, swallowed foreign body, reflux, hiatal hernia, esophageal foreign body, SBO  Patient's presentation is most consistent with acute complicated illness / injury requiring diagnostic workup.   ----------------------------------------- 11:59 AM on 03/25/2024 ----------------------------------------- S/W Dr. Marinda ([facility]): he will come to the ED to evaluate the patient at bedside.  He discussed management options with the patient and his wife, ultimately deciding that the foreign body was stable since he was in no acute distress, and would likely pass without difficulty.  He will follow the patient in the office.  Patient's diagnosis is consistent with swallowed foreign body.  Patient presents with report of swallowed dental implant screw.  The metallic foreign body is visualized on upright abdominal film in the mid abdomen, likely representing a small intestine.  Patient again is stable throughout his course in the ED.  Patient and his wife are agreeable to the plan for expectant management and will be followed by general surgery as discussed.  Patient is to follow up with Dr. Marinda as scheduled, as needed or otherwise directed. Patient is given ED precautions to return to the ED for any worsening or new symptoms.   FINAL CLINICAL IMPRESSION(S) / ED DIAGNOSES   Final diagnoses:  Swallowed foreign body, initial encounter     Rx / DC Orders   ED Discharge Orders     None        Note:  This document was prepared using Dragon voice recognition  software and may include unintentional dictation errors.    Loyd Candida LULLA Aldona, PA-C 03/25/24 1256    Ernest Ronal BRAVO, MD 03/25/24 1328

## 2024-03-31 ENCOUNTER — Ambulatory Visit
Admission: RE | Admit: 2024-03-31 | Discharge: 2024-03-31 | Disposition: A | Discharge status: Home / Self Care | Source: Ambulatory Visit | Attending: General Surgery | Admitting: General Surgery

## 2024-03-31 ENCOUNTER — Encounter: Payer: Self-pay | Admitting: General Surgery

## 2024-03-31 ENCOUNTER — Ambulatory Visit (INDEPENDENT_AMBULATORY_CARE_PROVIDER_SITE_OTHER): Payer: Self-pay | Admitting: General Surgery

## 2024-03-31 VITALS — BP 154/110 | HR 85 | Temp 97.7°F | Ht 72.0 in | Wt 225.2 lb

## 2024-03-31 DIAGNOSIS — T189XXD Foreign body of alimentary tract, part unspecified, subsequent encounter: Secondary | ICD-10-CM | POA: Diagnosis present

## 2024-04-04 NOTE — Progress Notes (Addendum)
 Outpatient Surgical Follow Up    Christian Pena is an 73 y.o. male.   Chief Complaint  Patient presents with   New Patient (Initial Visit)    Swallowed dental screw    HPI: The patient returns to me after being seen in the emergency department.  He was undergoing a dental procedure when a dental screw was inadvertently dropped in the back of his mouth and he swallowed it.  At that time last week the x-ray showed that it had gone into the small bowel.  In the intervening time.  He reports feeling fine.  He denies any abdominal pain, nausea or vomiting.  He reports that he is having normal bowel movements and has been using some MiraLAX to help with bowel movements.  He had a x-ray today that independently reviewed.  It looks like this screw is still within his abdomen and likely at the ileocecal valve.  Past Medical History:  Diagnosis Date   AAA (abdominal aortic aneurysm) (HCC) 08/18/2019   a.) AAA duplex 08/18/2019 --> measured 3.1 cm. b.) TTE 04/22/2021: interval increase in size to 3.46 cm x 3.70 cm.   Aortic atherosclerosis (HCC)    BPH with urinary obstruction    Coronary artery disease 05/08/2021   a.) LHC 05/08/2021: EF 45-50%; 55% pRCA, 95% RV branch, 30% dRCA, 100% OM2, 35% dLCx, 40% mLM, 30% RI, 100% pLAD, 100% mLAD; consult CVTS. b.) 3v CABG 05/29/2021: LIMA-LAD, SVG-LPL-RI (sequenced)   Dyspnea on exertion    Erectile dysfunction    Frequent PVCs    GERD (gastroesophageal reflux disease)    H/O orthostatic hypotension    History of 2019 novel coronavirus disease (COVID-19) 03/2021   History of ETOH abuse    History of tobacco abuse    HLD (hyperlipidemia)    Hypertension    Ischemic cardiomyopathy    a.) TTE 04/22/2021: mild LV dysfunction with EF 40%; apical anterior, anteroseptal, inferior, and inferoseptal akinesis; triv PR, mild MR/TR; G1DD. b.) TTE 08/08/2021: moderate LV dysfunction with mild LVH; EF 35%; mild LA enlargement, triv AR/PR, mild MR, mod TR.   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   Vitamin D deficiency     Past Surgical History:  Procedure Laterality Date   COLONOSCOPY WITH PROPOFOL  N/A 04/20/2023   Procedure: COLONOSCOPY WITH PROPOFOL ;  Surgeon: Maryruth Ole DASEN, MD;  Location: [facility];  Service: Endoscopy;  Laterality: N/A;   CORONARY ARTERY BYPASS GRAFT N/A 05/29/2021   Procedure: 3v CORONARTY ARTERY BYPASS GRAFT (LIMA-LAD, SVG-LPL-RI); Location: [facility]; Surgeon: Bernardino Lares, MD   CYSTOSCOPY W/ RETROGRADES Bilateral 07/19/2020   Procedure: CYSTOSCOPY WITH RETROGRADE PYELOGRAM;  Surgeon: Penne Knee, MD;  Location: [facility];  Service: Urology;  Laterality: Bilateral;   CYSTOSCOPY W/ RETROGRADES Bilateral 08/19/2021   Procedure: CYSTOSCOPY WITH RETROGRADE PYELOGRAM;  Surgeon: Penne Knee, MD;  Location: [facility];  Service: Urology;  Laterality: Bilateral;   CYSTOSCOPY WITH INSERTION OF UROLIFT N/A 09/01/2022   Procedure: CYSTOSCOPY WITH INSERTION OF UROLIFT;  Surgeon: Penne Knee, MD;  Location: [facility];  Service: Urology;  Laterality: N/A;   LEFT HEART CATH AND CORONARY ANGIOGRAPHY Left 05/08/2021   Procedure: LEFT HEART CATH AND  CORONARY ANGIOGRAPHY;  Surgeon: Hester Wolm PARAS, MD;  Location: [facility];  Service: Cardiovascular;  Laterality: Left;   POLYPECTOMY  04/20/2023   Procedure: POLYPECTOMY;  Surgeon: Maryruth Ole DASEN, MD;  Location: [facility];  Service: Endoscopy;;   TONSILLECTOMY     TRANSURETHRAL RESECTION OF BLADDER TUMOR N/A 08/19/2021   Procedure: TRANSURETHRAL RESECTION OF BLADDER TUMOR (TURBT) WITH INSTILLATION OF GEMCITABINE ;  Surgeon: Penne Knee, MD;   Location: [facility];  Service: Urology;  Laterality: N/A;   TRANSURETHRAL RESECTION OF BLADDER TUMOR WITH MITOMYCIN -C N/A 07/19/2020   Procedure: TRANSURETHRAL RESECTION OF BLADDER TUMOR WITH Gemcitabine ;  Surgeon: Penne Knee, MD;  Location: [facility];  Service: Urology;  Laterality: N/A;    History reviewed. No pertinent family history.  Social History:  reports that he quit smoking about 1 years ago. His smoking use included cigarettes. He started smoking about 52 years ago. He has a 50 pack-year smoking history. He has never used smokeless tobacco. He reports current alcohol use of about 4.0 - 5.0 standard drinks of alcohol per week. He reports that he does not use drugs.  Allergies: No Known Allergies  Medications reviewed.    ROS Full ROS performed and is otherwise negative other than what is stated in HPI   BP (!) 154/110   Pulse 85   Temp 97.7 F (36.5 C) (Oral)   Ht 6' (1.829 m)   Wt 225 lb 3.2 oz (102.2 kg)   SpO2 99%   BMI 30.54 kg/m   Physical Exam  Alert and oriented x 3, no work of breathing room air, regular rate and rhythm, abdomen soft, nontender nondistended   X-ray independently reviewed and it appears that the screws at the ileocecal valve.  Assessment/Plan:  1. Swallowed foreign body, subsequent encounter (Primary) Patient with retained foreign body after a dental procedure.  He is not having any symptoms.  His x-ray shows that the screw is still out the ileocecal valve.  I discussed with him that in the case studies are reviewed from swallowing of the same dental screw there was 1 that the patient had a x-ray that showed that the screw was still within the small bowel after a week but they waited another week and he was able to pass it.  I discussed with the patient that my concern is that the screws at the ileocecal valve but it still has a chance to pass through it.  If it does get to the colon we can potentially remove this endoscopically.  However,  he is still at risk of perforation.  I also discussed that the other option would be to go ahead and proceed with surgical exploration.  At this time because he is not having symptoms he wants to wait another week.  I discussed with him that if we wait another week that is fine and also reiterated the warning signs.  If the screw is still within the small bowel next week my recommendation would be to proceed with surgical exploration.  He understands this and we will order an x-ray for him next week. - DG Abd 2 Views; Future  A total of 30 minutes was spent reviewing the patient's interval history, performing history and physical and discussing treatment options with the patient  Jayson Endow, M.D. [facility]

## 2024-04-07 ENCOUNTER — Ambulatory Visit
Admission: RE | Admit: 2024-04-07 | Discharge: 2024-04-07 | Disposition: A | Discharge status: Home / Self Care | Source: Ambulatory Visit | Attending: General Surgery | Admitting: General Surgery

## 2024-04-07 ENCOUNTER — Ambulatory Visit
Admission: RE | Admit: 2024-04-07 | Discharge: 2024-04-07 | Disposition: A | Discharge status: Home / Self Care | Attending: General Surgery | Admitting: General Surgery

## 2024-04-07 ENCOUNTER — Encounter: Payer: Self-pay | Admitting: General Surgery

## 2024-04-07 ENCOUNTER — Ambulatory Visit (INDEPENDENT_AMBULATORY_CARE_PROVIDER_SITE_OTHER): Admitting: General Surgery

## 2024-04-07 VITALS — BP 102/60 | HR 64 | Ht 72.0 in | Wt 226.0 lb

## 2024-04-07 DIAGNOSIS — T189XXD Foreign body of alimentary tract, part unspecified, subsequent encounter: Secondary | ICD-10-CM | POA: Diagnosis present

## 2024-04-07 NOTE — Progress Notes (Signed)
 Outpatient Surgical Follow Up  04/07/2024  Christian Pena is an 73 y.o. male.   Chief Complaint  Patient presents with   Follow-up    HPI: Patient returns today after swallowing a dental screw.  He reports doing well.  Denies any abdominal pain.  He is having normal bowel movements without any pain.  Past Medical History:  Diagnosis Date   AAA (abdominal aortic aneurysm) 08/18/2019   a.) AAA duplex 08/18/2019 --> measured 3.1 cm. b.) TTE 04/22/2021: interval increase in size to 3.46 cm x 3.70 cm.   Aortic atherosclerosis    BPH with urinary obstruction    Coronary artery disease 05/08/2021   a.) LHC 05/08/2021: EF 45-50%; 55% pRCA, 95% RV branch, 30% dRCA, 100% OM2, 35% dLCx, 40% mLM, 30% RI, 100% pLAD, 100% mLAD; consult CVTS. b.) 3v CABG 05/29/2021: LIMA-LAD, SVG-LPL-RI (sequenced)   Dyspnea on exertion    Erectile dysfunction    Frequent PVCs    GERD (gastroesophageal reflux disease)    H/O orthostatic hypotension    History of 2019 novel coronavirus disease (COVID-19) 03/2021   History of ETOH abuse    History of tobacco abuse    HLD (hyperlipidemia)    Hypertension    Ischemic cardiomyopathy    a.) TTE 04/22/2021: mild LV dysfunction with EF 40%; apical anterior, anteroseptal, inferior, and inferoseptal akinesis; triv PR, mild MR/TR; G1DD. b.) TTE 08/08/2021: moderate LV dysfunction with mild LVH; EF 35%; mild LA enlargement, triv AR/PR, mild MR, mod TR.   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   Vitamin D deficiency     Past Surgical History:  Procedure Laterality Date   COLONOSCOPY WITH PROPOFOL  N/A 04/20/2023   Procedure: COLONOSCOPY WITH PROPOFOL ;   Surgeon: Maryruth Ole DASEN, MD;  Location: [facility];  Service: Endoscopy;  Laterality: N/A;   CORONARY ARTERY BYPASS GRAFT N/A 05/29/2021   Procedure: 3v CORONARTY ARTERY BYPASS GRAFT (LIMA-LAD, SVG-LPL-RI); Location: [facility]; Surgeon: Bernardino Lares, MD   CYSTOSCOPY W/ RETROGRADES Bilateral 07/19/2020   Procedure: CYSTOSCOPY WITH RETROGRADE PYELOGRAM;  Surgeon: Penne Knee, MD;  Location: [facility];  Service: Urology;  Laterality: Bilateral;   CYSTOSCOPY W/ RETROGRADES Bilateral 08/19/2021   Procedure: CYSTOSCOPY WITH RETROGRADE PYELOGRAM;  Surgeon: Penne Knee, MD;  Location: [facility];  Service: Urology;  Laterality: Bilateral;   CYSTOSCOPY WITH INSERTION OF UROLIFT N/A 09/01/2022   Procedure: CYSTOSCOPY WITH INSERTION OF UROLIFT;  Surgeon: Penne Knee, MD;  Location: [facility];  Service: Urology;  Laterality: N/A;   LEFT HEART CATH AND CORONARY ANGIOGRAPHY Left 05/08/2021   Procedure: LEFT HEART CATH AND CORONARY ANGIOGRAPHY;  Surgeon: Hester Wolm PARAS, MD;  Location: [facility];  Service: Cardiovascular;  Laterality: Left;   POLYPECTOMY  04/20/2023   Procedure: POLYPECTOMY;  Surgeon: Maryruth Ole DASEN, MD;  Location: [facility];  Service: Endoscopy;;   TONSILLECTOMY     TRANSURETHRAL RESECTION OF BLADDER TUMOR N/A 08/19/2021   Procedure: TRANSURETHRAL RESECTION OF BLADDER TUMOR (TURBT) WITH INSTILLATION OF GEMCITABINE ;  Surgeon: Penne Knee, MD;  Location: [facility];  Service: Urology;  Laterality: N/A;   TRANSURETHRAL RESECTION OF BLADDER TUMOR WITH MITOMYCIN -C N/A 07/19/2020   Procedure: TRANSURETHRAL RESECTION OF BLADDER TUMOR  WITH Gemcitabine ;  Surgeon: Penne Knee, MD;  Location: [facility];  Service: Urology;  Laterality: N/A;    History reviewed. No pertinent family history.  Social History:  reports that he quit smoking about 1 years ago. His smoking use included cigarettes. He started smoking about 52 years ago. He has a 50 pack-year smoking history. He  has never used smokeless tobacco. He reports current alcohol use of about 4.0 - 5.0 standard drinks of alcohol per week. He reports that he does not use drugs.  Allergies: No Known Allergies  Medications reviewed.    ROS Full ROS performed and is otherwise negative other than what is stated in HPI   BP 102/60   Pulse 64   Ht 6' (1.829 m)   Wt 226 lb (102.5 kg)   SpO2 99%   BMI 30.65 kg/m   Physical Exam Abdomen soft, nontender nondistended, moving all extremities   X-ray reviewed and there is no evidence of screw within the abdominal cavity.  He likely passed this  Assessment/Plan:  Patient who swallowed a dental screw during the procedure.  This was initially in his small bowel.  Repeat x-ray did show that it was still in the small bowel last week.  He has a repeat x-ray today that does not show the screw within the abdomen.  He likely passed this.  I discussed with him that he likely passed it.  Denies any abdominal pain.  He can follow-up with us  prn  A total of 20 minutes was spent reviewing the patient's chart, performing history and physical and discussing treatment options with the patient  Jayson Endow, M.D. [facility]

## 2024-04-07 NOTE — Patient Instructions (Signed)
 Please give our office a call if you have any questions or concerns

## 2024-06-22 ENCOUNTER — Encounter: Payer: Self-pay | Admitting: Urology

## 2024-08-23 ENCOUNTER — Other Ambulatory Visit: Admitting: Urology

## 2024-08-26 ENCOUNTER — Other Ambulatory Visit: Admitting: Urology

## 2024-10-04 ENCOUNTER — Ambulatory Visit
